# Patient Record
Sex: Female | Born: 1980 | Hispanic: No | Marital: Married | Smoking: Current every day smoker
Health system: Southern US, Community
[De-identification: ages and names within clinical notes are randomized; demographics above are authoritative.]

## PROBLEM LIST (undated history)

## (undated) ENCOUNTER — Inpatient Hospital Stay (HOSPITAL_COMMUNITY): Payer: Self-pay

## (undated) DIAGNOSIS — B999 Unspecified infectious disease: Secondary | ICD-10-CM

## (undated) DIAGNOSIS — I10 Essential (primary) hypertension: Secondary | ICD-10-CM

## (undated) DIAGNOSIS — R519 Headache, unspecified: Secondary | ICD-10-CM

## (undated) DIAGNOSIS — O139 Gestational [pregnancy-induced] hypertension without significant proteinuria, unspecified trimester: Secondary | ICD-10-CM

## (undated) DIAGNOSIS — R51 Headache: Secondary | ICD-10-CM

## (undated) HISTORY — PX: BREAST REDUCTION SURGERY: SHX8

---

## 2008-03-26 HISTORY — PX: REDUCTION MAMMAPLASTY: SUR839

## 2010-08-23 ENCOUNTER — Encounter: Payer: Self-pay | Admitting: Family Medicine

## 2010-08-31 ENCOUNTER — Inpatient Hospital Stay (INDEPENDENT_AMBULATORY_CARE_PROVIDER_SITE_OTHER)
Admission: RE | Admit: 2010-08-31 | Discharge: 2010-08-31 | Disposition: A | Discharge status: Home / Self Care | Payer: Medicaid Other | Source: Ambulatory Visit | Attending: Emergency Medicine | Admitting: Emergency Medicine

## 2010-08-31 ENCOUNTER — Other Ambulatory Visit: Payer: Self-pay | Admitting: Emergency Medicine

## 2010-08-31 DIAGNOSIS — N6313 Unspecified lump in the right breast, lower outer quadrant: Secondary | ICD-10-CM

## 2010-08-31 DIAGNOSIS — N644 Mastodynia: Secondary | ICD-10-CM

## 2010-09-06 ENCOUNTER — Ambulatory Visit
Admission: RE | Admit: 2010-09-06 | Discharge: 2010-09-06 | Disposition: A | Discharge status: Home / Self Care | Payer: Medicaid Other | Source: Ambulatory Visit | Attending: Emergency Medicine | Admitting: Emergency Medicine

## 2010-09-06 DIAGNOSIS — N6313 Unspecified lump in the right breast, lower outer quadrant: Secondary | ICD-10-CM

## 2010-09-06 IMAGING — MG MM DIGITAL DIAGNOSTIC BILAT CAD
8 of 9 series · 8 of 9 positions shown · non-contrast
Comparison: None.

CLINICAL DATA: Patient presents for bilateral diagnostic mammogram
due to two areas of focal pain over the outer lower right breast.
The patient underwent bilateral reduction mammoplasty in [AV].  The
patient has been experiencing breast pain for the past 3-6 months.

DIGITAL DIAGNOSTIC BILATERAL MAMMOGRAM WITH CAD AND RIGHT BREAST
ULTRASOUND:

[R CC (1 of 3)]
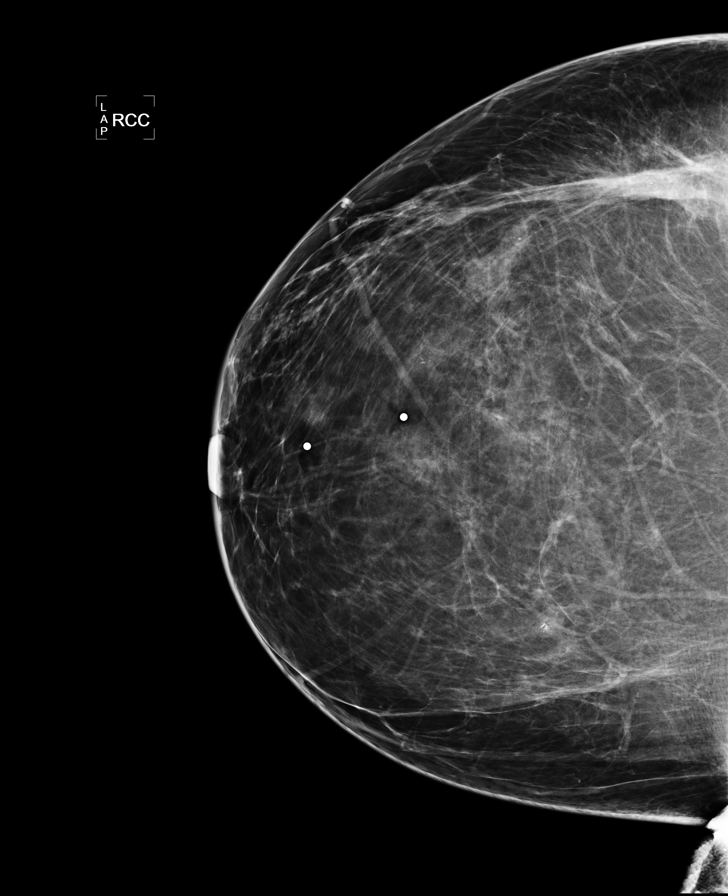

[L CC]
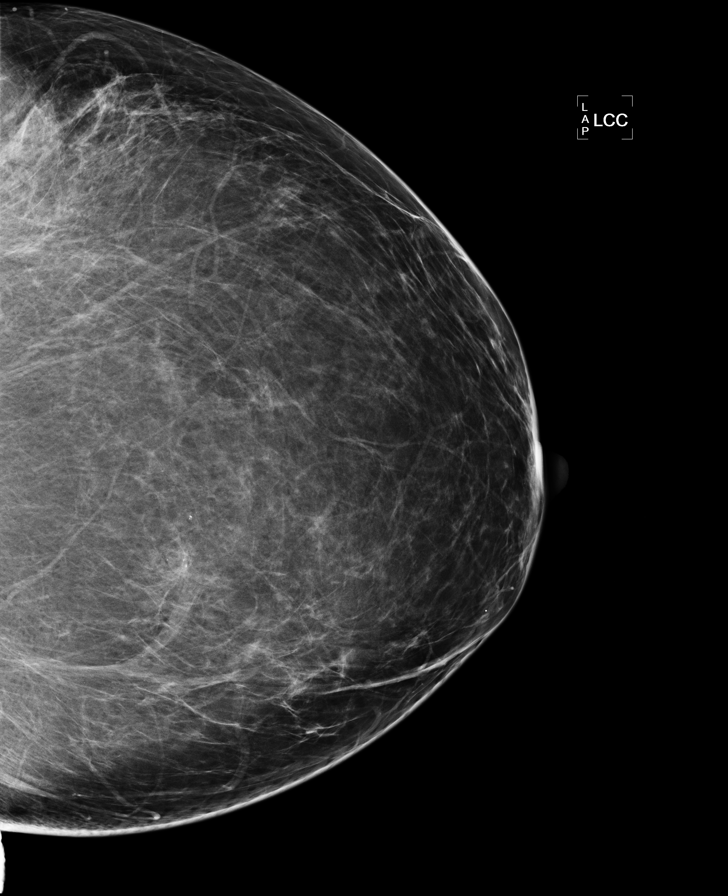

[L MLO]
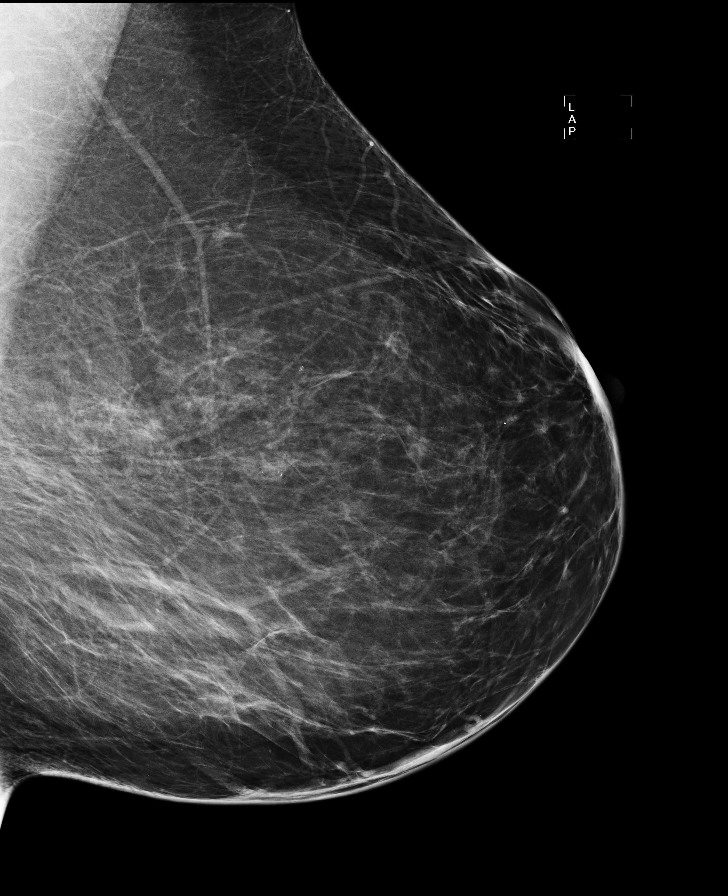

[R MLO]
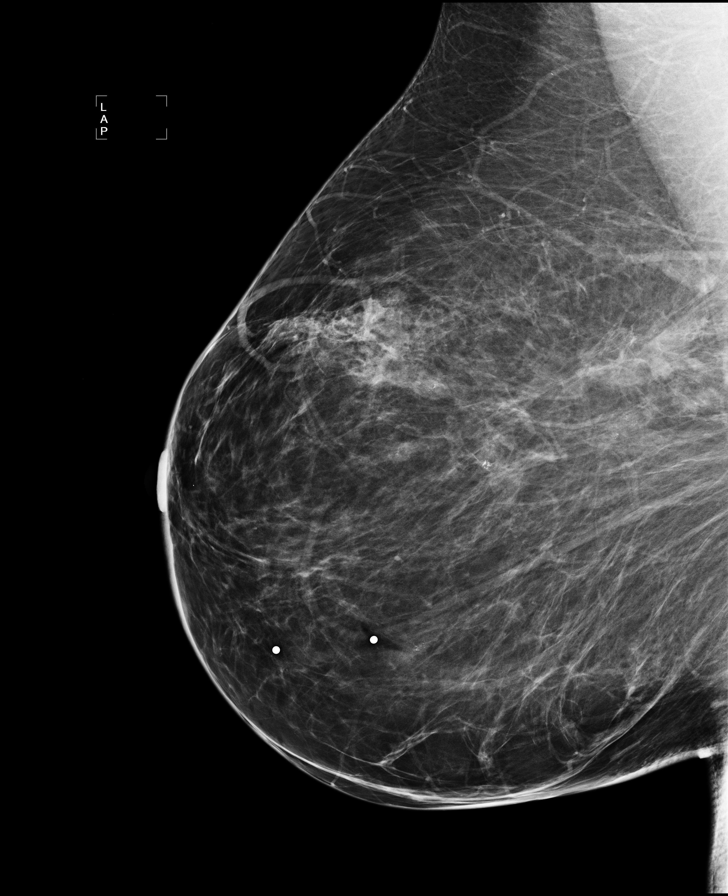

[R TAN]
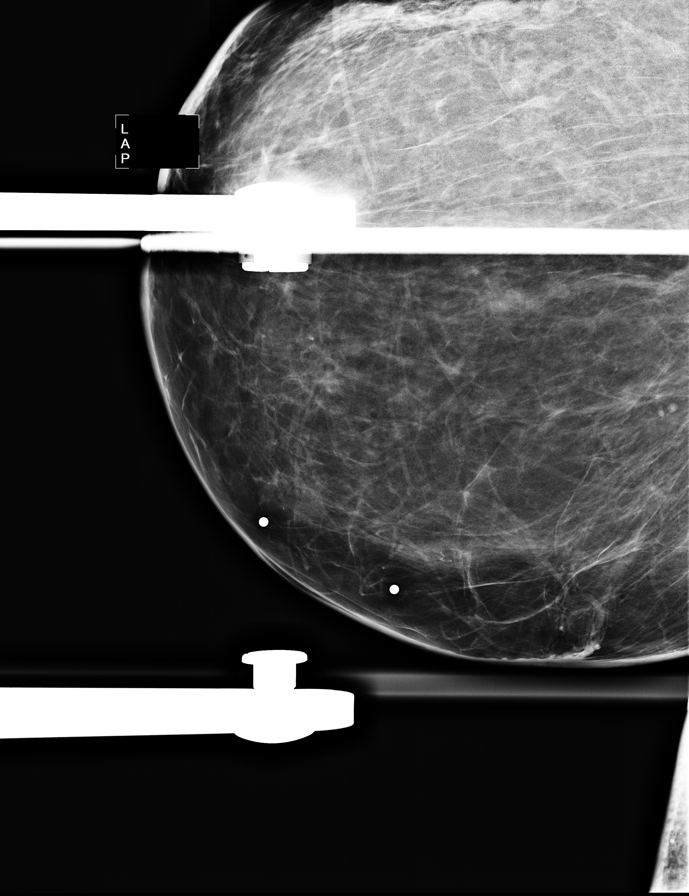

[R CC (2 of 3)]
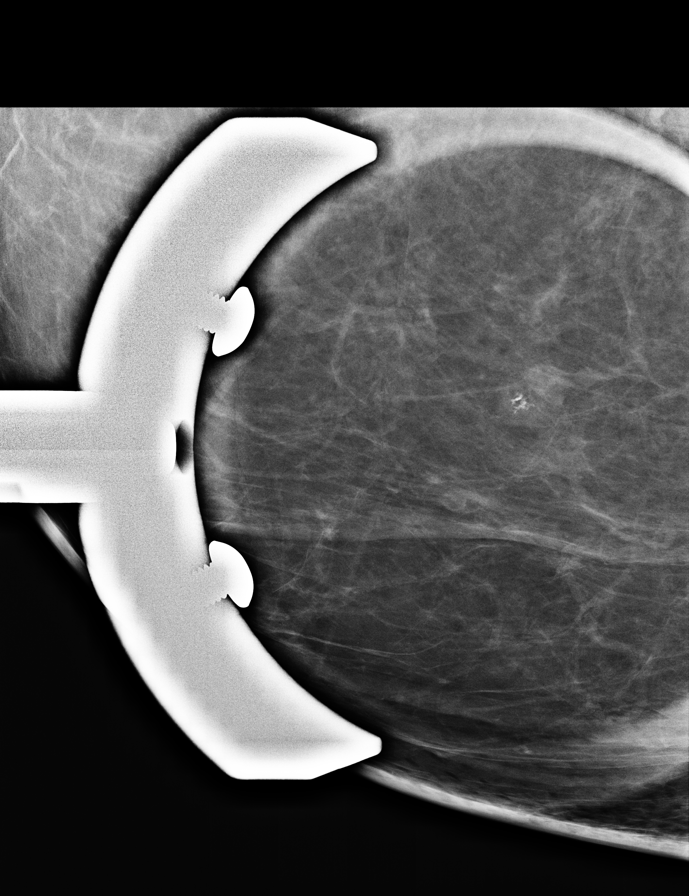

[R ML]
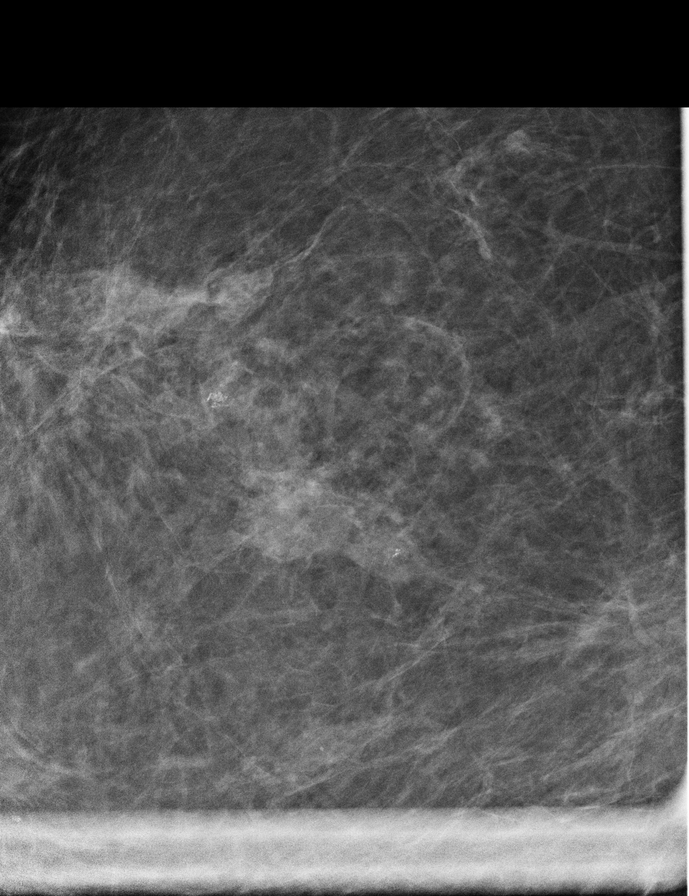

[R CC (3 of 3)]
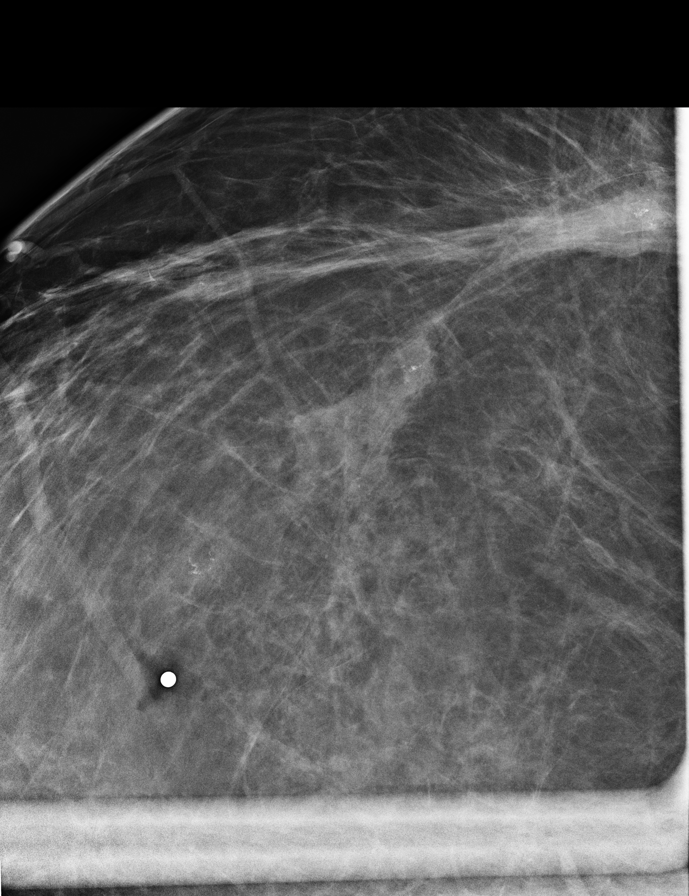

[8 of 9 positions shown; findings below may reference images not displayed]

FINDINGS: Exam demonstrates scattered fibroglandular densities.
There are changes likely post surgical scarring from the patient's
prior reduction mammoplasty bilaterally.  There is no definite
focal abnormality over the lower mid to outer right breast in the
area of patient's palpable abnormalities.  There are a few groups
of microcalcifications in the right breast along the reduction scar
likely early fat necrosis.

Ultrasound is performed, showing an ovoid hypoechoic nodule
centered over the deep layer of the skin at the 7 o'clock position
12 cm from the nipple corresponding to one of patient's palpable
abnormalities.  This is likely dermatologic origin and measures
x 0.9 x 1.2 cm.  Ultrasound over the patient's second palpable
abnormality at the 7 o'clock position 6-7 cm from the nipple
demonstrates an ovoid hypoechoic nodule measuring 0.5 x 1.1 x
cm abutting the deep layer of the skin.  This likely represents a
benign process such as a fibroadenoma.
IMPRESSION: Several group of microcalcifications within the right breast likely
early fat necrosis along the patient's reduction mammoplasty scar.
Two small hypoechoic nodules as described at the 7 o'clock position
also likely benign.

Recommendations:  Recommend a 6-month follow-up diagnostic right
breast mammogram and ultrasound to document stability of these
probable benign findings.  Also recommend continued management of
patient's right breast pain on a clinical basis.

BI-RADS CATEGORY 3:  Probably benign finding(s) - short interval
follow-up suggested.

## 2010-09-06 IMAGING — US US BREAST*R*
1 series · 8 of 8 positions shown · non-contrast
Comparison: None.

CLINICAL DATA: Patient presents for bilateral diagnostic mammogram
due to two areas of focal pain over the outer lower right breast.
The patient underwent bilateral reduction mammoplasty in [AV].  The
patient has been experiencing breast pain for the past 3-6 months.

DIGITAL DIAGNOSTIC BILATERAL MAMMOGRAM WITH CAD AND RIGHT BREAST
ULTRASOUND:

[Series 1: us breast*right* · 8 of 8 slices shown]
[im 1/8]
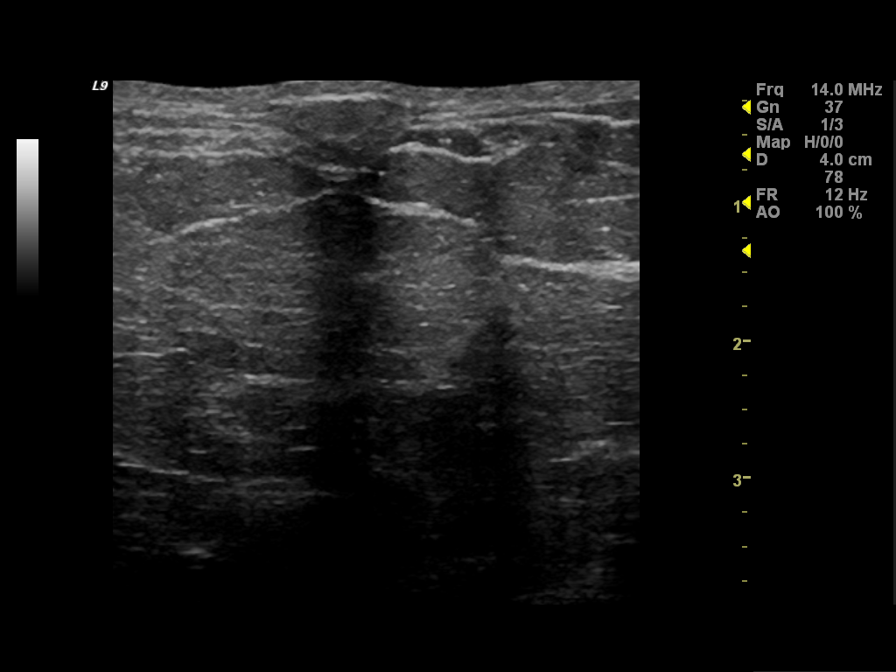
[im 2/8]
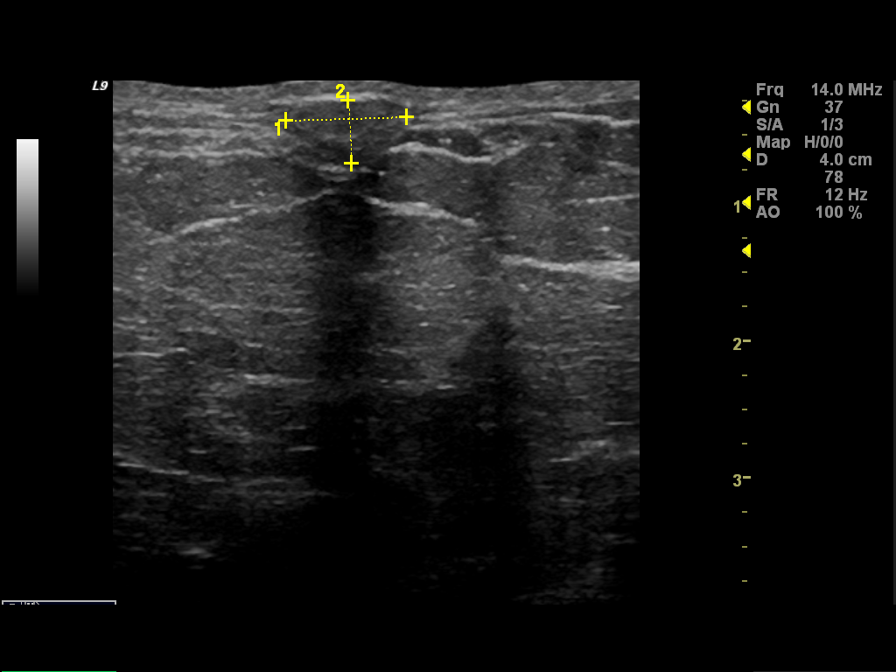
[im 3/8]
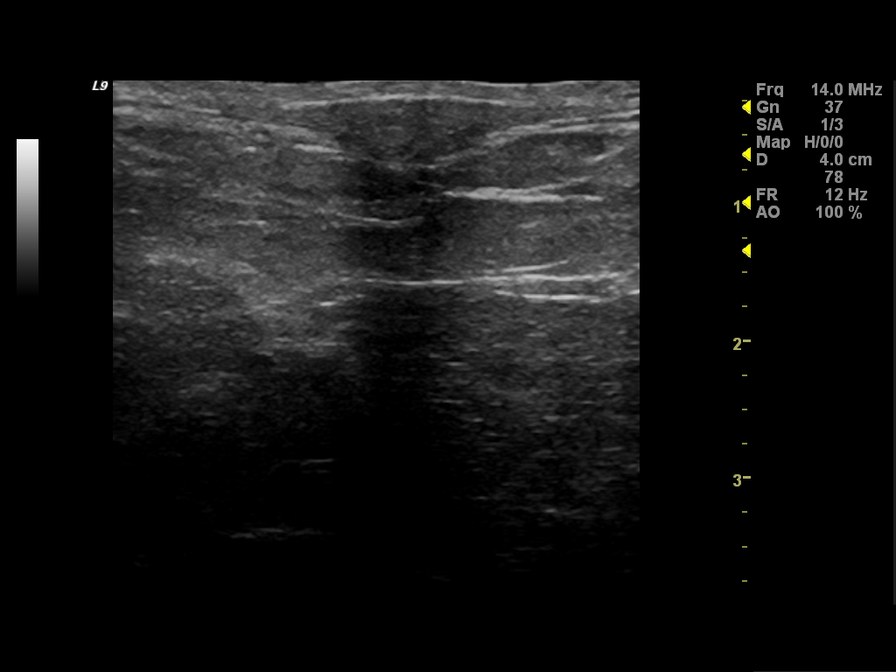
[im 4/8]
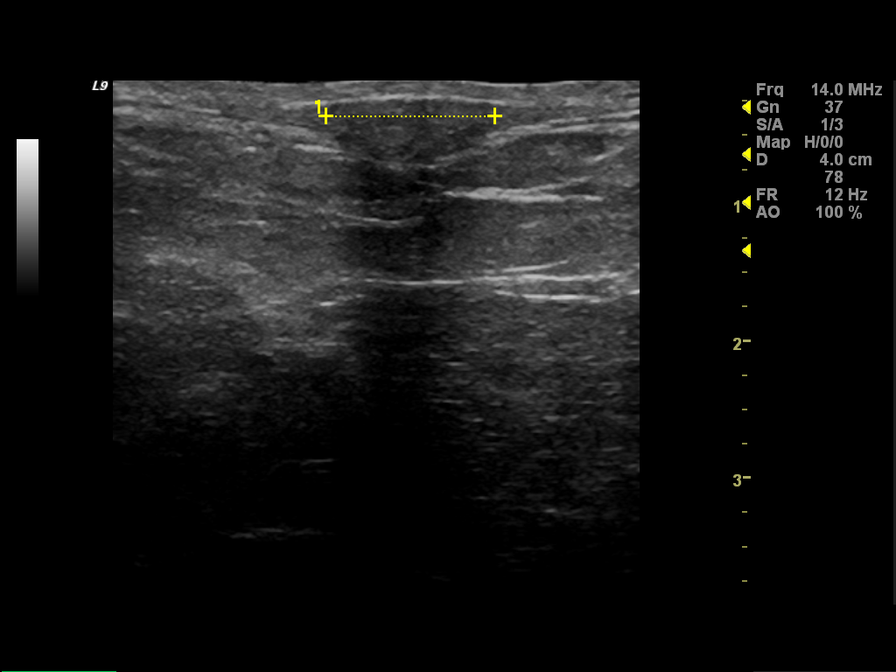
[im 5/8]
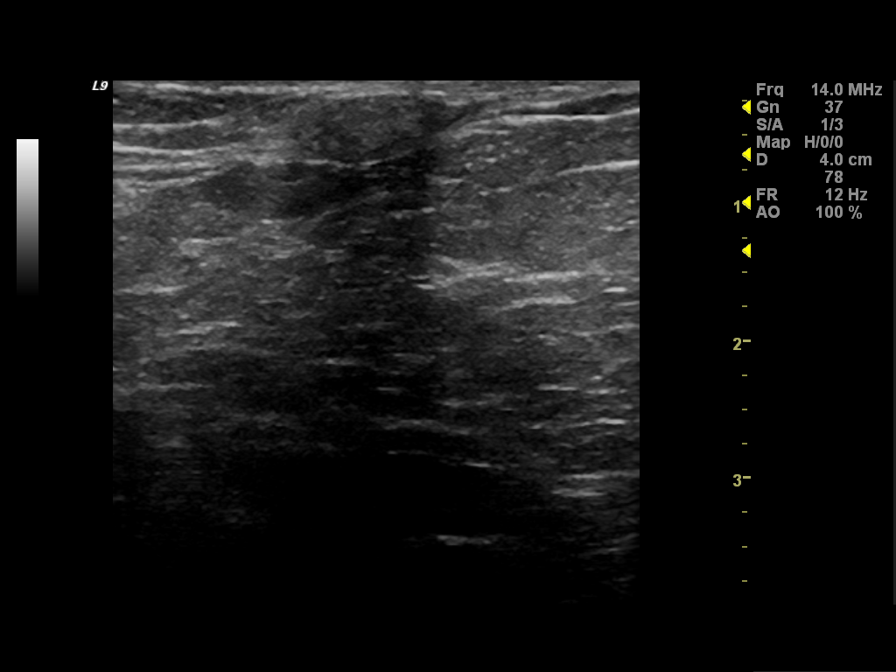
[im 6/8]
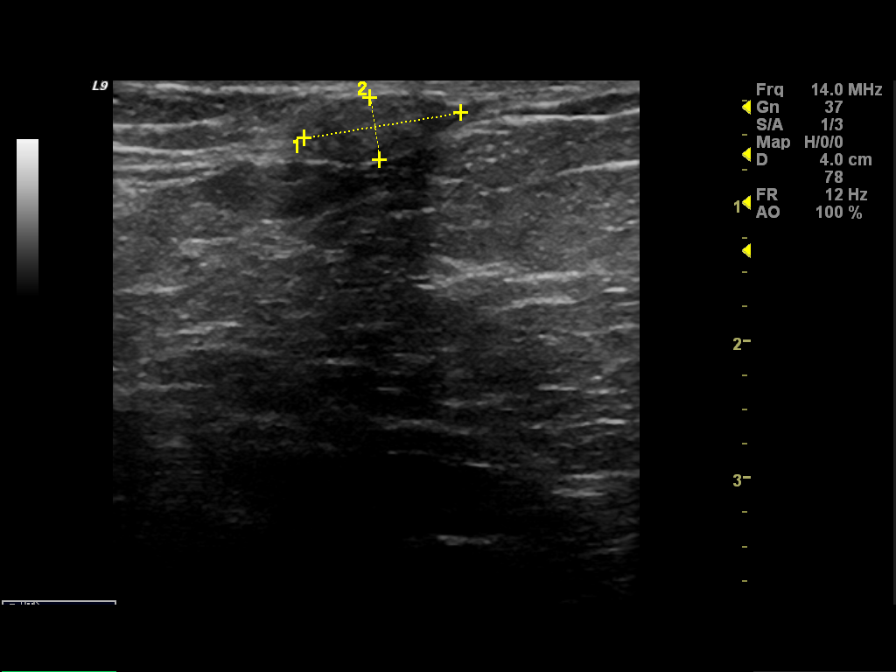
[im 7/8]
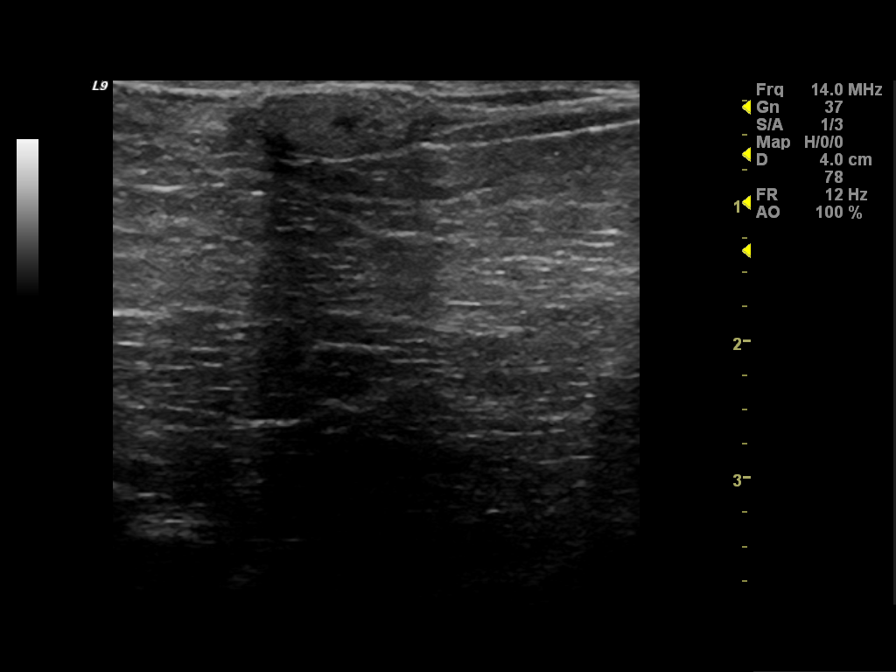
[im 8/8]
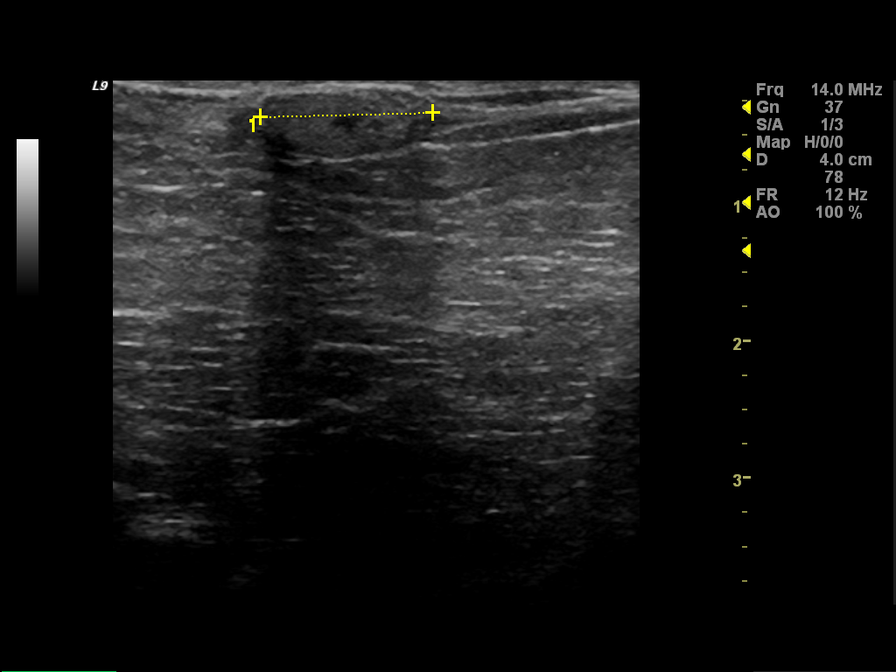

[8 of 8 positions shown; findings below may reference images not displayed]

FINDINGS: Exam demonstrates scattered fibroglandular densities.
There are changes likely post surgical scarring from the patient's
prior reduction mammoplasty bilaterally.  There is no definite
focal abnormality over the lower mid to outer right breast in the
area of patient's palpable abnormalities.  There are a few groups
of microcalcifications in the right breast along the reduction scar
likely early fat necrosis.

Ultrasound is performed, showing an ovoid hypoechoic nodule
centered over the deep layer of the skin at the 7 o'clock position
12 cm from the nipple corresponding to one of patient's palpable
abnormalities.  This is likely dermatologic origin and measures
x 0.9 x 1.2 cm.  Ultrasound over the patient's second palpable
abnormality at the 7 o'clock position 6-7 cm from the nipple
demonstrates an ovoid hypoechoic nodule measuring 0.5 x 1.1 x
cm abutting the deep layer of the skin.  This likely represents a
benign process such as a fibroadenoma.
IMPRESSION: Several group of microcalcifications within the right breast likely
early fat necrosis along the patient's reduction mammoplasty scar.
Two small hypoechoic nodules as described at the 7 o'clock position
also likely benign.

Recommendations:  Recommend a 6-month follow-up diagnostic right
breast mammogram and ultrasound to document stability of these
probable benign findings.  Also recommend continued management of
patient's right breast pain on a clinical basis.

BI-RADS CATEGORY 3:  Probably benign finding(s) - short interval
follow-up suggested.

## 2011-02-02 ENCOUNTER — Other Ambulatory Visit: Payer: Self-pay | Admitting: Emergency Medicine

## 2011-02-02 DIAGNOSIS — R922 Inconclusive mammogram: Secondary | ICD-10-CM

## 2011-02-06 ENCOUNTER — Emergency Department (HOSPITAL_COMMUNITY)
Admission: EM | Admit: 2011-02-06 | Discharge: 2011-02-07 | Discharge status: Against Medical Advice | Payer: Medicaid Other | Attending: Emergency Medicine | Admitting: Emergency Medicine

## 2011-02-06 ENCOUNTER — Encounter (HOSPITAL_COMMUNITY): Payer: Self-pay | Admitting: *Deleted

## 2011-02-06 ENCOUNTER — Emergency Department (INDEPENDENT_AMBULATORY_CARE_PROVIDER_SITE_OTHER)
Admission: EM | Admit: 2011-02-06 | Discharge: 2011-02-06 | Disposition: A | Discharge status: Other Acute Inpatient Hospital | Payer: Medicaid Other | Source: Home / Self Care | Attending: Emergency Medicine | Admitting: Emergency Medicine

## 2011-02-06 ENCOUNTER — Encounter: Payer: Self-pay | Admitting: *Deleted

## 2011-02-06 DIAGNOSIS — R51 Headache: Secondary | ICD-10-CM

## 2011-02-06 HISTORY — DX: Essential (primary) hypertension: I10

## 2011-02-06 MED ORDER — HYDROCODONE-ACETAMINOPHEN 5-325 MG PO TABS
1.0000 | ORAL_TABLET | Freq: Once | ORAL | Status: AC
  Administered 2011-02-06: 1 via ORAL

## 2011-02-06 MED ORDER — HYDROCODONE-ACETAMINOPHEN 5-325 MG PO TABS
ORAL_TABLET | ORAL | Status: AC
  Filled 2011-02-06: qty 1

## 2011-02-06 NOTE — ED Notes (Addendum)
C/o HA, ongoing for ~ 2 months, worse today, also dizzy, nv, last emesis yesterday, sob, (denies: facial pain, nasal congestion, diarrhea or fever), some temporary relief with tylenol (last yesterday) & advi (last "weeks ago"). Cough noted, also mentions sore throat. LS CTA, throat appears unremarkable & "feels better b/c of medicine".

## 2011-02-06 NOTE — ED Notes (Signed)
Headache        Pt  Reports     She  Has  Had  Headache s      For     8  Months

## 2011-02-06 NOTE — ED Notes (Signed)
Pt  Has  hadf  History  Of high  bp   In past  Stopped  Taking  meds   7-8  Months  Ago

## 2011-02-06 NOTE — ED Provider Notes (Signed)
History     CSN: [id number] Arrival date & time: 02/06/2011  6:02 PM   First MD Initiated Contact with Patient 02/06/11 1633      Chief Complaint  Patient presents with  . Headache    (Consider location/radiation/quality/duration/timing/severity/associated sxs/prior treatment) HPI Comments: Pt presents with c/o HA. She states the HAs began approx 3 yrs ago. Was told then that the HAs were due to HTN and began taking BP medication. The HAs continued daily and pt is vague as to whether she actually noticed improvement or not while taking BP medication. She relocated to [facility] from [place] 10 mos ago. She states she ran out of BP meds 7-8 mos ago and did not find a PCP locally. The HAs have worsened in the last 2 mos. She states they are daily, persistent, with some temporary relief with otc pain relievers. She has nausea but no vomiting. Denies dizziness, blurred or dble vision, or parasthesias. Pt was concerned that her BP was elevated causing her headaches. She has not had any work up or imaging studies for her HAs and has not been seen for her HAs in the last 3 yrs.   Patient is a 30 y.o. female presenting with headaches. The history is provided by the patient.  Headache The primary symptoms include headaches and nausea. Primary symptoms do not include syncope, loss of consciousness, altered mental status, dizziness, visual change, paresthesias, focal weakness, loss of sensation, speech change, memory loss, fever or vomiting. Episode onset: 3 yrs ago, worsening last 2 mos. Episode duration: daily   The headache is not associated with eye pain, visual change, paresthesias or weakness.  Additional symptoms do not include weakness. Medical issues also include hypertension. Workup history does not include MRI or CT scan.    Past Medical History  Diagnosis Date  . Hypertension     Past Surgical History  Procedure Date  . Breast reduction surgery     Family History  Problem Relation  Age of Onset  . Hypertension Mother   . Diabetes Mother     History  Substance Use Topics  . Smoking status: Never Smoker   . Smokeless tobacco: Not on file  . Alcohol Use: No    OB History    Grav Para Term Preterm Abortions TAB SAB Ect Mult Living                  Review of Systems  Constitutional: Negative for fever, chills and fatigue.  HENT: Negative for ear pain, congestion, rhinorrhea, sneezing and neck pain.   Eyes: Negative for pain and visual disturbance.  Respiratory: Negative for cough and shortness of breath.   Cardiovascular: Negative for chest pain, palpitations and syncope.  Gastrointestinal: Positive for nausea. Negative for vomiting and abdominal pain.  Neurological: Positive for headaches. Negative for dizziness, speech change, focal weakness, loss of consciousness, weakness, numbness and paresthesias.  Psychiatric/Behavioral: Negative for memory loss and altered mental status.    Allergies  Review of patient's allergies indicates no known allergies.  Home Medications   Current Outpatient Rx  Name Route Sig Dispense Refill  . ACETAMINOPHEN 325 MG PO TABS Oral Take 650 mg by mouth every 6 (six) hours as needed.        BP 137/92  Pulse 74  Temp(Src) 98.6 F (37 C) (Oral)  Resp 16  SpO2 99%  LMP 01/31/2011  Physical Exam  Nursing note and vitals reviewed. Constitutional: She appears well-developed and well-nourished. No distress.  HENT:  Head: Normocephalic and atraumatic.  Right Ear: Tympanic membrane, external ear and ear canal normal.  Left Ear: Tympanic membrane, external ear and ear canal normal.  Nose: Nose normal.  Mouth/Throat: Uvula is midline, oropharynx is clear and moist and mucous membranes are normal. No oropharyngeal exudate, posterior oropharyngeal edema or posterior oropharyngeal erythema.  Eyes: Conjunctivae, EOM and lids are normal. Pupils are equal, round, and reactive to light.  Fundoscopic exam:      The right eye shows  no AV nicking, no hemorrhage and no papilledema.       The left eye shows no AV nicking, no hemorrhage and no papilledema.  Neck: Neck supple.  Cardiovascular: Normal rate, regular rhythm and normal heart sounds.   Pulmonary/Chest: Effort normal and breath sounds normal. No respiratory distress.  Lymphadenopathy:    She has no cervical adenopathy.  Neurological: She is alert. She has normal strength. No cranial nerve deficit.  Skin: Skin is warm and dry.  Psychiatric: Her speech is normal and behavior is normal. Her mood appears anxious.    ED Course  Procedures (including critical care time)  Labs Reviewed - No data to display No results found.   1. Headache       MDM  Transferred to [facility] for imaging and further eval of HA due to worsening of symptoms.        Melody Comas, [place] 02/06/11 1904

## 2011-02-07 NOTE — ED Provider Notes (Signed)
Medical screening examination/treatment/procedure(s) were performed by non-physician practitioner and as supervising physician I was immediately available for consultation/collaboration.  Luiz Blare MD   Danella Maiers Taleigh Gero 02/07/11 1420

## 2011-02-27 ENCOUNTER — Telehealth (HOSPITAL_COMMUNITY): Payer: Self-pay | Admitting: *Deleted

## 2011-02-27 NOTE — ED Notes (Signed)
Order for 6 mos. f/u mammogram for R breast density faxed to [place] from [facility]. Dr. Ladon Applebaum reviewed pt.'s chart and previous Mammogram result.  He signed the order Dr. Ladon Applebaum  and said to call pt. and explain emphatically that we are not a primary care facility.  She needs to establish with a PCP. [facility] and explained  that we are not the patients PCP. I explained that we told patient to f[facility] with [facility]. We would do it one time due to seriousness of problem. I told them I would call pt. and try to explain this to her.

## 2011-03-01 ENCOUNTER — Other Ambulatory Visit: Payer: Medicaid Other

## 2011-03-01 ENCOUNTER — Inpatient Hospital Stay: Admission: RE | Admit: 2011-03-01 | Payer: Medicaid Other | Source: Ambulatory Visit

## 2011-04-03 ENCOUNTER — Other Ambulatory Visit: Payer: Medicaid Other

## 2011-09-03 ENCOUNTER — Ambulatory Visit
Admission: RE | Admit: 2011-09-03 | Discharge: 2011-09-03 | Disposition: A | Discharge status: Home / Self Care | Payer: Medicaid Other | Source: Ambulatory Visit | Attending: Physician Assistant | Admitting: Physician Assistant

## 2011-09-03 ENCOUNTER — Other Ambulatory Visit: Payer: Self-pay | Admitting: Physician Assistant

## 2011-09-03 DIAGNOSIS — M542 Cervicalgia: Secondary | ICD-10-CM

## 2011-09-03 DIAGNOSIS — M79601 Pain in right arm: Secondary | ICD-10-CM

## 2011-09-03 IMAGING — CR DG THORACIC SPINE 3V
3 series · 3 of 3 positions shown · non-contrast
Comparison: None.

CLINICAL DATA: Neck pain radiating to the right arm, no injury

THORACIC SPINE - 2 VIEW + SWIMMERS

[view not recorded (1 of 3)]
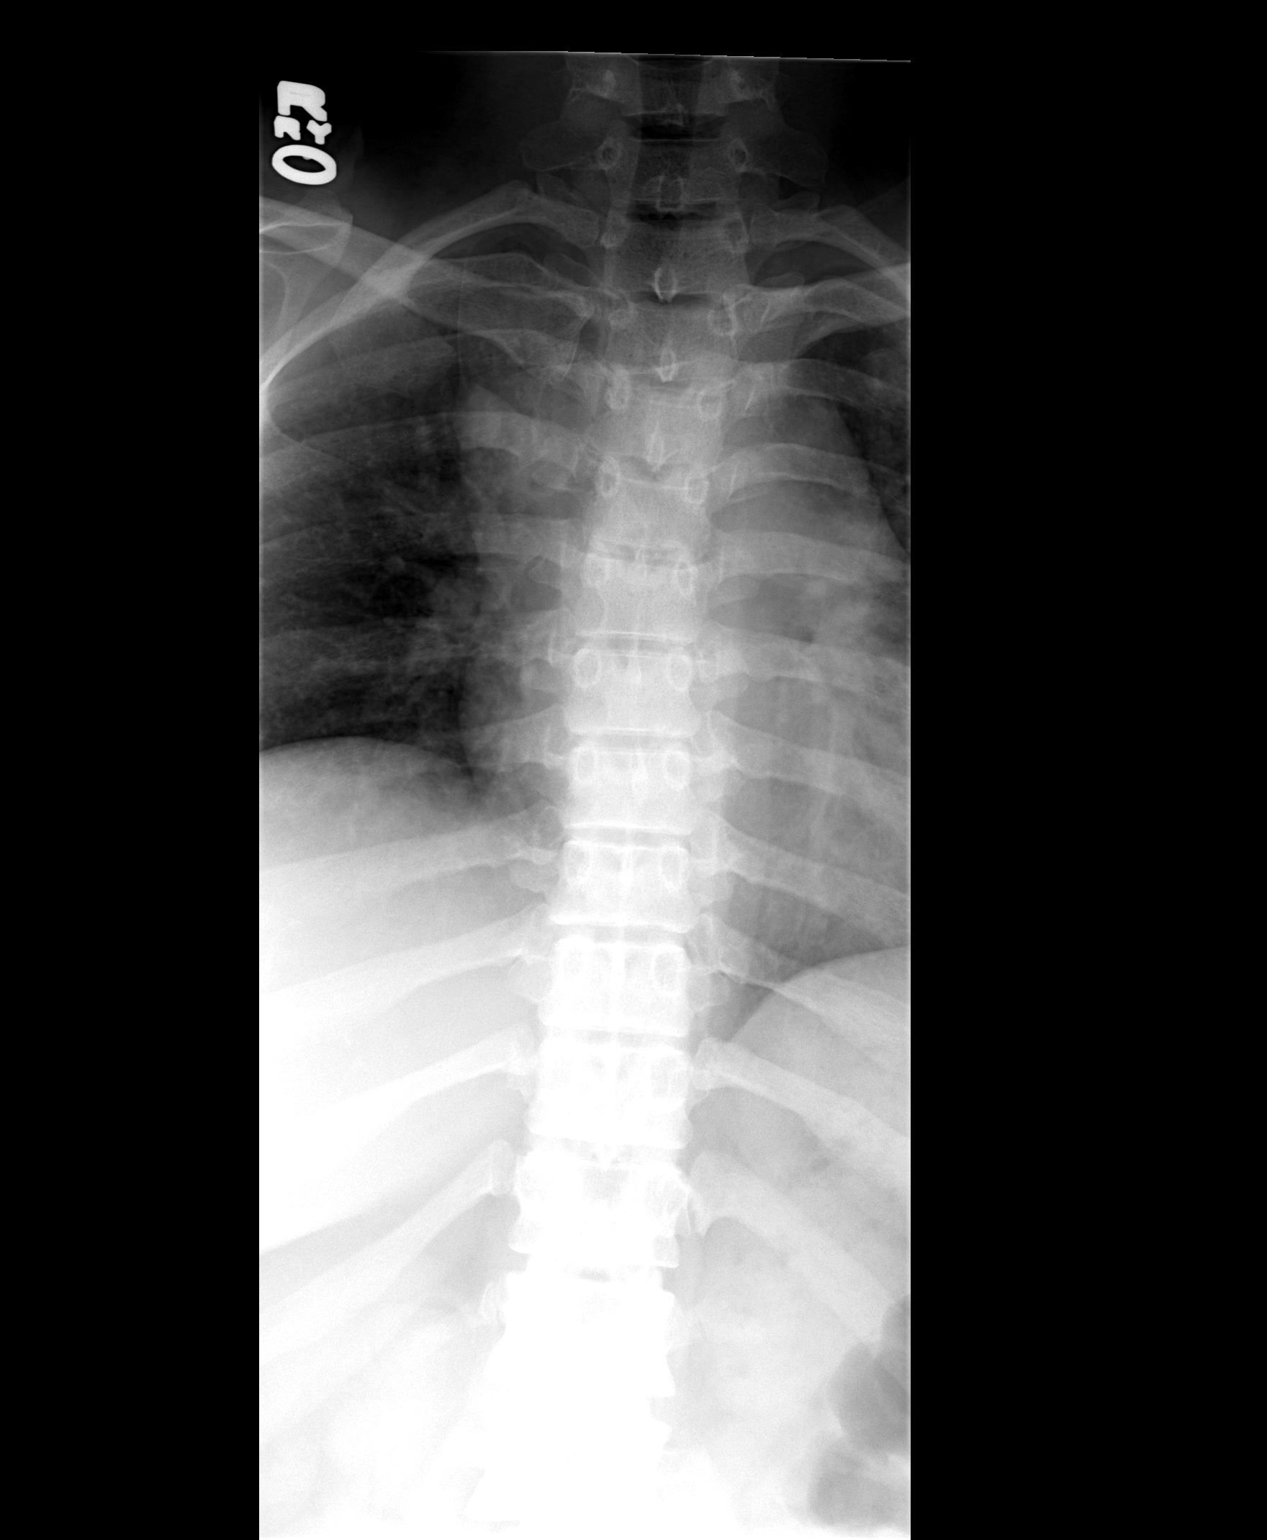

[view not recorded (2 of 3)]
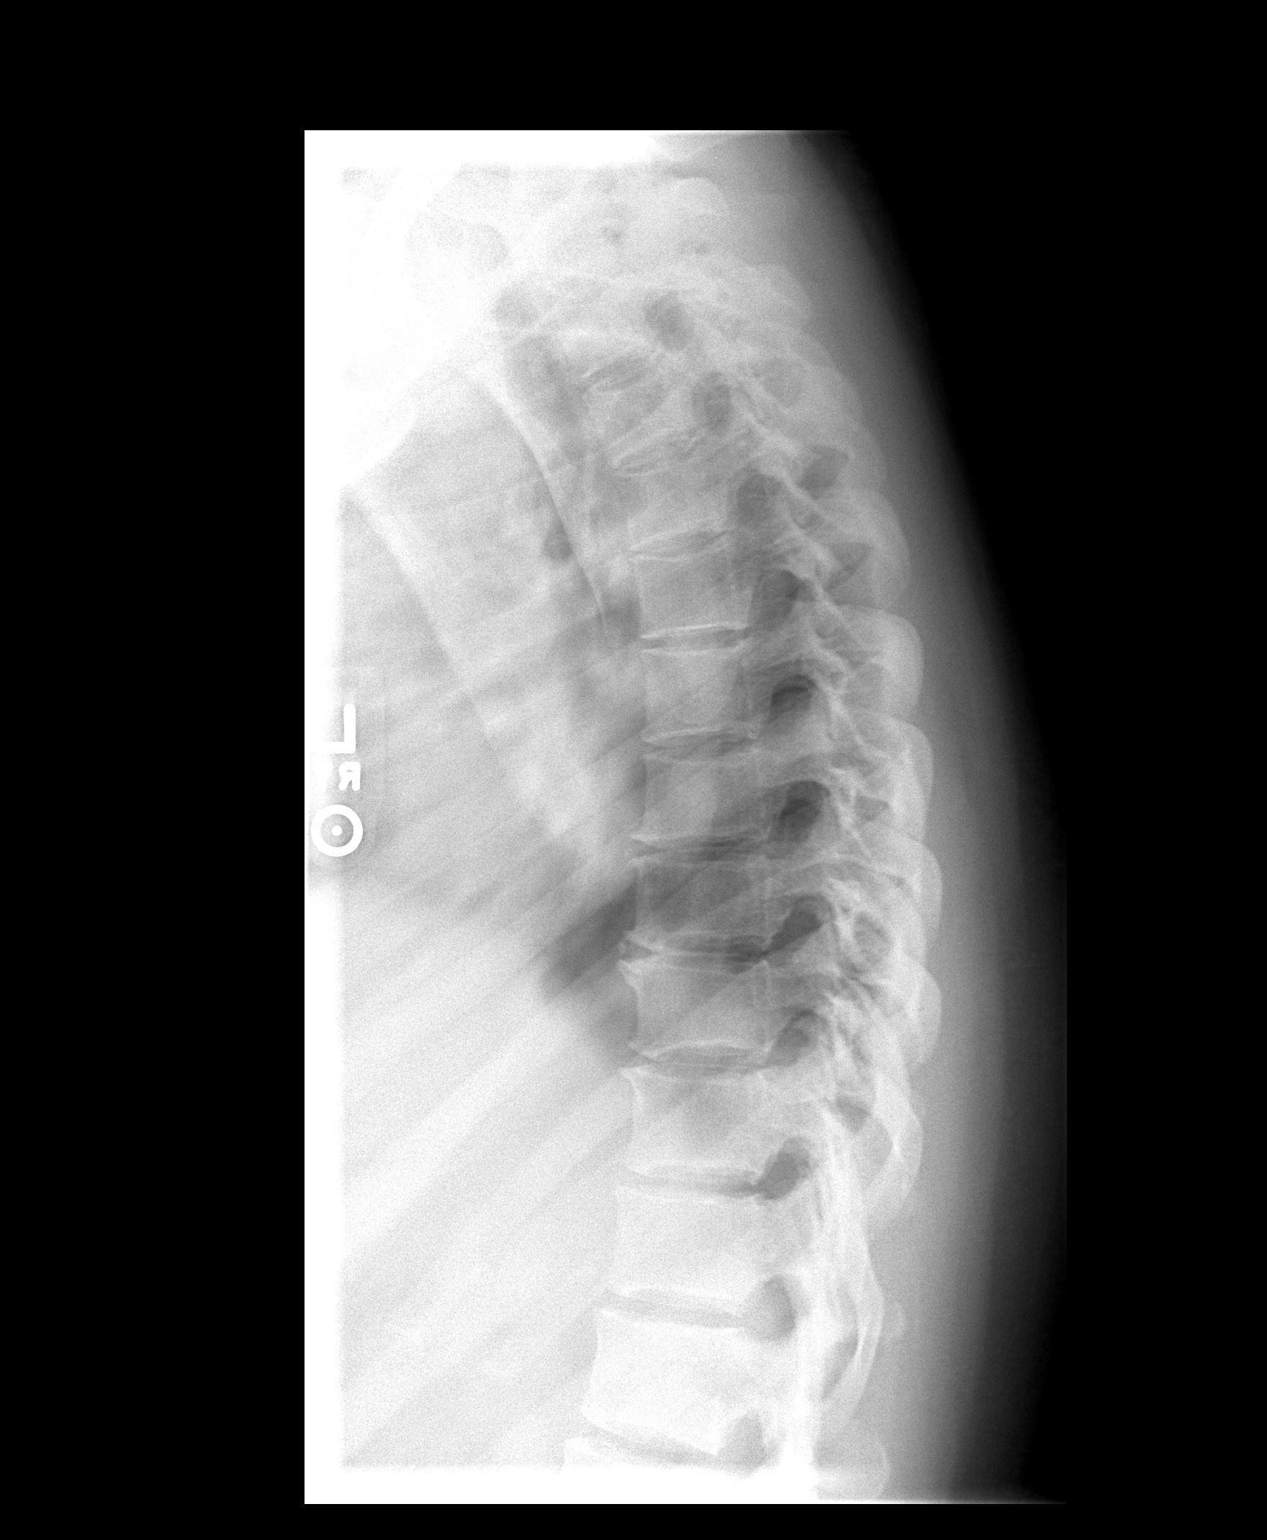

[view not recorded (3 of 3)]
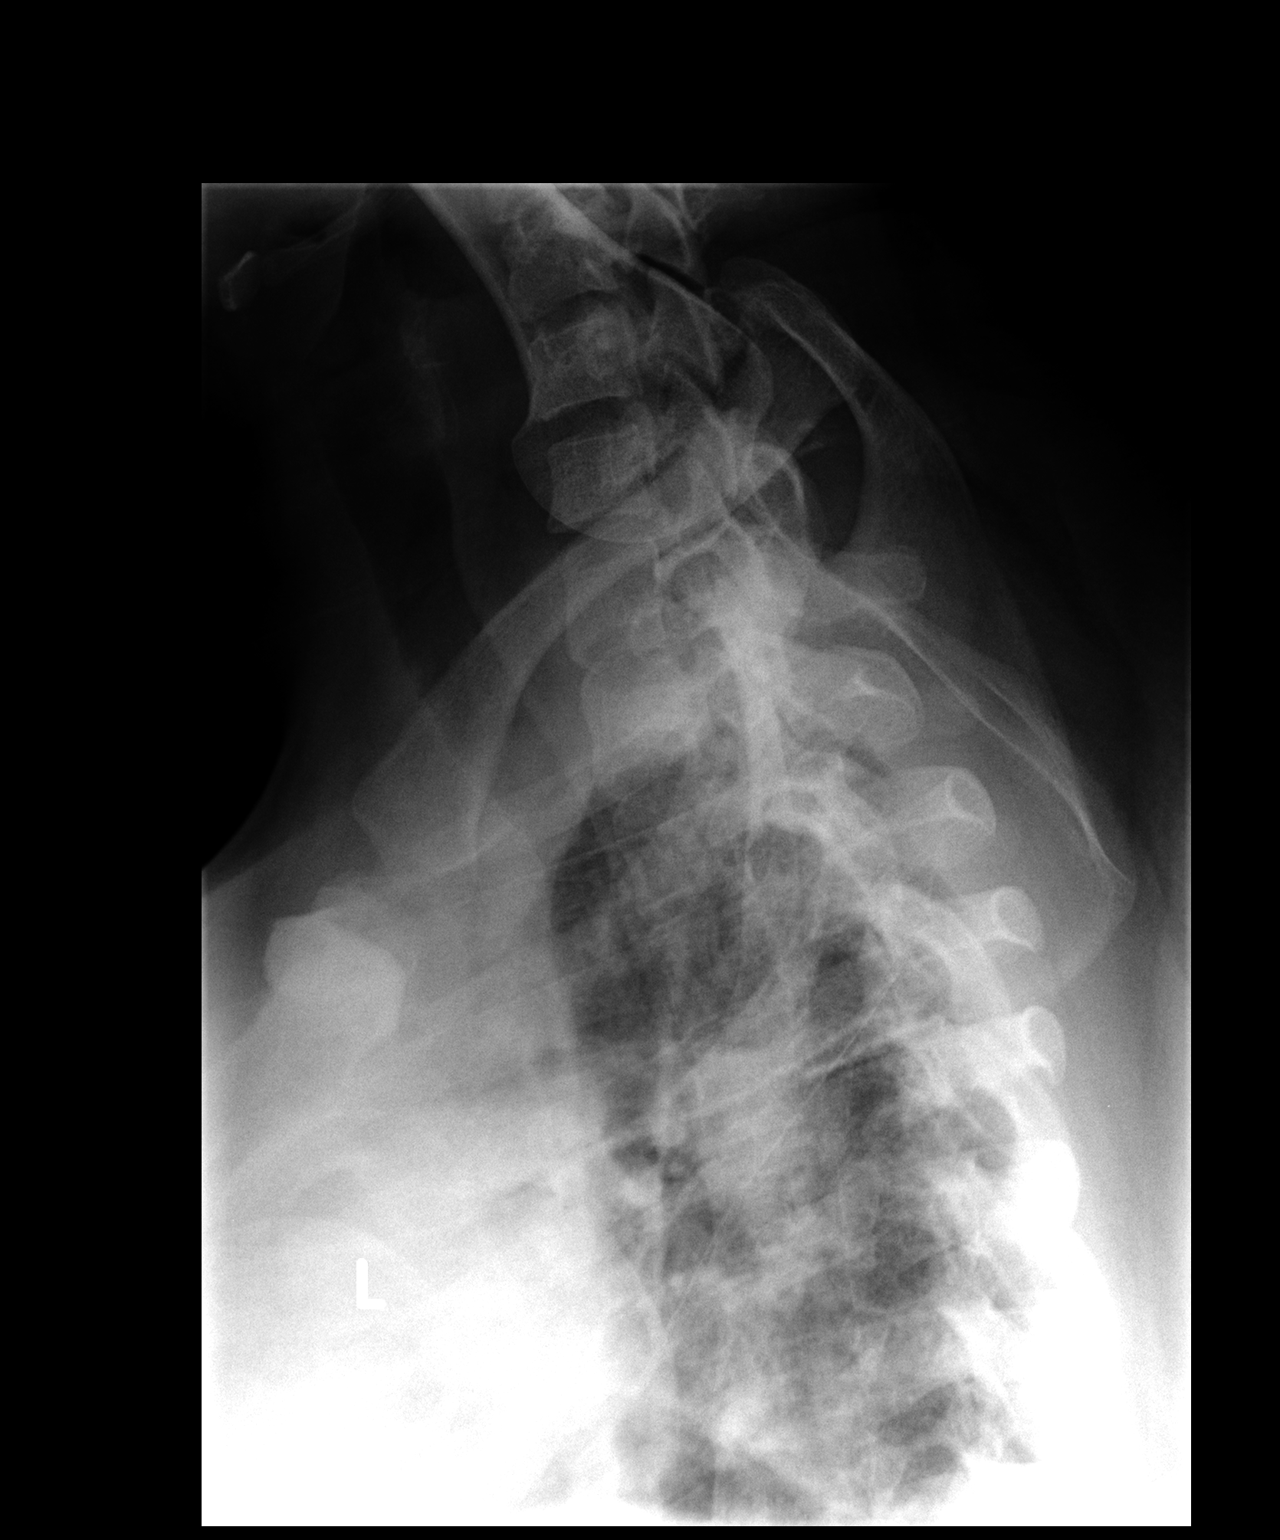

[3 of 3 positions shown; findings below may reference images not displayed]

FINDINGS: The thoracic vertebrae are in normal alignment.
Intervertebral disc spaces appear normal.  Only mild anterior
osteophyte formation is present.  No paravertebral soft tissue
swelling is seen.
IMPRESSION: Normal alignment with no acute abnormality.  Minimal degenerative
spurring.

## 2011-09-03 IMAGING — CR DG CERVICAL SPINE COMPLETE 4+V
5 series · 5 of 5 positions shown · non-contrast
Comparison: None.

CLINICAL DATA: Right neck and upper back pain radiating to the
right arm, no injury

CERVICAL SPINE - COMPLETE 4+ VIEW

[view not recorded (1 of 5)]
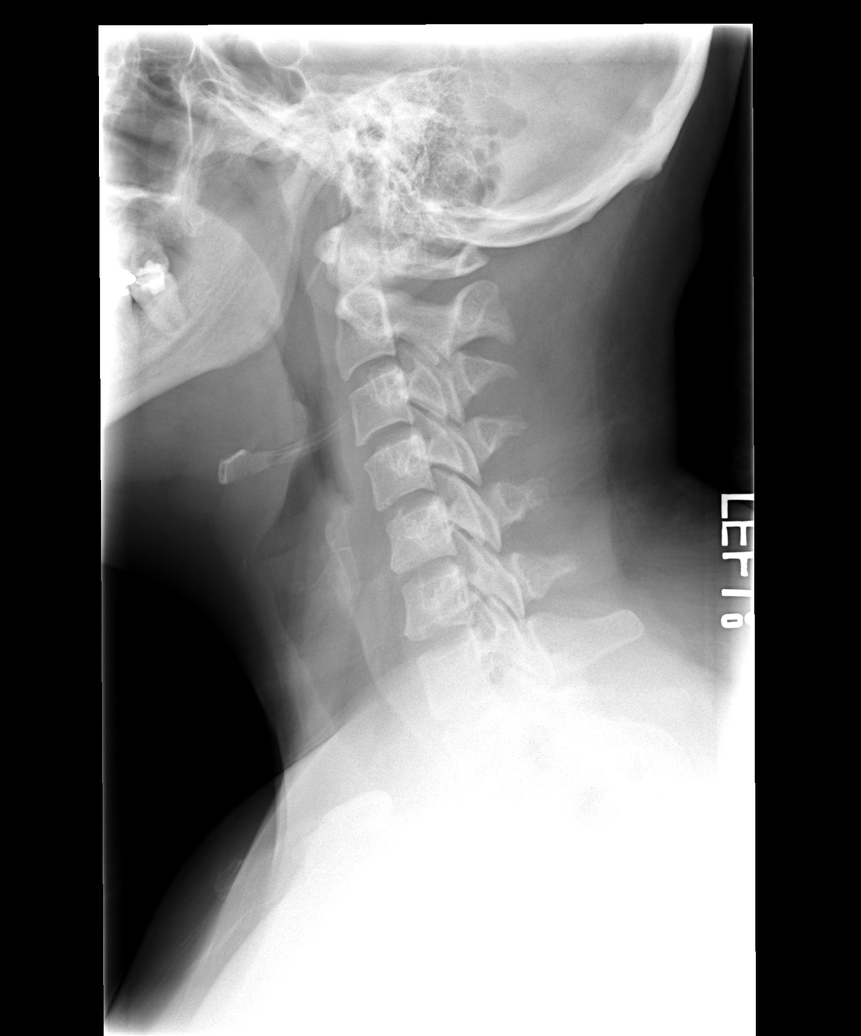

[view not recorded (2 of 5)]
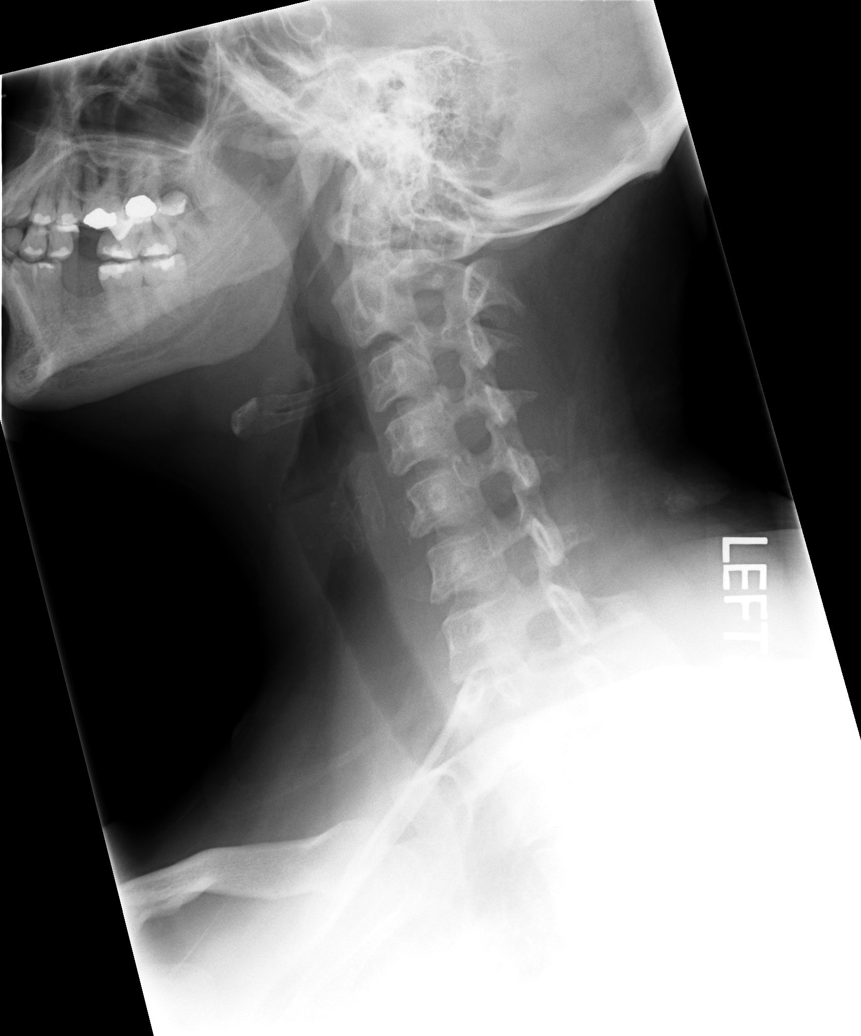

[view not recorded (3 of 5)]
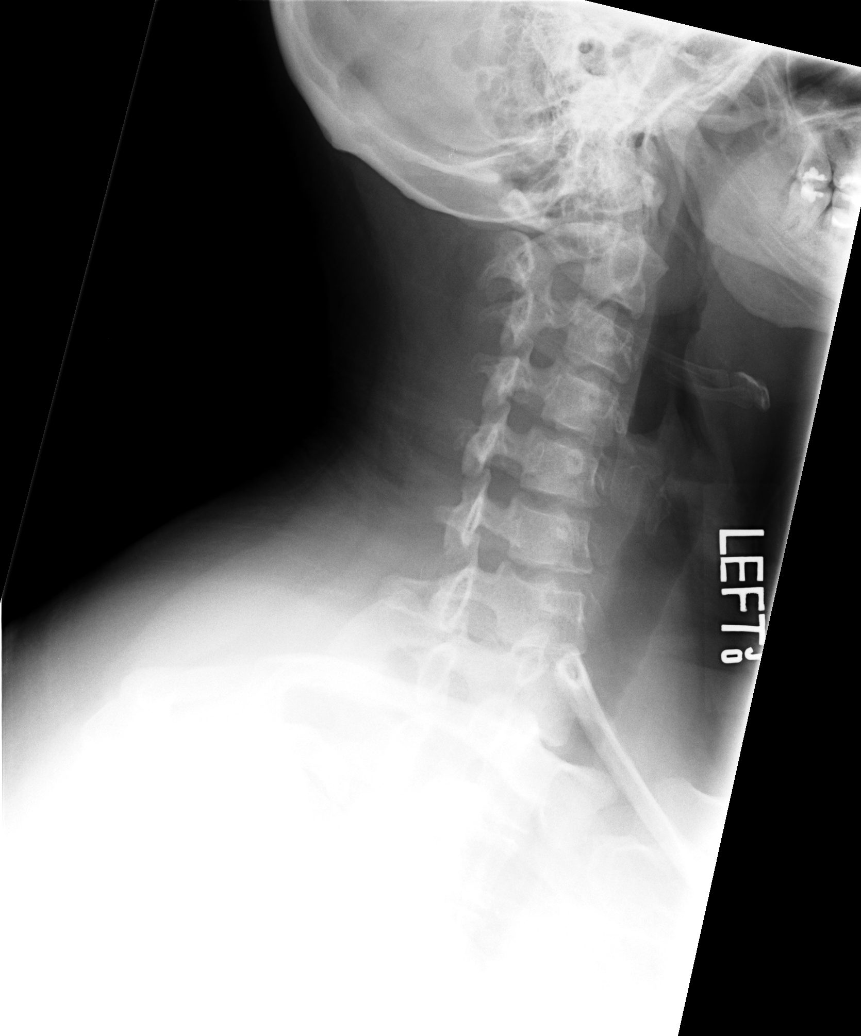

[view not recorded (4 of 5)]
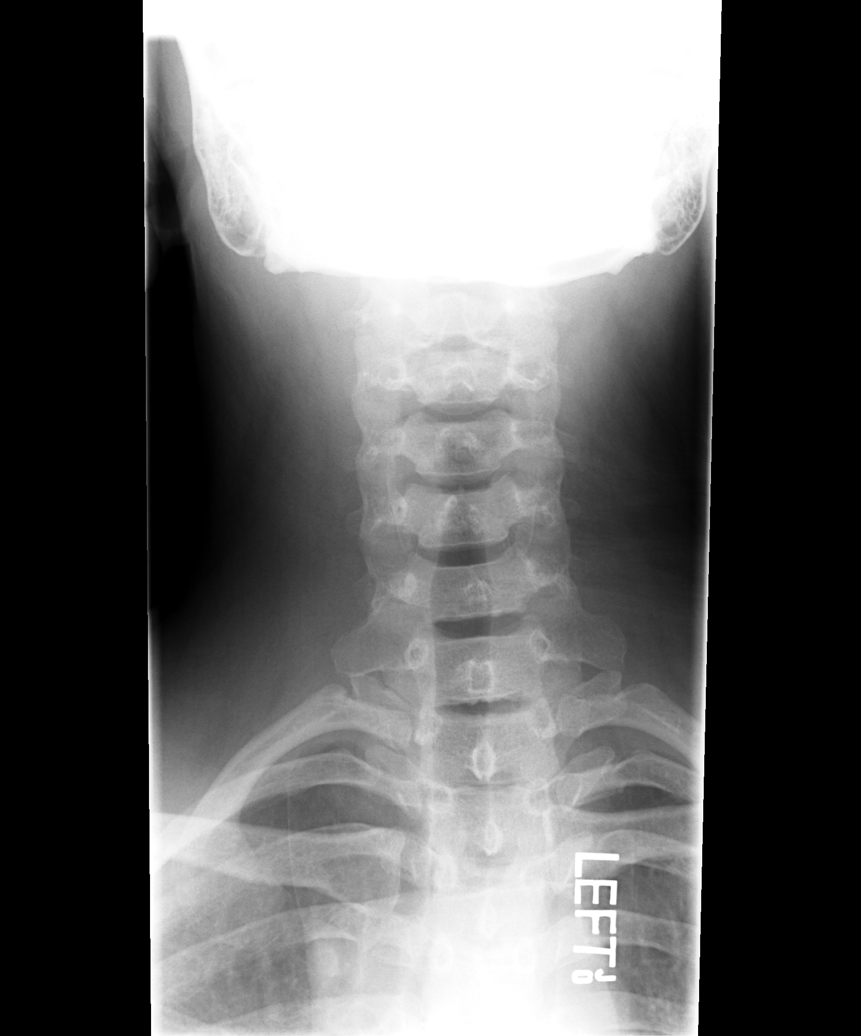

[view not recorded (5 of 5)]
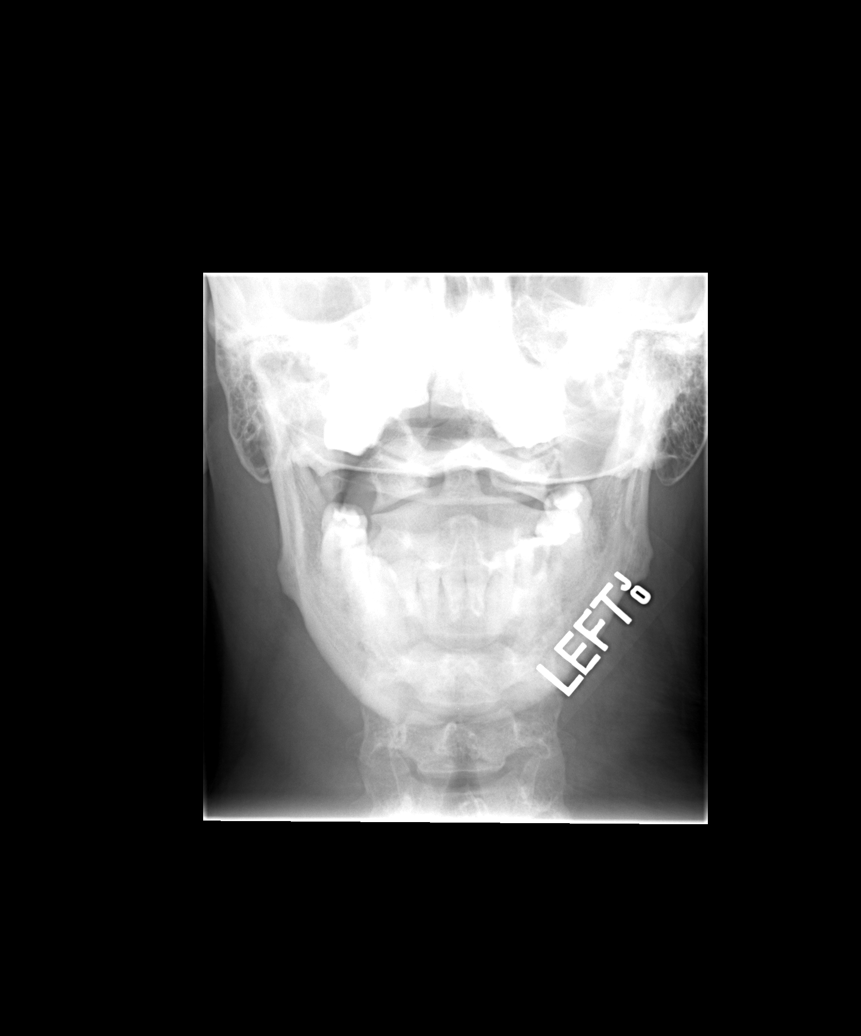

[5 of 5 positions shown; findings below may reference images not displayed]

FINDINGS: The cervical vertebrae are straightened alignment.
Intervertebral disc spaces appear normal.  No prevertebral soft
tissue swelling is seen.  On oblique views the foramina are patent.
The odontoid process is intact.
IMPRESSION: Straightened alignment.  Normal disc spaces.  No foraminal
narrowing.

## 2015-10-21 ENCOUNTER — Encounter (HOSPITAL_COMMUNITY): Payer: Self-pay | Admitting: *Deleted

## 2015-10-21 ENCOUNTER — Inpatient Hospital Stay (HOSPITAL_COMMUNITY)
Admission: AD | Admit: 2015-10-21 | Discharge: 2015-10-21 | Disposition: A | Discharge status: Home / Self Care | Payer: Medicaid Other | Source: Ambulatory Visit | Attending: Obstetrics & Gynecology | Admitting: Obstetrics & Gynecology

## 2015-10-21 DIAGNOSIS — N912 Amenorrhea, unspecified: Secondary | ICD-10-CM | POA: Diagnosis not present

## 2015-10-21 NOTE — MAU Provider Note (Signed)
  Nichole Green is a 35 y.o. No obstetric history on file. at Unknown who presents to [facility]U today for pregnancy verification. The patient denies abdominal pain or vaginal bleeding today. Hx of cHTN, stopped med recently, not sure name.  BP 144/92 (BP Location: Right Arm)   Pulse 97   Temp 98.4 F (36.9 C) (Oral)   Resp 18   Ht 5' 1.5" (1.562 m)   Wt 113.8 kg (250 lb 12.8 oz)   LMP 09/13/2015   BMI 46.62 kg/m   CONSTITUTIONAL: Well-developed, well-nourished female in no acute distress.  ENT: External right and left ear normal.  EYES: EOM intact, conjunctivae normal.  MUSCULOSKELETAL: Normal range of motion.  CARDIOVASCULAR: Regular heart rate RESPIRATORY: Normal effort NEUROLOGICAL: Alert and oriented to person, place, and time.  SKIN: Skin is warm and dry. No rash noted. Not diaphoretic. No erythema. No pallor. PSYCH: Normal mood and affect. Normal behavior. Normal judgment and thought content.  A: Amenorrhea  P: Discharge home Follow up with PCP (pt prefers) or [facility] for pregnancy confirmation-hrs of operation discussed Patient may return to [facility]U as needed or if her condition were to change or worsen   Julianne Handler, CNM  10/21/2015 3:44 PM

## 2015-10-21 NOTE — MAU Note (Signed)
Took a home preg test, was +, wanting confirmation.  Denies any problems. No pain or bleeding. On med for [facility]

## 2015-10-31 ENCOUNTER — Encounter (HOSPITAL_COMMUNITY): Payer: Self-pay | Admitting: *Deleted

## 2015-10-31 ENCOUNTER — Inpatient Hospital Stay (HOSPITAL_COMMUNITY)
Admission: AD | Admit: 2015-10-31 | Discharge: 2015-10-31 | Disposition: A | Discharge status: Home / Self Care | Payer: Medicaid Other | Source: Ambulatory Visit | Attending: Obstetrics & Gynecology | Admitting: Obstetrics & Gynecology

## 2015-10-31 DIAGNOSIS — O2340 Unspecified infection of urinary tract in pregnancy, unspecified trimester: Secondary | ICD-10-CM | POA: Diagnosis not present

## 2015-10-31 DIAGNOSIS — O09529 Supervision of elderly multigravida, unspecified trimester: Secondary | ICD-10-CM

## 2015-10-31 DIAGNOSIS — O10919 Unspecified pre-existing hypertension complicating pregnancy, unspecified trimester: Secondary | ICD-10-CM

## 2015-10-31 DIAGNOSIS — R51 Headache: Secondary | ICD-10-CM | POA: Insufficient documentation

## 2015-10-31 DIAGNOSIS — Z8249 Family history of ischemic heart disease and other diseases of the circulatory system: Secondary | ICD-10-CM | POA: Insufficient documentation

## 2015-10-31 DIAGNOSIS — Z3A01 Less than 8 weeks gestation of pregnancy: Secondary | ICD-10-CM | POA: Insufficient documentation

## 2015-10-31 DIAGNOSIS — O09521 Supervision of elderly multigravida, first trimester: Secondary | ICD-10-CM | POA: Insufficient documentation

## 2015-10-31 DIAGNOSIS — O2341 Unspecified infection of urinary tract in pregnancy, first trimester: Secondary | ICD-10-CM | POA: Diagnosis not present

## 2015-10-31 DIAGNOSIS — O10911 Unspecified pre-existing hypertension complicating pregnancy, first trimester: Secondary | ICD-10-CM | POA: Insufficient documentation

## 2015-10-31 DIAGNOSIS — Z3201 Encounter for pregnancy test, result positive: Secondary | ICD-10-CM | POA: Insufficient documentation

## 2015-10-31 DIAGNOSIS — O10912 Unspecified pre-existing hypertension complicating pregnancy, second trimester: Secondary | ICD-10-CM | POA: Diagnosis not present

## 2015-10-31 LAB — URINALYSIS, ROUTINE W REFLEX MICROSCOPIC
Bilirubin Urine: NEGATIVE
Glucose, UA: NEGATIVE mg/dL
Ketones, ur: NEGATIVE mg/dL
Nitrite: NEGATIVE
Protein, ur: 30 mg/dL — AB
SPECIFIC GRAVITY, URINE: 1.025 (ref 1.005–1.030)
pH: 6 (ref 5.0–8.0)

## 2015-10-31 LAB — URINE MICROSCOPIC-ADD ON

## 2015-10-31 LAB — WET PREP, GENITAL
Clue Cells Wet Prep HPF POC: NONE SEEN
Sperm: NONE SEEN
Trich, Wet Prep: NONE SEEN
Yeast Wet Prep HPF POC: NONE SEEN

## 2015-10-31 LAB — POCT PREGNANCY, URINE: PREG TEST UR: POSITIVE — AB

## 2015-10-31 LAB — ABO/RH: ABO/RH(D): B POS

## 2015-10-31 MED ORDER — ACETAMINOPHEN 500 MG PO TABS
1000.0000 mg | ORAL_TABLET | Freq: Once | ORAL | Status: AC
  Administered 2015-10-31: 1000 mg via ORAL
  Filled 2015-10-31: qty 2

## 2015-10-31 MED ORDER — CEPHALEXIN 500 MG PO CAPS: 500.0000 mg | ORAL_CAPSULE | Freq: Four times a day (QID) | ORAL | 0 refills | Status: DC

## 2015-10-31 MED ORDER — LABETALOL HCL 100 MG PO TABS: 100.0000 mg | ORAL_TABLET | Freq: Two times a day (BID) | ORAL | 2 refills | Status: DC

## 2015-10-31 NOTE — MAU Note (Signed)
Patient now states she has been having right sided abdominal pain as well as a headache.

## 2015-10-31 NOTE — Discharge Instructions (Signed)
Hypertension During Pregnancy Hypertension is also called high blood pressure. Blood pressure moves blood in your body. Sometimes, the force that moves the blood becomes too strong. When you are pregnant, this condition should be watched carefully. It can cause problems for you and your baby. HOME CARE   Make and keep all of your doctor visits.  Take medicine as told by your doctor. Tell your doctor about all medicines you take.  Eat very little salt.  Exercise regularly.  Do not drink alcohol.  Do not smoke.  Do not have drinks with caffeine.  Lie on your left side when resting.  Your health care provider may ask you to take one low-dose aspirin (81mg ) each day. GET HELP RIGHT AWAY IF:  You have bad belly (abdominal) pain.  You have sudden puffiness (swelling) in the hands, ankles, or face.  You gain 4 pounds (1.8 kilograms) or more in 1 week.  You throw up (vomit) repeatedly.  You have bleeding from the vagina.  You do not feel the baby moving as much.  You have a headache.  You have blurred or double vision.  You have muscle twitching or spasms.  You have shortness of breath.  You have blue fingernails and lips.  You have blood in your pee (urine). MAKE SURE YOU:  Understand these instructions.  Will watch your condition.  Will get help right away if you are not doing well or get worse.   This information is not intended to replace advice given to you by your health care provider. Make sure you discuss any questions you have with your health care provider.   Document Released: 04/14/2010 Document Revised: 04/02/2014 Document Reviewed: 10/09/2012 Elsevier Interactive Patient Education 2016 [place].  Pregnancy and Urinary Tract Infection A urinary tract infection (UTI) is a bacterial infection of the urinary tract. Infection of the urinary tract can include the ureters, kidneys (pyelonephritis), bladder (cystitis), and urethra (urethritis). All  pregnant women should be screened for bacteria in the urinary tract. Identifying and treating a UTI will decrease the risk of preterm labor and developing more serious infections in both the mother and baby. CAUSES Bacteria germs cause almost all UTIs.  RISK FACTORS Many factors can increase your chances of getting a UTI during pregnancy. These include:  Having a short urethra.  Poor toilet and hygiene habits.  Sexual intercourse.  Blockage of urine along the urinary tract.  Problems with the pelvic muscles or nerves.  Diabetes.  Obesity.  Bladder problems after having several children.  Previous history of UTI. SIGNS AND SYMPTOMS   Pain, burning, or a stinging feeling when urinating.  Suddenly feeling the need to urinate right away (urgency).  Loss of bladder control (urinary incontinence).  Frequent urination, more than is common with pregnancy.  Lower abdominal or back discomfort.  Cloudy urine.  Blood in the urine (hematuria).  Fever. When the kidneys are infected, the symptoms may be:  Back pain.  Flank pain on the right side more so than the left.  Fever.  Chills.  Nausea.  Vomiting. DIAGNOSIS  A urinary tract infection is usually diagnosed through urine tests. Additional tests and procedures are sometimes done. These may include:  Ultrasound exam of the kidneys, ureters, bladder, and urethra.  Looking in the bladder with a lighted tube (cystoscopy). TREATMENT Typically, UTIs can be treated with antibiotic medicines.  HOME CARE INSTRUCTIONS   Only take over-the-counter or prescription medicines as directed by your health care provider. If you were prescribed  antibiotics, take them as directed. Finish them even if you start to feel better.  Drink enough fluids to keep your urine clear or pale yellow.  Do not have sexual intercourse until the infection is gone and your health care provider says it is okay.  Make sure you are tested for UTIs  throughout your pregnancy. These infections often come back. Preventing a UTI in the Future  Practice good toilet habits. Always wipe from front to back. Use the tissue only once.  Do not hold your urine. Empty your bladder as soon as possible when the urge comes.  Do not douche or use deodorant sprays.  Wash with soap and warm water around the genital area and the anus.  Empty your bladder before and after sexual intercourse.  Wear underwear with a cotton crotch.  Avoid caffeine and carbonated drinks. They can irritate the bladder.  Drink cranberry juice or take cranberry pills. This may decrease the risk of getting a UTI.  Do not drink alcohol.  Keep all your appointments and tests as scheduled. SEEK MEDICAL CARE IF:   Your symptoms get worse.  You are still having fevers 2 or more days after treatment begins.  You have a rash.  You feel that you are having problems with medicines prescribed.  You have abnormal vaginal discharge. SEEK IMMEDIATE MEDICAL CARE IF:   You have back or flank pain.  You have chills.  You have blood in your urine.  You have nausea and vomiting.  You have contractions of your uterus.  You have a gush of fluid from the vagina. MAKE SURE YOU:  Understand these instructions.   Will watch your condition.   Will get help right away if you are not doing well or get worse.    This information is not intended to replace advice given to you by your health care provider. Make sure you discuss any questions you have with your health care provider.   Document Released: 07/07/2010 Document Revised: 12/31/2012 Document Reviewed: 10/09/2012 Elsevier Interactive Patient Education [employer].

## 2015-10-31 NOTE — MAU Provider Note (Signed)
History     CSN: [id number]  Arrival date and time: 10/31/15 1003   First Provider Initiated Contact with Patient 10/31/15 1050      Chief Complaint  Patient presents with  . Hypertension  . Vaginal Bleeding   HPI   [name] is a 35 y.o. female with a history of Chronic HTN, G3P2000 at [redacted]w[redacted]d here with concerns about her BP; she is currently taking metoprolol 25 mg daily, she last took it 3 weeks ago. This was prescribed by her PCP due to chronic hypertension. She checked her BP at home and it was high, she was not sure if she could take the BP medication that was prescribed to her prior to pregnancy.    She had a pregnancy confirmation appointment at the [facility] recently.  She is requesting an [place] today.   She saw one drop of blood yesterday when she wiped, none currently. None today. Denies abdominal pain.   OB History    Gravida Para Term Preterm AB Living   3 2 2          SAB TAB Ectopic Multiple Live Births                  Past Medical History:  Diagnosis Date  . Hypertension     Past Surgical History:  Procedure Laterality Date  . BREAST REDUCTION SURGERY      Family History  Problem Relation Age of Onset  . Hypertension Mother   . Diabetes Mother     Social History  Substance Use Topics  . Smoking status: Never Smoker  . Smokeless tobacco: Never Used  . Alcohol use No    Allergies: No Known Allergies  Prescriptions Prior to Admission  Medication Sig Dispense Refill Last Dose  . Prenatal Vit-Fe Fumarate-FA (PRENATAL MULTIVITAMIN) TABS tablet Take 1 tablet by mouth daily at 12 noon.   10/31/2015 at Unknown time   Results for orders placed or performed during the hospital encounter of 10/31/15 (from the past 48 hour(s))  Urinalysis, Routine w reflex microscopic (not at [facility])     Status: Abnormal   Collection Time: 10/31/15 10:10 AM  Result Value Ref Range   Color, Urine YELLOW YELLOW   APPearance CLOUDY (A) CLEAR   Specific Gravity, Urine 1.025 1.005  - 1.030   pH 6.0 5.0 - 8.0   Glucose, UA NEGATIVE NEGATIVE mg/dL   Hgb urine dipstick MODERATE (A) NEGATIVE   Bilirubin Urine NEGATIVE NEGATIVE   Ketones, ur NEGATIVE NEGATIVE mg/dL   Protein, ur 30 (A) NEGATIVE mg/dL   Nitrite NEGATIVE NEGATIVE   Leukocytes, UA MODERATE (A) NEGATIVE  Urine microscopic-add on     Status: Abnormal   Collection Time: 10/31/15 10:10 AM  Result Value Ref Range   Squamous Epithelial / LPF 0-5 (A) NONE SEEN   WBC, UA 6-30 0 - 5 WBC/hpf   RBC / HPF 0-5 0 - 5 RBC/hpf   Bacteria, UA FEW (A) NONE SEEN  Pregnancy, urine POC     Status: Abnormal   Collection Time: 10/31/15 10:20 AM  Result Value Ref Range   Preg Test, Ur POSITIVE (A) NEGATIVE    Comment:        THE SENSITIVITY OF THIS METHODOLOGY IS >24 mIU/mL   Wet prep, genital     Status: Abnormal   Collection Time: 10/31/15 10:57 AM  Result Value Ref Range   Yeast Wet Prep HPF POC NONE SEEN NONE SEEN   Trich, Wet Prep NONE SEEN  NONE SEEN   Clue Cells Wet Prep HPF POC NONE SEEN NONE SEEN   WBC, Wet Prep HPF POC MANY (A) NONE SEEN    Comment: MODERATE BACTERIA SEEN   Sperm NONE SEEN   ABO/Rh     Status: None (Preliminary result)   Collection Time: 10/31/15 11:10 AM  Result Value Ref Range   ABO/RH(D) B POS     Review of Systems  Gastrointestinal: Negative for abdominal pain, constipation, diarrhea, nausea and vomiting.  Genitourinary: Negative for dysuria and urgency.  Neurological: Positive for headaches (Occasional HA, has not tried tylenol ).   Physical Exam   Blood pressure 151/68, pulse 94, temperature 98.7 F (37.1 C), temperature source Oral, resp. rate 18, height 5' 3.39" (1.61 m), weight 253 lb 1.9 oz (114.8 kg), last menstrual period 09/13/2015, SpO2 99 %.  Physical Exam  Constitutional: She is oriented to person, place, and time. She appears well-developed and well-nourished. No distress.  HENT:  Head: Normocephalic.  Eyes: Pupils are equal, round, and reactive to light.   Respiratory: Effort normal.  Genitourinary:  Genitourinary Comments: Speculum exam: Vagina - Small amount of creamy, yellow discharge, no odor Cervix - No contact bleeding, no active bleeding  Bimanual exam: Cervix closed Uterus non tender, slightly enlarged  Adnexa non tender, no masses bilaterally GC/Chlam, wet prep done Chaperone present for exam.  Musculoskeletal: Normal range of motion.  Neurological: She is alert and oriented to person, place, and time. No cranial nerve deficit or sensory deficit. GCS eye subscore is 4. GCS verbal subscore is 5. GCS motor subscore is 6.  Skin: Skin is warm. She is not diaphoretic.  Psychiatric: Her behavior is normal.    MAU Course  Procedures  MDM  UA Urine culture in process  Wet Prep GC ABO/RH   B positive blood type  Tylenol 1 gram; HA pain down to 1/10  Assessment and Plan   A:  1. Chronic hypertension during pregnancy, antepartum   2. UTI in pregnancy, antepartum, first trimester   3. AMA (advanced maternal age) multigravida 26+, first trimester     P:  Discharge home in stable condition Rx: Labetalol 100 mg BID Message sent to the [facility] for BP check and first appointment; first trimester [place] ordered.  Plan for ASA around 14 weeks; discussed this with patient First trimester warning signs discussed Ok to take tylenol PRN as needed for HA, as directed on the bottle  Return to MAU with any vaginal bleeding or abdominal pain.    Lezlie Lye, NP 10/31/2015 1:00 PM

## 2015-10-31 NOTE — MAU Note (Signed)
Patient states she had some vaginal bleeding yesterday that has stopped today.  Denies any cramping or abdominal pain.  States her BP has been elevated, but she is not sure if she should taker her medicine while she is pregnant.  Also requesting an ultrasound.

## 2015-11-01 LAB — CULTURE, OB URINE

## 2015-11-01 LAB — GC/CHLAMYDIA PROBE AMP (~~LOC~~) NOT AT ARMC
CHLAMYDIA, DNA PROBE: NEGATIVE
Neisseria Gonorrhea: NEGATIVE

## 2015-11-03 ENCOUNTER — Telehealth: Payer: Self-pay | Admitting: *Deleted

## 2015-11-03 ENCOUNTER — Ambulatory Visit (INDEPENDENT_AMBULATORY_CARE_PROVIDER_SITE_OTHER): Payer: Medicaid Other | Admitting: Obstetrics & Gynecology

## 2015-11-03 ENCOUNTER — Encounter: Payer: Self-pay | Admitting: Obstetrics & Gynecology

## 2015-11-03 ENCOUNTER — Other Ambulatory Visit (HOSPITAL_COMMUNITY): Payer: Self-pay | Admitting: Obstetrics and Gynecology

## 2015-11-03 ENCOUNTER — Ambulatory Visit (HOSPITAL_COMMUNITY)
Admission: RE | Admit: 2015-11-03 | Discharge: 2015-11-03 | Disposition: A | Discharge status: Home / Self Care | Payer: Medicaid Other | Source: Ambulatory Visit | Attending: Obstetrics and Gynecology | Admitting: Obstetrics and Gynecology

## 2015-11-03 VITALS — BP 150/79

## 2015-11-03 DIAGNOSIS — N949 Unspecified condition associated with female genital organs and menstrual cycle: Secondary | ICD-10-CM | POA: Diagnosis present

## 2015-11-03 DIAGNOSIS — O209 Hemorrhage in early pregnancy, unspecified: Secondary | ICD-10-CM

## 2015-11-03 DIAGNOSIS — O09521 Supervision of elderly multigravida, first trimester: Secondary | ICD-10-CM | POA: Insufficient documentation

## 2015-11-03 DIAGNOSIS — N83201 Unspecified ovarian cyst, right side: Secondary | ICD-10-CM | POA: Diagnosis not present

## 2015-11-03 DIAGNOSIS — O10919 Unspecified pre-existing hypertension complicating pregnancy, unspecified trimester: Secondary | ICD-10-CM

## 2015-11-03 DIAGNOSIS — O10912 Unspecified pre-existing hypertension complicating pregnancy, second trimester: Secondary | ICD-10-CM | POA: Diagnosis not present

## 2015-11-03 DIAGNOSIS — N9489 Other specified conditions associated with female genital organs and menstrual cycle: Secondary | ICD-10-CM | POA: Insufficient documentation

## 2015-11-03 DIAGNOSIS — O3680X0 Pregnancy with inconclusive fetal viability, not applicable or unspecified: Secondary | ICD-10-CM

## 2015-11-03 DIAGNOSIS — Z3A01 Less than 8 weeks gestation of pregnancy: Secondary | ICD-10-CM | POA: Insufficient documentation

## 2015-11-03 IMAGING — US US OB COMP LESS 14 WK
1 series · 15 of 28 positions shown · non-contrast
Comparison: None.

CLINICAL DATA: Vaginal bleeding in first trimester pregnancy.
Advanced maternal age. Chronic hypertension. Gestational age by LMP
of 7 weeks 2 days.

EXAM:
OBSTETRIC <14 WK US AND TRANSVAGINAL OB US
TECHNIQUE: Both transabdominal and transvaginal ultrasound examinations were
performed for complete evaluation of the gestation as well as the
maternal uterus, adnexal regions, and pelvic cul-de-sac.
Transvaginal technique was performed to assess early pregnancy.

[Series 1: us ob comp less 14 wk · 15 of 160 slices shown]
[im 1/160]
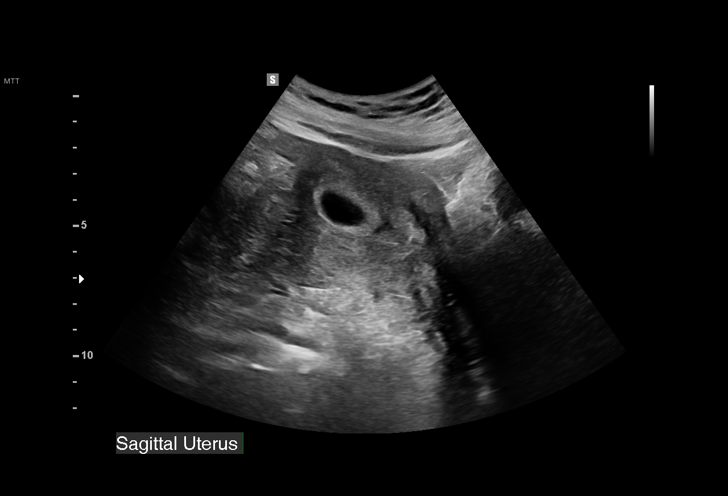
[im 12/160]
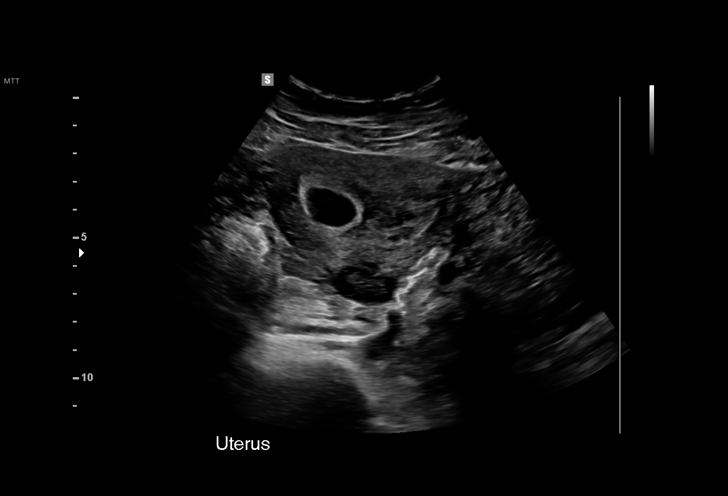
[im 24/160]
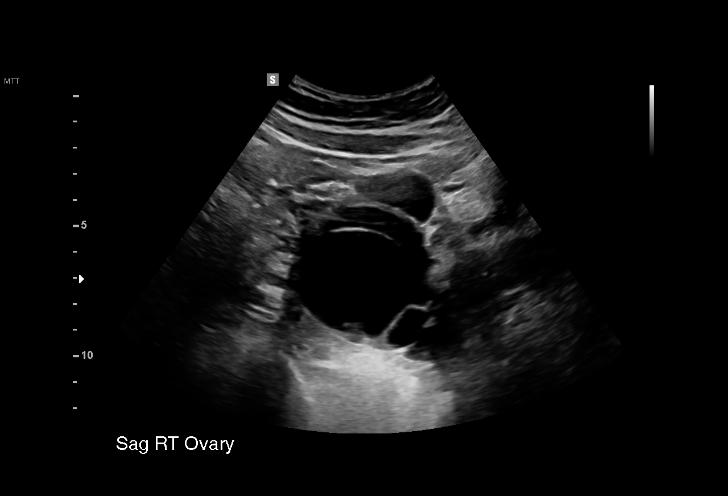
[im 36/160]
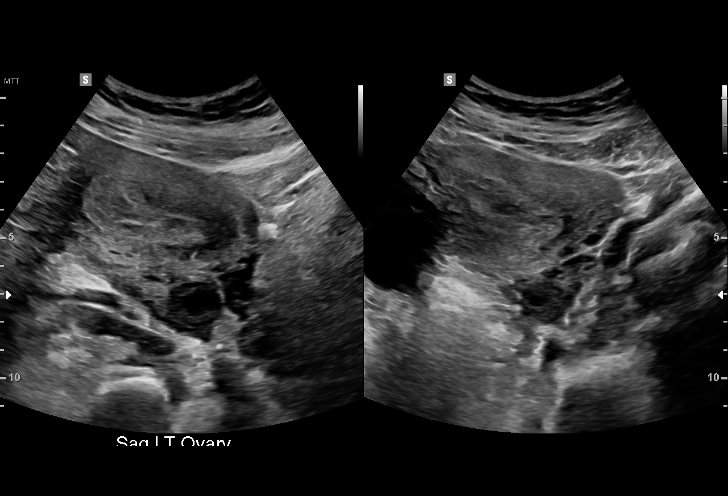
[im 48/160]
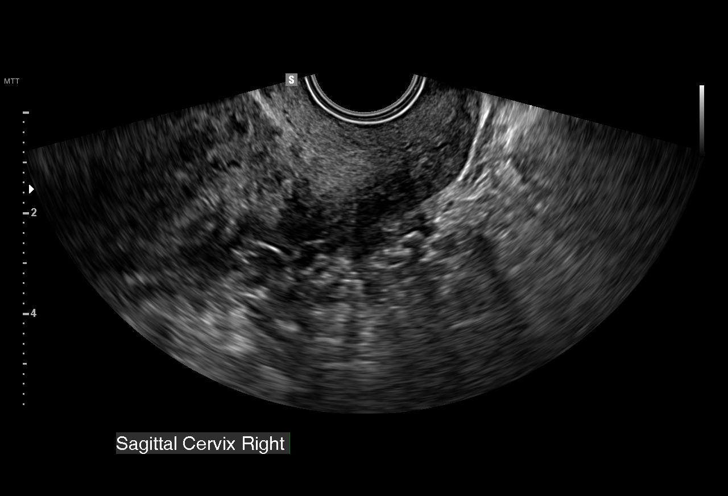
[im 59/160]
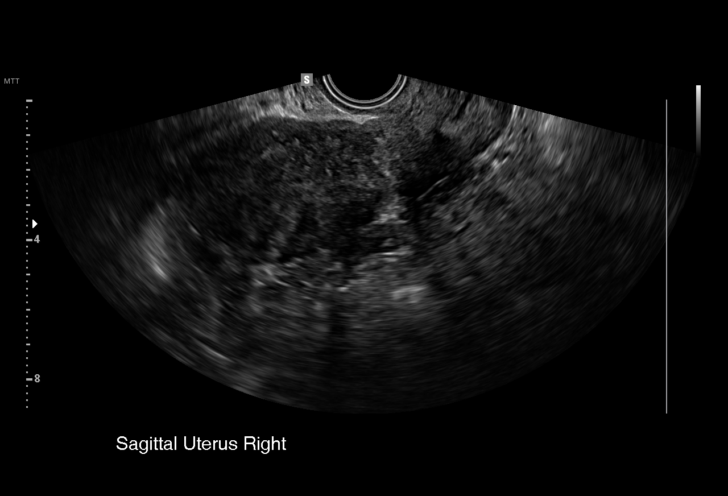
[im 71/160]
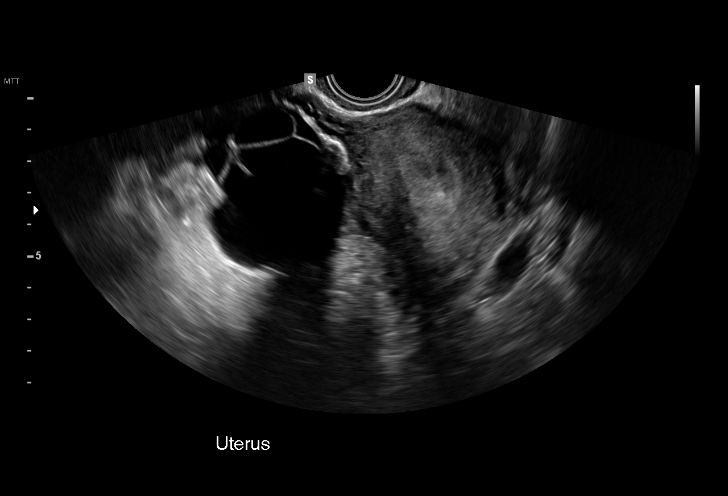
[im 83/160]
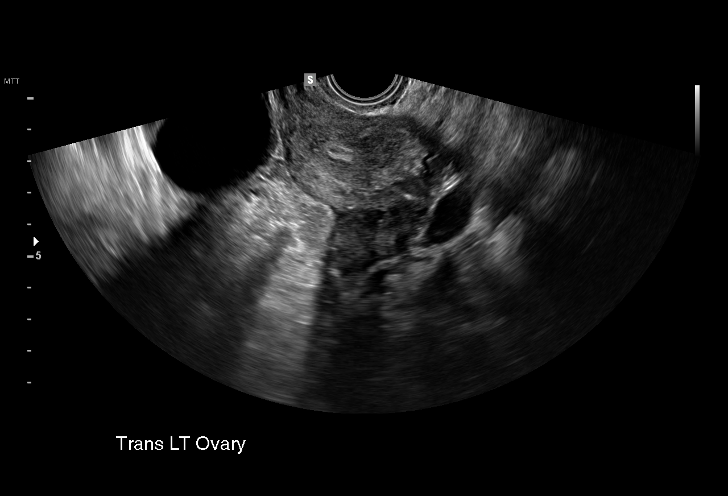
[im 89/160]
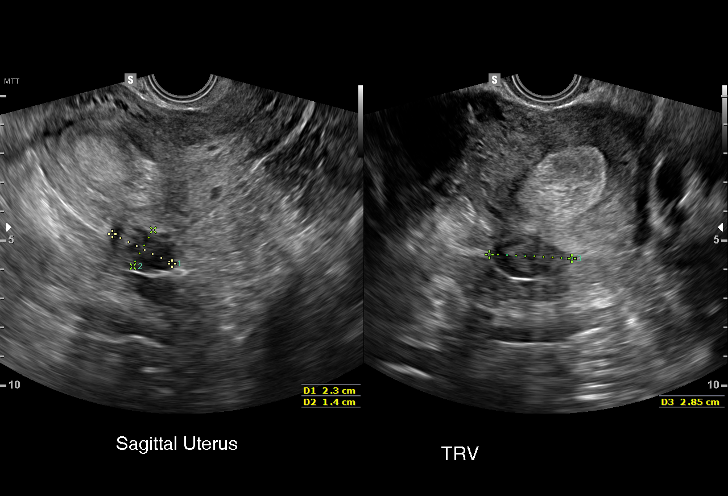
[im 101/160]
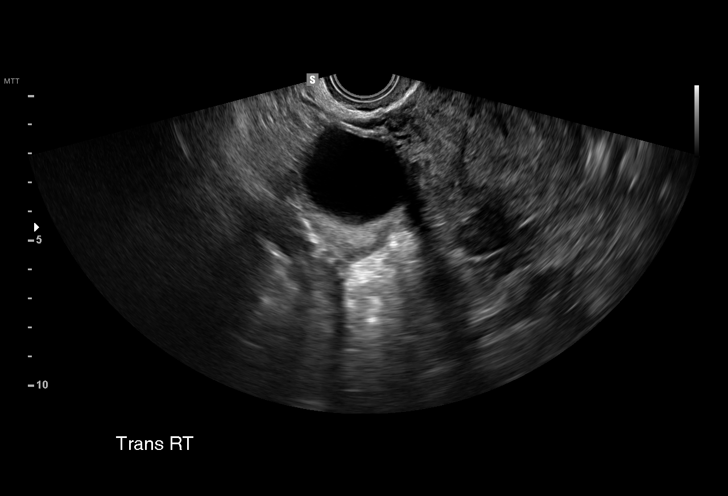
[im 112/160]
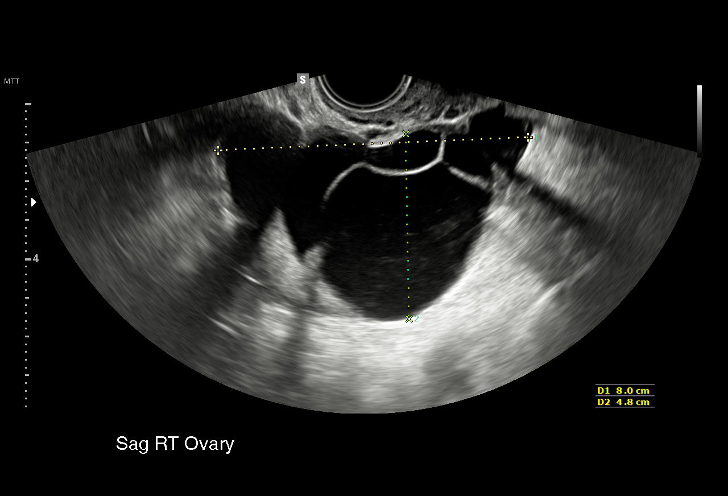
[im 124/160]
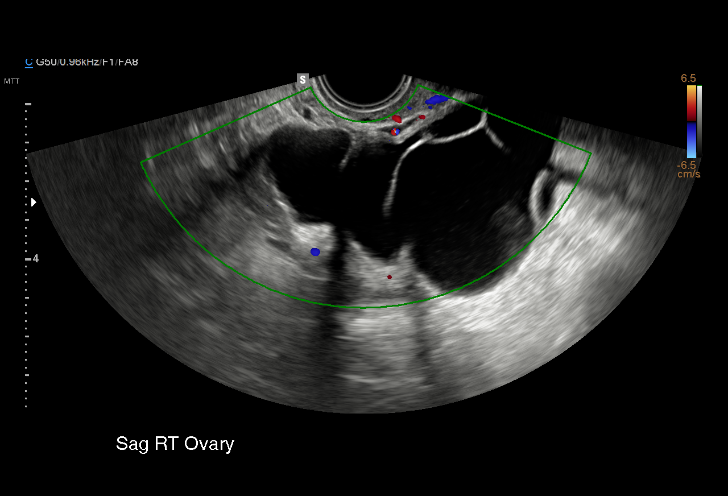
[im 136/160]
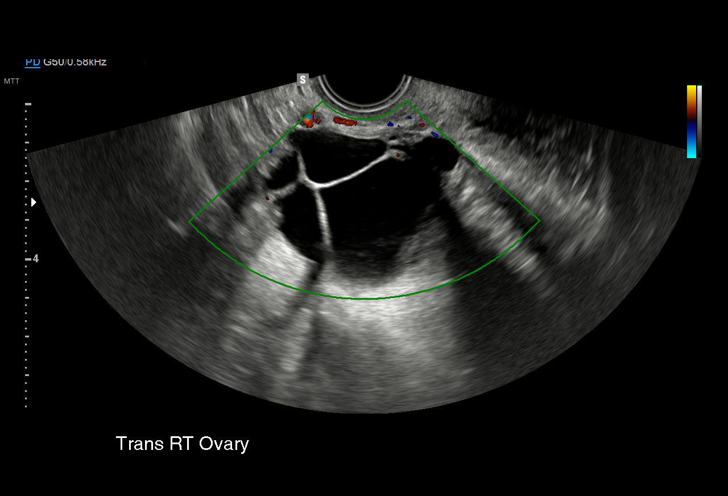
[im 148/160]
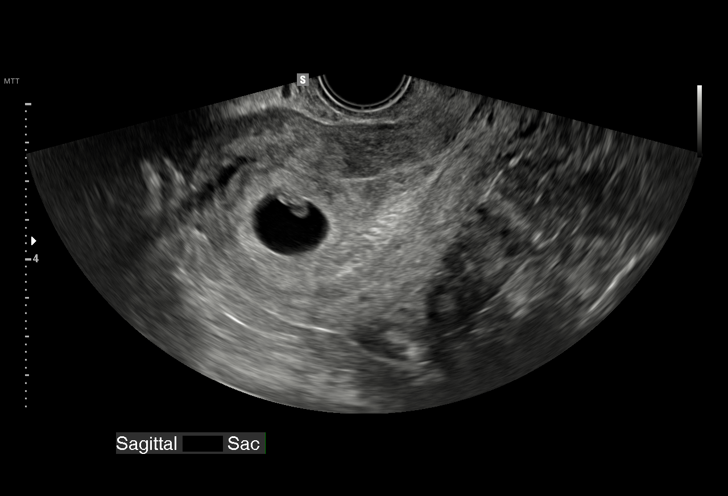
[im 160/160]
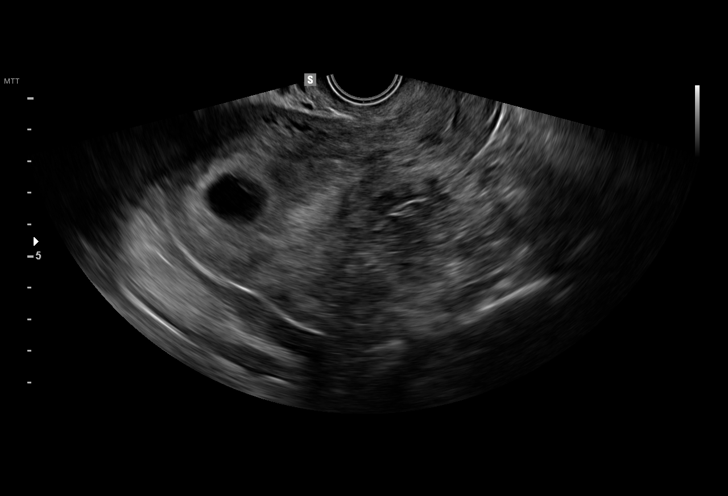

[15 of 28 positions shown; findings below may reference images not displayed]

FINDINGS: Intrauterine gestational sac: Single

Yolk sac:  Visualized

Embryo:  Visualized

Cardiac Activity: Visualized

Heart Rate: 119  bpm

CRL:  8  mm   6 w   5 d                  US EDC: [DATE]

Subchorionic hemorrhage:  Tiny subchorionic hemorrhage noted.

Maternal uterus/adnexae: A subserosal fibroid is seen in the
posterior uterine corpus measuring 2.9 cm in maximum diameter.

Normal appearance of left ovary.

A complex cystic lesion is seen in the right adnexa which measures
8.0 x 4.8 x 5.6 cm. This shows multiple thin internal septations as
well as several small mural nodular densities. This is highly
suspicious for cystic ovarian neoplasm.
IMPRESSION: Single living IUP measuring 6 weeks 5 days with US EDC of
[DATE]. This is concordant with LMP.

8 cm complex cystic lesion in right adnexa, highly suspicious for
cystic ovarian neoplasm. Consider further characterization with
pelvic MRI without contrast versus surgical evaluation.

## 2015-11-03 NOTE — Telephone Encounter (Signed)
Patient needs referral to gyn oncology for a right adnexal mass. She is also [redacted]w[redacted]d pregnant. Called GYN ONC and was unable to speak with anyone. Message left with patient information and asked them to call us back. Patient requested that we call her with the appointment at [phone] or it is ok to speak with her husband on [phone].

## 2015-11-03 NOTE — Progress Notes (Signed)
   Subjective:    Patient ID: Nichole Green, female    DOB: May 19, 1980, 35 y.o.   MRN: [id number]  HPI  35 year old female presents for results of [place].  Fetus is viable at 6 weeks 3 days.  Large cystic right ovary.  See below  TECHNIQUE: Both transabdominal and transvaginal ultrasound examinations were performed for complete evaluation of the gestation as well as the maternal uterus, adnexal regions, and pelvic cul-de-sac. Transvaginal technique was performed to assess early pregnancy.  COMPARISON:  None.  FINDINGS: Intrauterine gestational sac: Single  Yolk sac:  Visualized  Embryo:  Visualized  Cardiac Activity: Visualized  Heart Rate: 119  bpm  CRL:  8  mm   6 w   5 d                  [place] EDC: 06/23/2016  Subchorionic hemorrhage:  Tiny subchorionic hemorrhage noted.  Maternal uterus/adnexae: A subserosal fibroid is seen in the posterior uterine corpus measuring 2.9 cm in maximum diameter.  Normal appearance of left ovary.  A complex cystic lesion is seen in the right adnexa which measures 8.0 x 4.8 x 5.6 cm. This shows multiple thin internal septations as well as several small mural nodular densities. This is highly suspicious for cystic ovarian neoplasm.  IMPRESSION: Single living IUP measuring 6 weeks 5 days with [place] EDC of 06/23/2016. This is concordant with LMP.  8 cm complex cystic lesion in right adnexa, highly suspicious for cystic ovarian neoplasm. Consider further characterization with pelvic MRI without contrast versus surgical evaluation.   Electronically Signed   By: Earle Gell M.D.   On: 11/03/2015 10:48  Review of Systems  Constitutional: Negative.   Respiratory: Negative.   Cardiovascular: Negative.   Gastrointestinal: Negative.   Genitourinary:       Scant bleeding x1 but none recently.  Neurological: Positive for headaches.  Psychiatric/Behavioral: Negative.        Objective:   Physical Exam  Constitutional: She is  oriented to person, place, and time. She appears well-developed and well-nourished.  HENT:  Head: Normocephalic and atraumatic.  Cardiovascular: Normal rate.   Pulmonary/Chest: Effort normal.  Abdominal: Soft. There is no tenderness.  Neurological: She is alert and oriented to person, place, and time.  Skin: Skin is warm and dry.  Psychiatric: She has a normal mood and affect.   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 Need first prenatal visit at 78 weeks.

## 2015-11-04 NOTE — Telephone Encounter (Signed)
Patient has been scheduled for appt on 11/07/2015 with  Gyn oncology.

## 2015-11-07 ENCOUNTER — Encounter: Payer: Self-pay | Admitting: Gynecology

## 2015-11-07 ENCOUNTER — Ambulatory Visit: Payer: Medicaid Other | Attending: Gynecology | Admitting: Gynecology

## 2015-11-07 VITALS — BP 128/90 | HR 84 | Temp 97.7°F | Resp 16 | Ht 63.39 in | Wt 251.1 lb

## 2015-11-07 DIAGNOSIS — O161 Unspecified maternal hypertension, first trimester: Secondary | ICD-10-CM | POA: Diagnosis present

## 2015-11-07 DIAGNOSIS — Z9889 Other specified postprocedural states: Secondary | ICD-10-CM | POA: Insufficient documentation

## 2015-11-07 DIAGNOSIS — Z79899 Other long term (current) drug therapy: Secondary | ICD-10-CM | POA: Insufficient documentation

## 2015-11-07 DIAGNOSIS — O3481 Maternal care for other abnormalities of pelvic organs, first trimester: Secondary | ICD-10-CM | POA: Diagnosis present

## 2015-11-07 DIAGNOSIS — Z3A01 Less than 8 weeks gestation of pregnancy: Secondary | ICD-10-CM | POA: Diagnosis present

## 2015-11-07 DIAGNOSIS — N83201 Unspecified ovarian cyst, right side: Secondary | ICD-10-CM | POA: Diagnosis not present

## 2015-11-07 NOTE — Progress Notes (Signed)
Consult Note: Gyn-Onc   Nichole Green 35 y.o. female  Chief Complaint  Patient presents with  . adnexal mass    Assessment Complex cystic lesion of the right ovary at 7 weeks of pregnancy. 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  Plan: I would recommend the patient had a repeat ultrasound at [redacted] weeks gestation. If the cyst persisted then the patient should return to see Dr. Denman George with plans to proceed with minimally invasive surgery to remove the ovarian cyst. All the patient and her husband's questions are answered. They are aware that surgery and anesthesia do increase the risk of miscarriage. (The patient is seen in conjunction with a video interpreter today)   HPI: 35 year old seen in consultation request of Dr. Silas Sacramento regarding management of a complex cystic lesion of the right adnexa discovered at 6 weeks of pregnancy. The cyst has multiple thin septations as well as several small mural nodular densities. The patient is essentially asymptomatic. She has no significant gynecologic history.  Review of Systems:10 point review of systems is negative except as noted in interval history.   Vitals: Blood pressure 128/90, pulse 84, temperature 97.7 F (36.5 C), temperature source Oral, resp. rate 16, height 5' 3.39" (1.61 m), weight 251 lb 1.6 oz (113.9 kg), last menstrual period 09/13/2015, SpO2 98 %.  Physical Exam: General : The patient is a healthy woman in no acute distress.  HEENT: normocephalic, extraoccular movements normal; neck is supple without thyromegally   The abdomen is obese and nontender no masses are palpable. Lower extremities: No edema or varicosities. Normal range of motion      No Known Allergies  Past Medical History:  Diagnosis Date  . Hypertension     Past Surgical History:  Procedure Laterality Date  . BREAST REDUCTION SURGERY      Current Outpatient Prescriptions  Medication Sig Dispense Refill  . labetalol  (NORMODYNE) 100 MG tablet Take 1 tablet (100 mg total) by mouth 2 (two) times daily. 60 tablet 2  . Prenatal Vit-Fe Fumarate-FA (PRENATAL MULTIVITAMIN) TABS tablet Take 1 tablet by mouth daily at 12 noon.    . cephALEXin (KEFLEX) 500 MG capsule Take 1 capsule (500 mg total) by mouth 4 (four) times daily. (Patient not taking: Reported on 11/07/2015) 20 capsule 0   No current facility-administered medications for this visit.     Social History   Social History  . Marital status: Married    Spouse name: N/A  . Number of children: N/A  . Years of education: N/A   Occupational History  . Not on file.   Social History Main Topics  . Smoking status: Never Smoker  . Smokeless tobacco: Never Used  . Alcohol use No  . Drug use: No  . Sexual activity: Yes    Birth control/ protection: None   Other Topics Concern  . Not on file   Social History Narrative  . No narrative on file    Family History  Problem Relation Age of Onset  . Hypertension Mother   . Diabetes Mother       Marti Sleigh, MD 11/07/2015, 1:53 PM

## 2015-11-07 NOTE — Patient Instructions (Signed)
Please schedule an ultrasound with your ob at 14 weeks. We will review the report and make further recommendations at that time. Please call our office with any questions or concerns.

## 2015-11-21 ENCOUNTER — Inpatient Hospital Stay (HOSPITAL_COMMUNITY)
Admission: AD | Admit: 2015-11-21 | Discharge: 2015-11-21 | Disposition: A | Discharge status: Home / Self Care | Payer: Medicaid Other | Source: Ambulatory Visit | Attending: Family Medicine | Admitting: Family Medicine

## 2015-11-21 ENCOUNTER — Encounter (HOSPITAL_COMMUNITY): Payer: Self-pay | Admitting: *Deleted

## 2015-11-21 DIAGNOSIS — J309 Allergic rhinitis, unspecified: Secondary | ICD-10-CM | POA: Insufficient documentation

## 2015-11-21 DIAGNOSIS — J029 Acute pharyngitis, unspecified: Secondary | ICD-10-CM | POA: Diagnosis present

## 2015-11-21 DIAGNOSIS — Z3A09 9 weeks gestation of pregnancy: Secondary | ICD-10-CM | POA: Insufficient documentation

## 2015-11-21 DIAGNOSIS — O26891 Other specified pregnancy related conditions, first trimester: Secondary | ICD-10-CM | POA: Insufficient documentation

## 2015-11-21 DIAGNOSIS — R05 Cough: Secondary | ICD-10-CM | POA: Diagnosis present

## 2015-11-21 DIAGNOSIS — R059 Cough, unspecified: Secondary | ICD-10-CM

## 2015-11-21 DIAGNOSIS — J3089 Other allergic rhinitis: Secondary | ICD-10-CM

## 2015-11-21 HISTORY — DX: Unspecified infectious disease: B99.9

## 2015-11-21 HISTORY — DX: Gestational (pregnancy-induced) hypertension without significant proteinuria, unspecified trimester: O13.9

## 2015-11-21 LAB — URINALYSIS, ROUTINE W REFLEX MICROSCOPIC
Bilirubin Urine: NEGATIVE
Glucose, UA: NEGATIVE mg/dL
KETONES UR: NEGATIVE mg/dL
Nitrite: NEGATIVE
PROTEIN: 100 mg/dL — AB
Specific Gravity, Urine: >1.03 — ABNORMAL HIGH (ref 1.005–1.030)
pH: 5.5 (ref 5.0–8.0)

## 2015-11-21 LAB — URINE MICROSCOPIC-ADD ON

## 2015-11-21 LAB — INFLUENZA PANEL BY PCR (TYPE A & B)
H1N1 flu by pcr: NOT DETECTED
INFLAPCR: NEGATIVE
Influenza B By PCR: NEGATIVE

## 2015-11-21 LAB — RAPID STREP SCREEN (MED CTR MEBANE ONLY): STREPTOCOCCUS, GROUP A SCREEN (DIRECT): NEGATIVE

## 2015-11-21 MED ORDER — BENZONATATE 100 MG PO CAPS: 100.0000 mg | ORAL_CAPSULE | Freq: Three times a day (TID) | ORAL | 0 refills | Status: DC

## 2015-11-21 MED ORDER — FLUTICASONE PROPIONATE 50 MCG/ACT NA SUSP: 2.0000 | Freq: Every day | NASAL | 0 refills | Status: DC

## 2015-11-21 MED ORDER — PROMETHAZINE HCL 12.5 MG PO TABS: 12.5000 mg | ORAL_TABLET | Freq: Four times a day (QID) | ORAL | 0 refills | Status: DC | PRN

## 2015-11-21 NOTE — MAU Provider Note (Signed)
History     CSN: [id number]  Arrival date and time: 11/21/15 [id number]   First Provider Initiated Contact with Patient 11/21/15 1022      Chief Complaint  Patient presents with  . Cough  . Sore Throat   Nichole Green is a 35 y.o. G3P2002 at [redacted]w[redacted]d who presents with cold symptoms. Reports runny nose, congestion, sore throat, & nonproductive cough. Rhinorrhea is clear. Episode of chills yesterday but didn't check temperature. Denies sick contacts.  Denies abdominal pain or vaginal bleeding. Sore throat is constant; worse with swallowing. Rates pain 9/10. Has not treated.    URI   This is a new problem. The current episode started yesterday. The problem has been unchanged. There has been no fever. Associated symptoms include congestion, coughing, headaches, nausea, a plugged ear sensation, rhinorrhea and a sore throat. Pertinent negatives include no abdominal pain, chest pain, diarrhea, dysuria, ear pain, joint pain, sinus pain, sneezing, swollen glands, vomiting or wheezing. She has tried nothing for the symptoms.   OB History    Gravida Para Term Preterm AB Living   3 2 2  0 0 2   SAB TAB Ectopic Multiple Live Births   0 0 0 0 2      Obstetric Comments   Elevated BP with preg      Past Medical History:  Diagnosis Date  . Hypertension   . Infection    UTI  . Pregnancy induced hypertension     Past Surgical History:  Procedure Laterality Date  . BREAST REDUCTION SURGERY      Family History  Problem Relation Age of Onset  . Hypertension Mother   . Diabetes Mother     Social History  Substance Use Topics  . Smoking status: Current Some Day Smoker    Years: 2.00    Types: Cigarettes  . Smokeless tobacco: Never Used     Comment: hookah  . Alcohol use No    Allergies: No Known Allergies  Prescriptions Prior to Admission  Medication Sig Dispense Refill Last Dose  . acetaminophen (TYLENOL) 325 MG tablet Take 650 mg by mouth every 6 (six) hours as needed for mild pain or  headache.   Past Week at Unknown time  . labetalol (NORMODYNE) 100 MG tablet Take 1 tablet (100 mg total) by mouth 2 (two) times daily. 60 tablet 2 11/20/2015 at 2200  . Prenatal Vit-Fe Fumarate-FA (PRENATAL MULTIVITAMIN) TABS tablet Take 1 tablet by mouth daily at 12 noon.   11/21/2015 at Unknown time  . cephALEXin (KEFLEX) 500 MG capsule Take 1 capsule (500 mg total) by mouth 4 (four) times daily. (Patient not taking: Reported on 11/07/2015) 20 capsule 0 Completed Course at Unknown time    Review of Systems  Constitutional: Positive for chills. Negative for diaphoresis, fever and malaise/fatigue.  HENT: Positive for congestion, rhinorrhea and sore throat. Negative for ear pain and sneezing.   Respiratory: Positive for cough. Negative for sputum production, shortness of breath and wheezing.   Cardiovascular: Negative for chest pain.  Gastrointestinal: Positive for nausea. Negative for abdominal pain, diarrhea and vomiting.  Genitourinary: Negative.  Negative for dysuria.  Musculoskeletal: Negative for joint pain.  Neurological: Positive for headaches.   Physical Exam   Blood pressure 137/80, pulse 92, temperature 98.4 F (36.9 C), temperature source Oral, resp. rate 18, last menstrual period 09/13/2015, SpO2 98 %.  Physical Exam  Nursing note and vitals reviewed. Constitutional: She is oriented to person, place, and time. She appears well-developed and well-nourished. No  distress.  HENT:  Head: Normocephalic and atraumatic.  Right Ear: Tympanic membrane normal.  Left Ear: Tympanic membrane normal.  Nose: Mucosal edema and rhinorrhea present. Right sinus exhibits no maxillary sinus tenderness and no frontal sinus tenderness. Left sinus exhibits no maxillary sinus tenderness and no frontal sinus tenderness.  Mouth/Throat: Posterior oropharyngeal erythema present. No oropharyngeal exudate, posterior oropharyngeal edema or tonsillar abscesses.  Eyes: Conjunctivae are normal. Right eye  exhibits no discharge. Left eye exhibits no discharge. No scleral icterus.  Neck: Normal range of motion. Neck supple.  Cardiovascular: Normal rate, regular rhythm and normal heart sounds.   No murmur heard. Respiratory: Effort normal and breath sounds normal. No respiratory distress. She has no wheezes.  GI: Soft.  Lymphadenopathy:       Head (right side): No submental, no submandibular and no tonsillar adenopathy present.       Head (left side): Submental and submandibular adenopathy present. No tonsillar adenopathy present.  Neurological: She is alert and oriented to person, place, and time.  Skin: Skin is warm and dry. She is not diaphoretic.  Psychiatric: She has a normal mood and affect. Her behavior is normal. Judgment and thought content normal.    MAU Course  Procedures Results for orders placed or performed during the hospital encounter of 11/21/15 (from the past 24 hour(s))  Urinalysis, Routine w reflex microscopic (not at [facility])     Status: Abnormal   Collection Time: 11/21/15  9:45 AM  Result Value Ref Range   Color, Urine YELLOW YELLOW   APPearance CLEAR CLEAR   Specific Gravity, Urine >1.030 (H) 1.005 - 1.030   pH 5.5 5.0 - 8.0   Glucose, UA NEGATIVE NEGATIVE mg/dL   Hgb urine dipstick LARGE (A) NEGATIVE   Bilirubin Urine NEGATIVE NEGATIVE   Ketones, ur NEGATIVE NEGATIVE mg/dL   Protein, ur 100 (A) NEGATIVE mg/dL   Nitrite NEGATIVE NEGATIVE   Leukocytes, UA TRACE (A) NEGATIVE  Urine microscopic-add on     Status: Abnormal   Collection Time: 11/21/15  9:45 AM  Result Value Ref Range   Squamous Epithelial / LPF 0-5 (A) NONE SEEN   WBC, UA 0-5 0 - 5 WBC/hpf   RBC / HPF 0-5 0 - 5 RBC/hpf   Bacteria, UA RARE (A) NONE SEEN   Crystals CA OXALATE CRYSTALS (A) NEGATIVE   Urine-Other MUCOUS PRESENT   Influenza panel by PCR (type A & B, H1N1)     Status: None   Collection Time: 11/21/15 10:50 AM  Result Value Ref Range   Influenza A By PCR NEGATIVE NEGATIVE   Influenza  B By PCR NEGATIVE NEGATIVE   H1N1 flu by pcr NOT DETECTED NOT DETECTED  Rapid strep screen (not at [facility])     Status: None   Collection Time: 11/21/15 10:50 AM  Result Value Ref Range   Streptococcus, Group A Screen (Direct) NEGATIVE NEGATIVE    MDM VSS Flu & strep swabs collected Discussed symptomatic treatment as well as regular use of antihistamines & nasal steroids. Pt requesting rx for meds; discussed that they are available OTC and insurance may not cover them but will send in rx for her anyway.  Assessment and Plan  A: 1. Other allergic rhinitis   2. Acute pharyngitis, unspecified etiology   3. Cough    P: Discharge home Rx tessalon, flonase, & phenergan Pt to get zyrtec OTC Given list of pregnancy safe OTC meds OB/gyn provider list given -- start prenatal care Salt water gargle & chloraseptic  spray PRN throat pain  Jorje Guild 11/21/2015, 10:19 AM

## 2015-11-21 NOTE — MAU Note (Signed)
Started yesterday, cough, sore throat and fever. (did not actually check temp- just feels hot/cold. Having some abd pain when she coughs.

## 2015-11-21 NOTE — Discharge Instructions (Signed)
Safe Medications in Pregnancy   Acne: Benzoyl Peroxide Salicylic Acid  Backache/Headache: Tylenol: 2 regular strength every 4 hours OR              2 Extra strength every 6 hours  Colds/Coughs/Allergies: Benadryl (alcohol free) 25 mg every 6 hours as needed Breath right strips Claritin Cepacol throat lozenges Chloraseptic throat spray Cold-Eeze- up to three times per day Cough drops, alcohol free Flonase (by prescription only) Guaifenesin Mucinex Robitussin DM (plain only, alcohol free) Saline nasal spray/drops Sudafed (pseudoephedrine) & Actifed ** use only after [redacted] weeks gestation and if you do not have high blood pressure Tylenol Vicks Vaporub Zinc lozenges Zyrtec   Constipation: Colace Ducolax suppositories Fleet enema Glycerin suppositories Metamucil Milk of magnesia Miralax Senokot Smooth move tea  Diarrhea: Kaopectate Imodium A-D  *NO pepto Bismol  Hemorrhoids: Anusol Anusol HC Preparation H Tucks  Indigestion: Tums Maalox Mylanta Zantac  Pepcid  Insomnia: Benadryl (alcohol free) 25mg  every 6 hours as needed Tylenol PM Unisom, no Gelcaps  Leg Cramps: Tums MagGel  Nausea/Vomiting:  Bonine Dramamine Emetrol Ginger extract Sea bands Meclizine  Nausea medication to take during pregnancy:  Unisom (doxylamine succinate 25 mg tablets) Take one tablet daily at bedtime. If symptoms are not adequately controlled, the dose can be increased to a maximum recommended dose of two tablets daily (1/2 tablet in the morning, 1/2 tablet mid-afternoon and one at bedtime). Vitamin B6 100mg  tablets. Take one tablet twice a day (up to 200 mg per day).  Skin Rashes: Aveeno products Benadryl cream or 25mg  every 6 hours as needed Calamine Lotion 1% cortisone cream  Yeast infection: Gyne-lotrimin 7 Monistat 7  Gum/tooth pain: Anbesol  **If taking multiple medications, please check labels to avoid duplicating the same active ingredients **take  medication as directed on the label ** Do not exceed 4000 mg of tylenol in 24 hours **Do not take medications that contain aspirin or ibuprofen    [facility]     Phone: [phone]  [facility] [place]  Phone: [place] [facility] [place] [facility] [place]  Phone: [facility]    Phone: [phone]   [facility] [place])    Phone: [phone]  [facility]    Phone: [phone]  [facility]    Phone: [place] [facility]  Phone: [place]    Phone: [phone]  [facility]   Phone: [phone]  [facility]    Phone: [phone]  [facility]     Phone: [phone]      Pharyngitis Pharyngitis is a sore throat (pharynx). There is redness, pain, and swelling of your throat. HOME CARE   Drink enough fluids to keep your pee (urine) clear or pale yellow.  Only take medicine as told by your doctor.  You may get sick again if you do not take medicine as told. Finish your medicines, even if you start to feel better.  Do not take aspirin.  Rest.  Rinse your mouth (gargle) with salt water ( tsp of salt per 1 qt of water) every 1-2 hours. This will help the pain.  If you are not at risk for choking, you can suck on hard candy or sore throat lozenges. GET HELP IF:  You have large, tender lumps on your neck.  You have a  rash.  You cough up green, yellow-brown, or bloody spit. GET HELP RIGHT AWAY IF:   You have a stiff neck.  You drool or cannot swallow liquids.  You throw up (vomit) or are not able to keep medicine or liquids down.  You have very bad pain that does not go away with medicine.  You have problems breathing (not from a  stuffy nose). MAKE SURE YOU:   Understand these instructions.  Will watch your condition.  Will get help right away if you are not doing well or get worse.   This information is not intended to replace advice given to you by your health care provider. Make sure you discuss any questions you have with your health care provider.   Document Released: 08/29/2007 Document Revised: 12/31/2012 Document Reviewed: 11/17/2012 Elsevier Interactive Patient Education 2016 [employer]    Allergic Rhinitis Allergic rhinitis is when the mucous membranes in the nose respond to allergens. Allergens are particles in the air that cause your body to have an allergic reaction. This causes you to release allergic antibodies. Through a chain of events, these eventually cause you to release histamine into the blood stream. Although meant to protect the body, it is this release of histamine that causes your discomfort, such as frequent sneezing, congestion, and an itchy, runny nose.  CAUSES Seasonal allergic rhinitis (hay fever) is caused by pollen allergens that may come from grasses, trees, and weeds. Year-round allergic rhinitis (perennial allergic rhinitis) is caused by allergens such as house dust mites, pet dander, and mold spores. SYMPTOMS  Nasal stuffiness (congestion).  Itchy, runny nose with sneezing and tearing of the eyes. DIAGNOSIS Your health care provider can help you determine the allergen or allergens that trigger your symptoms. If you and your health care provider are unable to determine the allergen, skin or blood testing may be used. Your health care provider will diagnose your condition after taking your health history and performing a physical exam. Your health care provider may assess you for other related conditions, such as asthma, pink eye, or an ear infection. TREATMENT Allergic rhinitis does not have a cure, but it can be controlled by:  Medicines that block allergy symptoms. These  may include allergy shots, nasal sprays, and oral antihistamines.  Avoiding the allergen. Hay fever may often be treated with antihistamines in pill or nasal spray forms. Antihistamines block the effects of histamine. There are over-the-counter medicines that may help with nasal congestion and swelling around the eyes. Check with your health care provider before taking or giving this medicine. If avoiding the allergen or the medicine prescribed do not work, there are many new medicines your health care provider can prescribe. Stronger medicine may be used if initial measures are ineffective. Desensitizing injections can be used if medicine and avoidance does not work. Desensitization is when a patient is given ongoing shots until the body becomes less sensitive to the allergen. Make sure you follow up with your health care provider if problems continue. HOME CARE INSTRUCTIONS It is not possible to completely avoid allergens, but you can reduce your symptoms by taking steps to limit your exposure to them. It helps to know exactly what you are allergic to so that you can avoid your specific triggers. SEEK MEDICAL CARE IF:  You have a fever.  You develop a cough that does not stop easily (persistent).  You have shortness of breath.  You start wheezing.  Symptoms interfere with normal daily activities.   This information  is not intended to replace advice given to you by your health care provider. Make sure you discuss any questions you have with your health care provider.   Document Released: 12/05/2000 Document Revised: 04/02/2014 Document Reviewed: 11/17/2012 Elsevier Interactive Patient Education [employer].

## 2015-11-23 LAB — CULTURE, GROUP A STREP (THRC)

## 2015-12-11 DIAGNOSIS — O10919 Unspecified pre-existing hypertension complicating pregnancy, unspecified trimester: Secondary | ICD-10-CM | POA: Insufficient documentation

## 2015-12-11 DIAGNOSIS — O0990 Supervision of high risk pregnancy, unspecified, unspecified trimester: Secondary | ICD-10-CM | POA: Insufficient documentation

## 2015-12-13 ENCOUNTER — Encounter: Payer: Self-pay | Admitting: Obstetrics and Gynecology

## 2015-12-13 ENCOUNTER — Ambulatory Visit (INDEPENDENT_AMBULATORY_CARE_PROVIDER_SITE_OTHER): Payer: Medicaid Other | Admitting: Obstetrics and Gynecology

## 2015-12-13 VITALS — BP 137/92 | HR 102 | Temp 97.8°F | Wt 254.2 lb

## 2015-12-13 DIAGNOSIS — O10912 Unspecified pre-existing hypertension complicating pregnancy, second trimester: Secondary | ICD-10-CM | POA: Diagnosis not present

## 2015-12-13 DIAGNOSIS — Z3490 Encounter for supervision of normal pregnancy, unspecified, unspecified trimester: Secondary | ICD-10-CM

## 2015-12-13 DIAGNOSIS — N9489 Other specified conditions associated with female genital organs and menstrual cycle: Secondary | ICD-10-CM

## 2015-12-13 DIAGNOSIS — O09522 Supervision of elderly multigravida, second trimester: Secondary | ICD-10-CM

## 2015-12-13 DIAGNOSIS — Z23 Encounter for immunization: Secondary | ICD-10-CM | POA: Diagnosis not present

## 2015-12-13 DIAGNOSIS — O99212 Obesity complicating pregnancy, second trimester: Secondary | ICD-10-CM | POA: Diagnosis not present

## 2015-12-13 DIAGNOSIS — O99210 Obesity complicating pregnancy, unspecified trimester: Secondary | ICD-10-CM | POA: Insufficient documentation

## 2015-12-13 DIAGNOSIS — O10919 Unspecified pre-existing hypertension complicating pregnancy, unspecified trimester: Secondary | ICD-10-CM

## 2015-12-13 MED ORDER — TETANUS-DIPHTH-ACELL PERTUSSIS 5-2.5-18.5 LF-MCG/0.5 IM SUSP: 0.5000 mL | Freq: Once | INTRAMUSCULAR | Status: DC

## 2015-12-13 MED ORDER — PRENATAL MULTIVITAMIN CH: 1.0000 | ORAL_TABLET | Freq: Every day | ORAL | 12 refills | Status: DC

## 2015-12-13 MED ORDER — ASPIRIN EC 81 MG PO TBEC: 81.0000 mg | DELAYED_RELEASE_TABLET | Freq: Every day | ORAL | 6 refills | Status: DC

## 2015-12-13 NOTE — Patient Instructions (Signed)
Second Trimester of Pregnancy The second trimester is from week 13 through week 28, months 4 through 6. The second trimester is often a time when you feel your best. Your body has also adjusted to being pregnant, and you begin to feel better physically. Usually, morning sickness has lessened or quit completely, you may have more energy, and you may have an increase in appetite. The second trimester is also a time when the fetus is growing rapidly. At the end of the sixth month, the fetus is about 9 inches long and weighs about 1 pounds. You will likely begin to feel the baby move (quickening) between 18 and 20 weeks of the pregnancy. BODY CHANGES Your body goes through many changes during pregnancy. The changes vary from woman to woman.   Your weight will continue to increase. You will notice your lower abdomen bulging out.  You may begin to get stretch marks on your hips, abdomen, and breasts.  You may develop headaches that can be relieved by medicines approved by your health care provider.  You may urinate more often because the fetus is pressing on your bladder.  You may develop or continue to have heartburn as a result of your pregnancy.  You may develop constipation because certain hormones are causing the muscles that push waste through your intestines to slow down.  You may develop hemorrhoids or swollen, bulging veins (varicose veins).  You may have back pain because of the weight gain and pregnancy hormones relaxing your joints between the bones in your pelvis and as a result of a shift in weight and the muscles that support your balance.  Your breasts will continue to grow and be tender.  Your gums may bleed and may be sensitive to brushing and flossing.  Dark spots or blotches (chloasma, mask of pregnancy) may develop on your face. This will likely fade after the baby is born.  A dark line from your belly button to the pubic area (linea nigra) may appear. This will likely  fade after the baby is born.  You may have changes in your hair. These can include thickening of your hair, rapid growth, and changes in texture. Some women also have hair loss during or after pregnancy, or hair that feels dry or thin. Your hair will most likely return to normal after your baby is born. WHAT TO EXPECT AT YOUR PRENATAL VISITS During a routine prenatal visit:  You will be weighed to make sure you and the fetus are growing normally.  Your blood pressure will be taken.  Your abdomen will be measured to track your baby's growth.  The fetal heartbeat will be listened to.  Any test results from the previous visit will be discussed. Your health care provider may ask you:  How you are feeling.  If you are feeling the baby move.  If you have had any abnormal symptoms, such as leaking fluid, bleeding, severe headaches, or abdominal cramping.  If you are using any tobacco products, including cigarettes, chewing tobacco, and electronic cigarettes.  If you have any questions. Other tests that may be performed during your second trimester include:  Blood tests that check for:  Low iron levels (anemia).  Gestational diabetes (between 24 and 28 weeks).  Rh antibodies.  Urine tests to check for infections, diabetes, or protein in the urine.  An ultrasound to confirm the proper growth and development of the baby.  An amniocentesis to check for possible genetic problems.  Fetal screens for spina bifida   and Down syndrome.  HIV (human immunodeficiency virus) testing. Routine prenatal testing includes screening for HIV, unless you choose not to have this test. HOME CARE INSTRUCTIONS   Avoid all smoking, herbs, alcohol, and unprescribed drugs. These chemicals affect the formation and growth of the baby.  Do not use any tobacco products, including cigarettes, chewing tobacco, and electronic cigarettes. If you need help quitting, ask your health care provider. You may receive  counseling support and other resources to help you quit.  Follow your health care provider's instructions regarding medicine use. There are medicines that are either safe or unsafe to take during pregnancy.  Exercise only as directed by your health care provider. Experiencing uterine cramps is a good sign to stop exercising.  Continue to eat regular, healthy meals.  Wear a good support bra for breast tenderness.  Do not use hot tubs, steam rooms, or saunas.  Wear your seat belt at all times when driving.  Avoid raw meat, uncooked cheese, cat litter boxes, and soil used by cats. These carry germs that can cause birth defects in the baby.  Take your prenatal vitamins.  Take 1500-2000 mg of calcium daily starting at the 20th week of pregnancy until you deliver your baby.  Try taking a stool softener (if your health care provider approves) if you develop constipation. Eat more high-fiber foods, such as fresh vegetables or fruit and whole grains. Drink plenty of fluids to keep your urine clear or pale yellow.  Take warm sitz baths to soothe any pain or discomfort caused by hemorrhoids. Use hemorrhoid cream if your health care provider approves.  If you develop varicose veins, wear support hose. Elevate your feet for 15 minutes, 3-4 times a day. Limit salt in your diet.  Avoid heavy lifting, wear low heel shoes, and practice good posture.  Rest with your legs elevated if you have leg cramps or low back pain.  Visit your dentist if you have not gone yet during your pregnancy. Use a soft toothbrush to brush your teeth and be gentle when you floss.  A sexual relationship may be continued unless your health care provider directs you otherwise.  Continue to go to all your prenatal visits as directed by your health care provider. SEEK MEDICAL CARE IF:   You have dizziness.  You have mild pelvic cramps, pelvic pressure, or nagging pain in the abdominal area.  You have persistent nausea,  vomiting, or diarrhea.  You have a bad smelling vaginal discharge.  You have pain with urination. SEEK IMMEDIATE MEDICAL CARE IF:   You have a fever.  You are leaking fluid from your vagina.  You have spotting or bleeding from your vagina.  You have severe abdominal cramping or pain.  You have rapid weight gain or loss.  You have shortness of breath with chest pain.  You notice sudden or extreme swelling of your face, hands, ankles, feet, or legs.  You have not felt your baby move in over an hour.  You have severe headaches that do not go away with medicine.  You have vision changes.   This information is not intended to replace advice given to you by your health care provider. Make sure you discuss any questions you have with your health care provider.   Document Released: 03/06/2001 Document Revised: 04/02/2014 Document Reviewed: 05/13/2012 Elsevier Interactive Patient Education 2016 [employer]  Contraception Choices Contraception (birth control) is the use of any methods or devices to prevent pregnancy. Below are some methods to   help avoid pregnancy. HORMONAL METHODS   Contraceptive implant. This is a thin, plastic tube containing progesterone hormone. It does not contain estrogen hormone. Your health care provider inserts the tube in the inner part of the upper arm. The tube can remain in place for up to 3 years. After 3 years, the implant must be removed. The implant prevents the ovaries from releasing an egg (ovulation), thickens the cervical mucus to prevent sperm from entering the uterus, and thins the lining of the inside of the uterus.  Progesterone-only injections. These injections are given every 3 months by your health care provider to prevent pregnancy. This synthetic progesterone hormone stops the ovaries from releasing eggs. It also thickens cervical mucus and changes the uterine lining. This makes it harder for sperm to survive in the uterus.  Birth  control pills. These pills contain estrogen and progesterone hormone. They work by preventing the ovaries from releasing eggs (ovulation). They also cause the cervical mucus to thicken, preventing the sperm from entering the uterus. Birth control pills are prescribed by a health care provider.Birth control pills can also be used to treat heavy periods.  Minipill. This type of birth control pill contains only the progesterone hormone. They are taken every day of each month and must be prescribed by your health care provider.  Birth control patch. The patch contains hormones similar to those in birth control pills. It must be changed once a week and is prescribed by a health care provider.  Vaginal ring. The ring contains hormones similar to those in birth control pills. It is left in the vagina for 3 weeks, removed for 1 week, and then a new one is put back in place. The patient must be comfortable inserting and removing the ring from the vagina.A health care provider's prescription is necessary.  Emergency contraception. Emergency contraceptives prevent pregnancy after unprotected sexual intercourse. This pill can be taken right after sex or up to 5 days after unprotected sex. It is most effective the sooner you take the pills after having sexual intercourse. Most emergency contraceptive pills are available without a prescription. Check with your pharmacist. Do not use emergency contraception as your only form of birth control. BARRIER METHODS   Female condom. This is a thin sheath (latex or rubber) that is worn over the penis during sexual intercourse. It can be used with spermicide to increase effectiveness.  Female condom. This is a soft, loose-fitting sheath that is put into the vagina before sexual intercourse.  Diaphragm. This is a soft, latex, dome-shaped barrier that must be fitted by a health care provider. It is inserted into the vagina, along with a spermicidal jelly. It is inserted before  intercourse. The diaphragm should be left in the vagina for 6 to 8 hours after intercourse.  Cervical cap. This is a round, soft, latex or plastic cup that fits over the cervix and must be fitted by a health care provider. The cap can be left in place for up to 48 hours after intercourse.  Sponge. This is a soft, circular piece of polyurethane foam. The sponge has spermicide in it. It is inserted into the vagina after wetting it and before sexual intercourse.  Spermicides. These are chemicals that kill or block sperm from entering the cervix and uterus. They come in the form of creams, jellies, suppositories, foam, or tablets. They do not require a prescription. They are inserted into the vagina with an applicator before having sexual intercourse. The process must be repeated every   time you have sexual intercourse. INTRAUTERINE CONTRACEPTION  Intrauterine device (IUD). This is a T-shaped device that is put in a woman's uterus during a menstrual period to prevent pregnancy. There are 2 types:  Copper IUD. This type of IUD is wrapped in copper wire and is placed inside the uterus. Copper makes the uterus and fallopian tubes produce a fluid that kills sperm. It can stay in place for 10 years.  Hormone IUD. This type of IUD contains the hormone progestin (synthetic progesterone). The hormone thickens the cervical mucus and prevents sperm from entering the uterus, and it also thins the uterine lining to prevent implantation of a fertilized egg. The hormone can weaken or kill the sperm that get into the uterus. It can stay in place for 3-5 years, depending on which type of IUD is used. PERMANENT METHODS OF CONTRACEPTION  Female tubal ligation. This is when the woman's fallopian tubes are surgically sealed, tied, or blocked to prevent the egg from traveling to the uterus.  Hysteroscopic sterilization. This involves placing a small coil or insert into each fallopian tube. Your doctor uses a technique  called hysteroscopy to do the procedure. The device causes scar tissue to form. This results in permanent blockage of the fallopian tubes, so the sperm cannot fertilize the egg. It takes about 3 months after the procedure for the tubes to become blocked. You must use another form of birth control for these 3 months.  Female sterilization. This is when the female has the tubes that carry sperm tied off (vasectomy).This blocks sperm from entering the vagina during sexual intercourse. After the procedure, the man can still ejaculate fluid (semen). NATURAL PLANNING METHODS  Natural family planning. This is not having sexual intercourse or using a barrier method (condom, diaphragm, cervical cap) on days the woman could become pregnant.  Calendar method. This is keeping track of the length of each menstrual cycle and identifying when you are fertile.  Ovulation method. This is avoiding sexual intercourse during ovulation.  Symptothermal method. This is avoiding sexual intercourse during ovulation, using a thermometer and ovulation symptoms.  Post-ovulation method. This is timing sexual intercourse after you have ovulated. Regardless of which type or method of contraception you choose, it is important that you use condoms to protect against the transmission of sexually transmitted infections (STIs). Talk with your health care provider about which form of contraception is most appropriate for you.   This information is not intended to replace advice given to you by your health care provider. Make sure you discuss any questions you have with your health care provider.   Document Released: 03/12/2005 Document Revised: 03/17/2013 Document Reviewed: 09/04/2012 Elsevier Interactive Patient Education 2016 [employer]  Breastfeeding Deciding to breastfeed is one of the best choices you can make for you and your baby. A change in hormones during pregnancy causes your breast tissue to grow and increases the  number and size of your milk ducts. These hormones also allow proteins, sugars, and fats from your blood supply to make breast milk in your milk-producing glands. Hormones prevent breast milk from being released before your baby is born as well as prompt milk flow after birth. Once breastfeeding has begun, thoughts of your baby, as well as his or her sucking or crying, can stimulate the release of milk from your milk-producing glands.  BENEFITS OF BREASTFEEDING For Your Baby  Your first milk (colostrum) helps your baby's digestive system function better.  There are antibodies in your milk that help   your baby fight off infections.  Your baby has a lower incidence of asthma, allergies, and sudden infant death syndrome.  The nutrients in breast milk are better for your baby than infant formulas and are designed uniquely for your baby's needs.  Breast milk improves your baby's brain development.  Your baby is less likely to develop other conditions, such as childhood obesity, asthma, or type 2 diabetes mellitus. For You  Breastfeeding helps to create a very special bond between you and your baby.  Breastfeeding is convenient. Breast milk is always available at the correct temperature and costs nothing.  Breastfeeding helps to burn calories and helps you lose the weight gained during pregnancy.  Breastfeeding makes your uterus contract to its prepregnancy size faster and slows bleeding (lochia) after you give birth.   Breastfeeding helps to lower your risk of developing type 2 diabetes mellitus, osteoporosis, and breast or ovarian cancer later in life. SIGNS THAT YOUR BABY IS HUNGRY Early Signs of Hunger  Increased alertness or activity.  Stretching.  Movement of the head from side to side.  Movement of the head and opening of the mouth when the corner of the mouth or cheek is stroked (rooting).  Increased sucking sounds, smacking lips, cooing, sighing, or squeaking.  Hand-to-mouth  movements.  Increased sucking of fingers or hands. Late Signs of Hunger  Fussing.  Intermittent crying. Extreme Signs of Hunger Signs of extreme hunger will require calming and consoling before your baby will be able to breastfeed successfully. Do not wait for the following signs of extreme hunger to occur before you initiate breastfeeding:  Restlessness.  A loud, strong cry.  Screaming. BREASTFEEDING BASICS Breastfeeding Initiation  Find a comfortable place to sit or lie down, with your neck and back well supported.  Place a pillow or rolled up blanket under your baby to bring him or her to the level of your breast (if you are seated). Nursing pillows are specially designed to help support your arms and your baby while you breastfeed.  Make sure that your baby's abdomen is facing your abdomen.  Gently massage your breast. With your fingertips, massage from your chest wall toward your nipple in a circular motion. This encourages milk flow. You may need to continue this action during the feeding if your milk flows slowly.  Support your breast with 4 fingers underneath and your thumb above your nipple. Make sure your fingers are well away from your nipple and your baby's mouth.  Stroke your baby's lips gently with your finger or nipple.  When your baby's mouth is open wide enough, quickly bring your baby to your breast, placing your entire nipple and as much of the colored area around your nipple (areola) as possible into your baby's mouth.  More areola should be visible above your baby's upper lip than below the lower lip.  Your baby's tongue should be between his or her lower gum and your breast.  Ensure that your baby's mouth is correctly positioned around your nipple (latched). Your baby's lips should create a seal on your breast and be turned out (everted).  It is common for your baby to suck about 2-3 minutes in order to start the flow of breast milk. Latching Teaching  your baby how to latch on to your breast properly is very important. An improper latch can cause nipple pain and decreased milk supply for you and poor weight gain in your baby. Also, if your baby is not latched onto your nipple properly, he   or she may swallow some air during feeding. This can make your baby fussy. Burping your baby when you switch breasts during the feeding can help to get rid of the air. However, teaching your baby to latch on properly is still the best way to prevent fussiness from swallowing air while breastfeeding. Signs that your baby has successfully latched on to your nipple:  Silent tugging or silent sucking, without causing you pain.  Swallowing heard between every 3-4 sucks.  Muscle movement above and in front of his or her ears while sucking. Signs that your baby has not successfully latched on to nipple:  Sucking sounds or smacking sounds from your baby while breastfeeding.  Nipple pain. If you think your baby has not latched on correctly, slip your finger into the corner of your baby's mouth to break the suction and place it between your baby's gums. Attempt breastfeeding initiation again. Signs of Successful Breastfeeding Signs from your baby:  A gradual decrease in the number of sucks or complete cessation of sucking.  Falling asleep.  Relaxation of his or her body.  Retention of a small amount of milk in his or her mouth.  Letting go of your breast by himself or herself. Signs from you:  Breasts that have increased in firmness, weight, and size 1-3 hours after feeding.  Breasts that are softer immediately after breastfeeding.  Increased milk volume, as well as a change in milk consistency and color by the fifth day of breastfeeding.  Nipples that are not sore, cracked, or bleeding. Signs That Your Baby is Getting Enough Milk  Wetting at least 3 diapers in a 24-hour period. The urine should be clear and pale yellow by age 5 days.  At least 3  stools in a 24-hour period by age 5 days. The stool should be soft and yellow.  At least 3 stools in a 24-hour period by age 7 days. The stool should be seedy and yellow.  No loss of weight greater than 10% of birth weight during the first 3 days of age.  Average weight gain of 4-7 ounces (113-198 g) per week after age 4 days.  Consistent daily weight gain by age 5 days, without weight loss after the age of 2 weeks. After a feeding, your baby may spit up a small amount. This is common. BREASTFEEDING FREQUENCY AND DURATION Frequent feeding will help you make more milk and can prevent sore nipples and breast engorgement. Breastfeed when you feel the need to reduce the fullness of your breasts or when your baby shows signs of hunger. This is called "breastfeeding on demand." Avoid introducing a pacifier to your baby while you are working to establish breastfeeding (the first 4-6 weeks after your baby is born). After this time you may choose to use a pacifier. Research has shown that pacifier use during the first year of a baby's life decreases the risk of sudden infant death syndrome (SIDS). Allow your baby to feed on each breast as long as he or she wants. Breastfeed until your baby is finished feeding. When your baby unlatches or falls asleep while feeding from the first breast, offer the second breast. Because newborns are often sleepy in the first few weeks of life, you may need to awaken your baby to get him or her to feed. Breastfeeding times will vary from baby to baby. However, the following rules can serve as a guide to help you ensure that your baby is properly fed:  Newborns (babies 4 weeks   of age or younger) may breastfeed every 1-3 hours.  Newborns should not go longer than 3 hours during the day or 5 hours during the night without breastfeeding.  You should breastfeed your baby a minimum of 8 times in a 24-hour period until you begin to introduce solid foods to your baby at around 6  months of age. BREAST MILK PUMPING Pumping and storing breast milk allows you to ensure that your baby is exclusively fed your breast milk, even at times when you are unable to breastfeed. This is especially important if you are going back to work while you are still breastfeeding or when you are not able to be present during feedings. Your lactation consultant can give you guidelines on how long it is safe to store breast milk. A breast pump is a machine that allows you to pump milk from your breast into a sterile bottle. The pumped breast milk can then be stored in a refrigerator or freezer. Some breast pumps are operated by hand, while others use electricity. Ask your lactation consultant which type will work best for you. Breast pumps can be purchased, but some hospitals and breastfeeding support groups lease breast pumps on a monthly basis. A lactation consultant can teach you how to hand express breast milk, if you prefer not to use a pump. CARING FOR YOUR BREASTS WHILE YOU BREASTFEED Nipples can become dry, cracked, and sore while breastfeeding. The following recommendations can help keep your breasts moisturized and healthy:  Avoid using soap on your nipples.  Wear a supportive bra. Although not required, special nursing bras and tank tops are designed to allow access to your breasts for breastfeeding without taking off your entire bra or top. Avoid wearing underwire-style bras or extremely tight bras.  Air dry your nipples for 3-4minutes after each feeding.  Use only cotton bra pads to absorb leaked breast milk. Leaking of breast milk between feedings is normal.  Use lanolin on your nipples after breastfeeding. Lanolin helps to maintain your skin's normal moisture barrier. If you use pure lanolin, you do not need to wash it off before feeding your baby again. Pure lanolin is not toxic to your baby. You may also hand express a few drops of breast milk and gently massage that milk into your  nipples and allow the milk to air dry. In the first few weeks after giving birth, some women experience extremely full breasts (engorgement). Engorgement can make your breasts feel heavy, warm, and tender to the touch. Engorgement peaks within 3-5 days after you give birth. The following recommendations can help ease engorgement:  Completely empty your breasts while breastfeeding or pumping. You may want to start by applying warm, moist heat (in the shower or with warm water-soaked hand towels) just before feeding or pumping. This increases circulation and helps the milk flow. If your baby does not completely empty your breasts while breastfeeding, pump any extra milk after he or she is finished.  Wear a snug bra (nursing or regular) or tank top for 1-2 days to signal your body to slightly decrease milk production.  Apply ice packs to your breasts, unless this is too uncomfortable for you.  Make sure that your baby is latched on and positioned properly while breastfeeding. If engorgement persists after 48 hours of following these recommendations, contact your health care provider or a lactation consultant. OVERALL HEALTH CARE RECOMMENDATIONS WHILE BREASTFEEDING  Eat healthy foods. Alternate between meals and snacks, eating 3 of each per day. Because what   you eat affects your breast milk, some of the foods may make your baby more irritable than usual. Avoid eating these foods if you are sure that they are negatively affecting your baby.  Drink milk, fruit juice, and water to satisfy your thirst (about 10 glasses a day).  Rest often, relax, and continue to take your prenatal vitamins to prevent fatigue, stress, and anemia.  Continue breast self-awareness checks.  Avoid chewing and smoking tobacco. Chemicals from cigarettes that pass into breast milk and exposure to secondhand smoke may harm your baby.  Avoid alcohol and drug use, including marijuana. Some medicines that may be harmful to your  baby can pass through breast milk. It is important to ask your health care provider before taking any medicine, including all over-the-counter and prescription medicine as well as vitamin and herbal supplements. It is possible to become pregnant while breastfeeding. If birth control is desired, ask your health care provider about options that will be safe for your baby. SEEK MEDICAL CARE IF:  You feel like you want to stop breastfeeding or have become frustrated with breastfeeding.  You have painful breasts or nipples.  Your nipples are cracked or bleeding.  Your breasts are red, tender, or warm.  You have a swollen area on either breast.  You have a fever or chills.  You have nausea or vomiting.  You have drainage other than breast milk from your nipples.  Your breasts do not become full before feedings by the fifth day after you give birth.  You feel sad and depressed.  Your baby is too sleepy to eat well.  Your baby is having trouble sleeping.   Your baby is wetting less than 3 diapers in a 24-hour period.  Your baby has less than 3 stools in a 24-hour period.  Your baby's skin or the white part of his or her eyes becomes yellow.   Your baby is not gaining weight by 5 days of age. SEEK IMMEDIATE MEDICAL CARE IF:  Your baby is overly tired (lethargic) and does not want to wake up and feed.  Your baby develops an unexplained fever.   This information is not intended to replace advice given to you by your health care provider. Make sure you discuss any questions you have with your health care provider.   Document Released: 03/12/2005 Document Revised: 12/01/2014 Document Reviewed: 09/03/2012 Elsevier Interactive Patient Education 2016 [employer]  

## 2015-12-13 NOTE — Addendum Note (Signed)
Addended by: Manuela Schwartz C on: 12/13/2015 09:28 AM   Modules accepted: Orders

## 2015-12-13 NOTE — Addendum Note (Signed)
Addended by: Mora Bellman on: 12/13/2015 09:20 AM   Modules accepted: Orders

## 2015-12-13 NOTE — Progress Notes (Signed)
  Subjective:    Nichole Green is a G3P2002 [redacted]w[redacted]d being seen today for her first obstetrical visit.  Her obstetrical history is significant for advanced maternal age and CHTN and the presence of a large adnexal mass. Patient does intend to breast feed. Pregnancy history fully reviewed.  Patient reports no complaints.  There were no vitals filed for this visit.  HISTORY: OB History  Gravida Para Term Preterm AB Living  3 2 2  0 0 2  SAB TAB Ectopic Multiple Live Births  0 0 0 0 2    # Outcome Date GA Lbr Len/2nd Weight Sex Delivery Anes PTL Lv  3 Current           2 Term      Vag-Spont     1 Term      Vag-Spont       Obstetric Comments  Elevated BP with preg   Past Medical History:  Diagnosis Date  . Hypertension   . Infection    UTI  . Pregnancy induced hypertension    Past Surgical History:  Procedure Laterality Date  . BREAST REDUCTION SURGERY     Family History  Problem Relation Age of Onset  . Hypertension Mother   . Diabetes Mother      Exam    Uterus:   approximately 14-week size  Pelvic Exam:    Perineum: Normal Perineum   Vulva: normal   Vagina:  normal mucosa, normal discharge   pH:    Cervix: multiparous appearance and cervix is closed and long   Adnexa: no mass, fullness, tenderness   Bony Pelvis: gynecoid  System: Breast:  normal appearance, no masses or tenderness   Skin: normal coloration and turgor, no rashes    Neurologic: oriented, normal, no focal deficits   Extremities: normal strength, tone, and muscle mass   HEENT extra ocular movement intact   Mouth/Teeth mucous membranes moist, pharynx normal without lesions and dental hygiene good   Neck supple and no masses   Cardiovascular: regular rate and rhythm   Respiratory:  chest clear, no wheezing, crepitations, rhonchi, normal symmetric air entry   Abdomen: soft, non-tender; bowel sounds normal; no masses,  no organomegaly   Urinary:       Assessment:    Pregnancy: [id number] Patient  Active Problem List   Diagnosis Date Noted  . Supervision of normal pregnancy 12/11/2015  . Chronic hypertension in pregnancy 12/11/2015  . Adnexal mass 11/03/2015  . AMA (advanced maternal age) multigravida 35+ 10/31/2015  . Chronic hypertension during pregnancy, antepartum 10/31/2015        Plan:     Initial labs drawn. Prenatal vitamins. Problem list reviewed and updated. Genetic Screening discussed NIPS: ordered.  Ultrasound discussed; fetal survey: requested. Follow up ultrasound for evaluation of adnexal mass ordered Flu vaccine today Rx ASA given Patient to return fasting for 2 hour glucola secondary to maternal obesity  Follow up in 4 weeks. 50% of 30 min visit spent on counseling and coordination of care.     Demaya Hardge 12/13/2015

## 2015-12-14 ENCOUNTER — Encounter (HOSPITAL_COMMUNITY): Payer: Self-pay | Admitting: Obstetrics and Gynecology

## 2015-12-14 LAB — COMPREHENSIVE METABOLIC PANEL
A/G RATIO: 1.5 (ref 1.2–2.2)
ALBUMIN: 4 g/dL (ref 3.5–5.5)
ALK PHOS: 70 IU/L (ref 39–117)
ALT: 10 IU/L (ref 0–32)
AST: 14 IU/L (ref 0–40)
BUN/Creatinine Ratio: 15 (ref 9–23)
BUN: 8 mg/dL (ref 6–20)
CHLORIDE: 98 mmol/L (ref 96–106)
CO2: 22 mmol/L (ref 18–29)
Calcium: 9.8 mg/dL (ref 8.7–10.2)
Creatinine, Ser: 0.55 mg/dL — ABNORMAL LOW (ref 0.57–1.00)
GFR calc non Af Amer: 122 mL/min/{1.73_m2} (ref >59)
GFR, EST AFRICAN AMERICAN: 141 mL/min/{1.73_m2} (ref >59)
GLOBULIN, TOTAL: 2.6 g/dL (ref 1.5–4.5)
GLUCOSE: 92 mg/dL (ref 65–99)
POTASSIUM: 4.5 mmol/L (ref 3.5–5.2)
Sodium: 138 mmol/L (ref 134–144)
TOTAL PROTEIN: 6.6 g/dL (ref 6.0–8.5)

## 2015-12-14 LAB — PRENATAL PROFILE I(LABCORP)
ANTIBODY SCREEN: NEGATIVE
BASOS: 0 %
Basophils Absolute: 0 10*3/uL (ref 0.0–0.2)
EOS (ABSOLUTE): 0.1 10*3/uL (ref 0.0–0.4)
EOS: 1 %
HEMOGLOBIN: 11.9 g/dL (ref 11.1–15.9)
Hematocrit: 36.8 % (ref 34.0–46.6)
Hepatitis B Surface Ag: NEGATIVE
Immature Grans (Abs): 0 10*3/uL (ref 0.0–0.1)
Immature Granulocytes: 0 %
LYMPHS ABS: 3.1 10*3/uL (ref 0.7–3.1)
Lymphs: 31 %
MCH: 24.2 pg — AB (ref 26.6–33.0)
MCHC: 32.3 g/dL (ref 31.5–35.7)
MCV: 75 fL — AB (ref 79–97)
MONOS ABS: 0.5 10*3/uL (ref 0.1–0.9)
Monocytes: 5 %
NEUTROS ABS: 6.3 10*3/uL (ref 1.4–7.0)
NEUTROS PCT: 63 %
Platelets: 247 10*3/uL (ref 150–379)
RBC: 4.92 x10E6/uL (ref 3.77–5.28)
RDW: 17.5 % — ABNORMAL HIGH (ref 12.3–15.4)
RH TYPE: POSITIVE
RPR: NONREACTIVE
Rubella Antibodies, IGG: 2.2 index (ref >0.99)
WBC: 10 10*3/uL (ref 3.4–10.8)

## 2015-12-14 LAB — HIV ANTIBODY (ROUTINE TESTING W REFLEX): HIV Screen 4th Generation wRfx: NONREACTIVE

## 2015-12-14 LAB — VARICELLA ZOSTER ANTIBODY, IGG: Varicella zoster IgG: 1085 index (ref >165)

## 2015-12-14 LAB — PROTEIN / CREATININE RATIO, URINE
Creatinine, Urine: 283.8 mg/dL
PROTEIN/CREAT RATIO: 265 mg/g{creat} — AB (ref 0–200)
Protein, Ur: 75.3 mg/dL

## 2015-12-15 LAB — PAP IG W/ RFLX HPV ASCU: PAP Smear Comment: 0

## 2015-12-23 ENCOUNTER — Other Ambulatory Visit: Payer: Self-pay | Admitting: Obstetrics and Gynecology

## 2015-12-23 ENCOUNTER — Ambulatory Visit (HOSPITAL_COMMUNITY)
Admission: RE | Admit: 2015-12-23 | Discharge: 2015-12-23 | Disposition: A | Discharge status: Home / Self Care | Payer: Medicaid Other | Source: Ambulatory Visit | Attending: Obstetrics and Gynecology | Admitting: Obstetrics and Gynecology

## 2015-12-23 DIAGNOSIS — N83209 Unspecified ovarian cyst, unspecified side: Secondary | ICD-10-CM

## 2015-12-23 DIAGNOSIS — Z3490 Encounter for supervision of normal pregnancy, unspecified, unspecified trimester: Secondary | ICD-10-CM

## 2015-12-23 DIAGNOSIS — N83201 Unspecified ovarian cyst, right side: Secondary | ICD-10-CM | POA: Insufficient documentation

## 2015-12-23 DIAGNOSIS — Z3A14 14 weeks gestation of pregnancy: Secondary | ICD-10-CM

## 2015-12-23 DIAGNOSIS — O3481 Maternal care for other abnormalities of pelvic organs, first trimester: Secondary | ICD-10-CM | POA: Diagnosis not present

## 2015-12-23 DIAGNOSIS — N9489 Other specified conditions associated with female genital organs and menstrual cycle: Secondary | ICD-10-CM

## 2015-12-23 DIAGNOSIS — O3482 Maternal care for other abnormalities of pelvic organs, second trimester: Secondary | ICD-10-CM

## 2015-12-23 DIAGNOSIS — Z3A13 13 weeks gestation of pregnancy: Secondary | ICD-10-CM

## 2015-12-23 IMAGING — US US MFM OB TRANSVAGINAL
1 series · 15 of 28 positions shown · non-contrast
Comparison: none

[Series 1: us mfm ob transvaginal · 61 acquisitions, 15 frames shown]
[im 1/61]
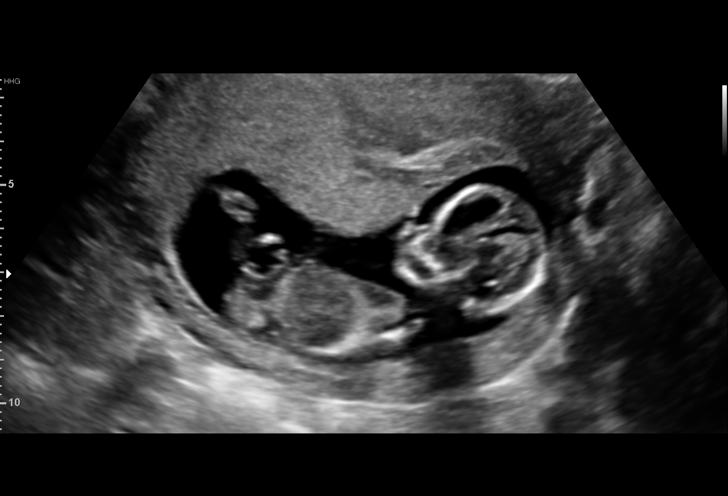
[im 5/61]
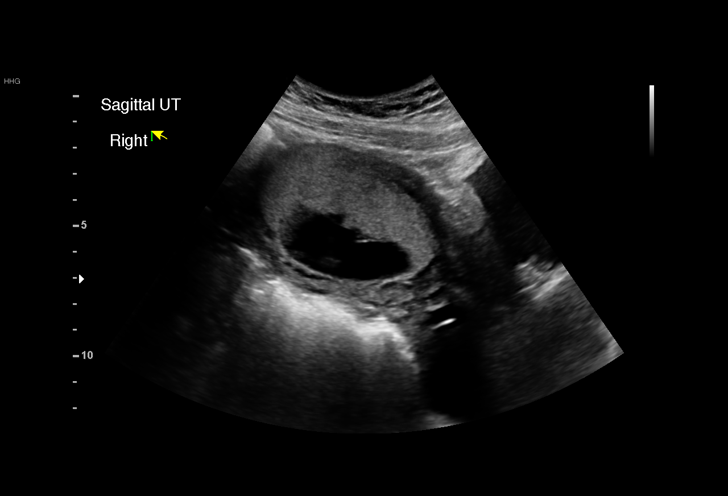
[im 9/61]
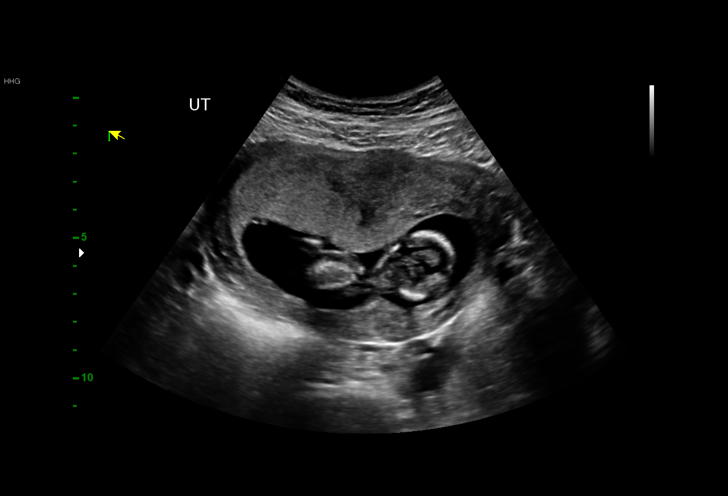
[im 14/61]
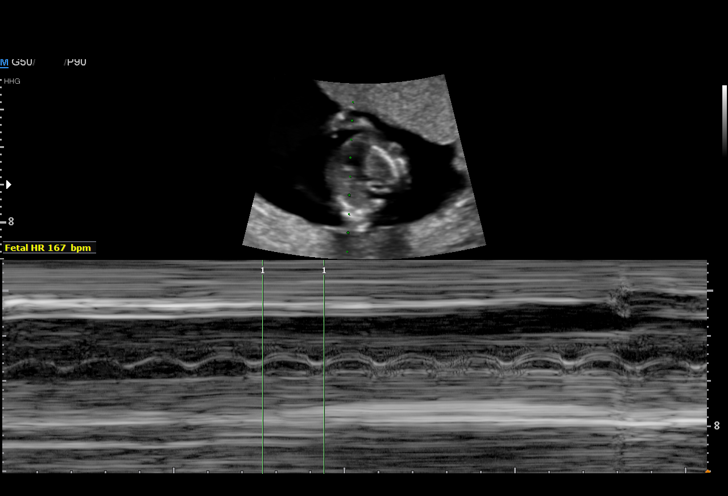
[im 18/61]
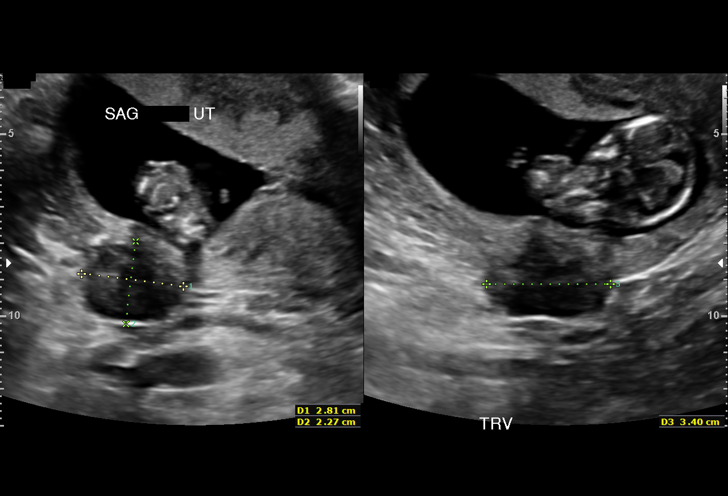
[im 23/61]
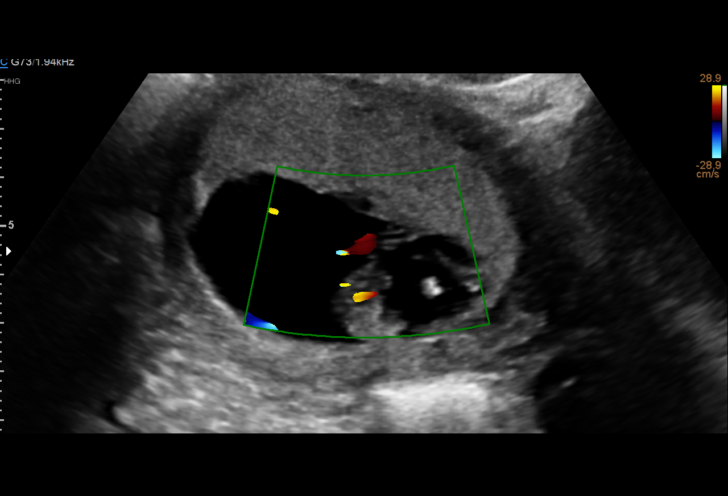
[im 27/61]
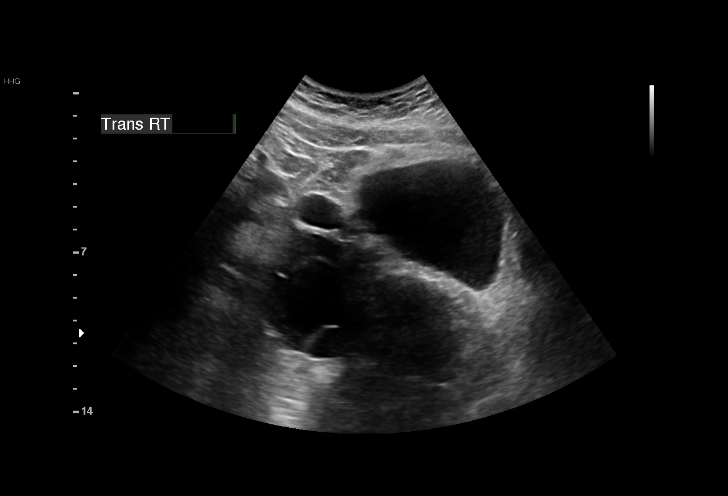
[im 32/61]
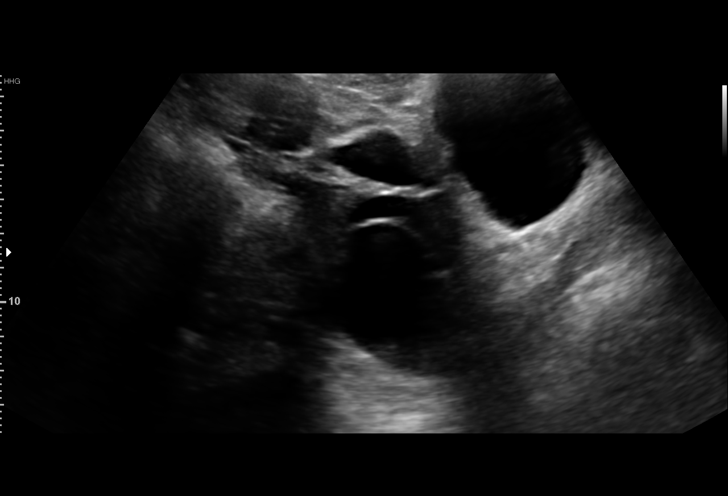
[im 34/61]
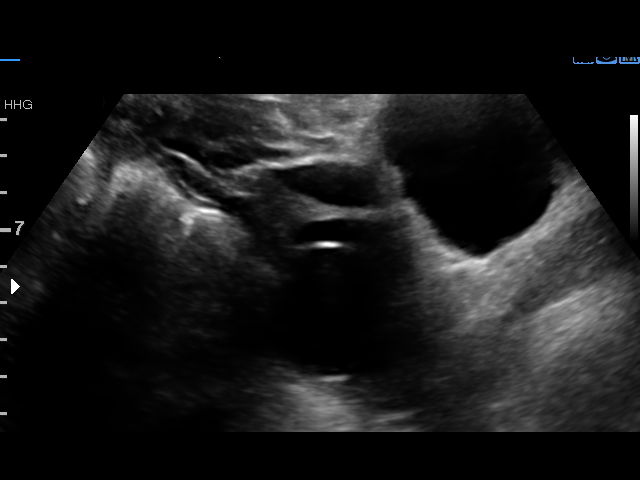
[im 38/61]
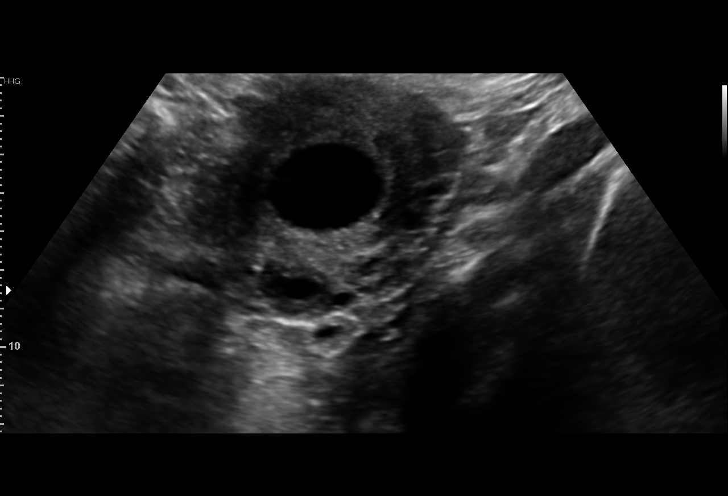
[im 43/61]
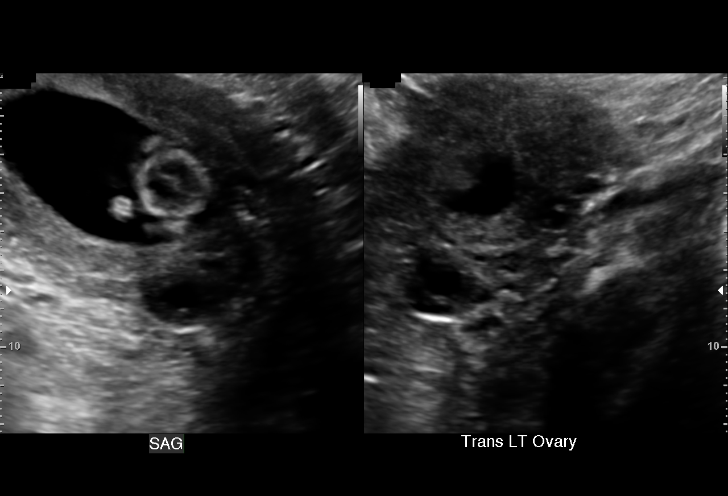
[im 47/61]
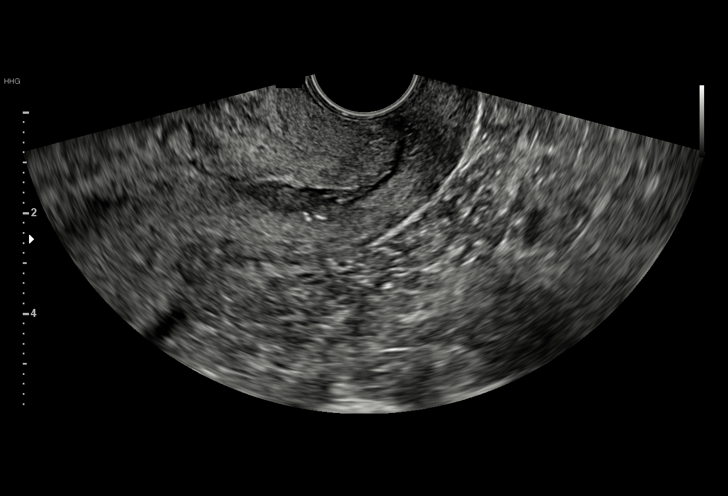
[im 52/61]
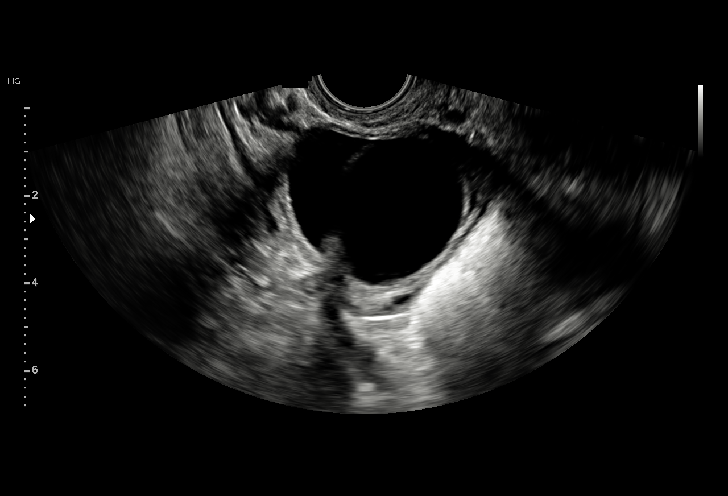
[im 56/61]
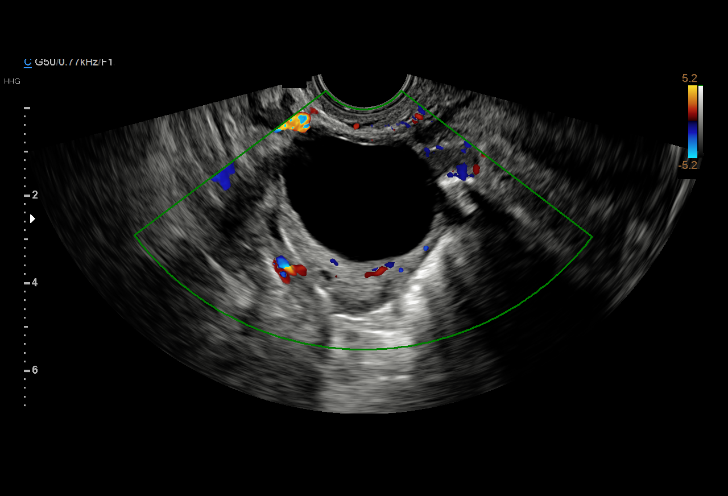
[im 61/61]
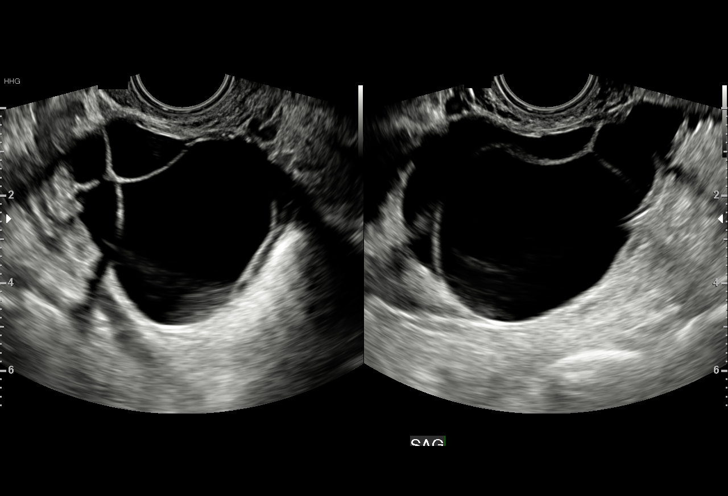

[15 of 28 positions shown; findings below may reference images not displayed]

1  [facility]           [PHONE_NUMBER]      [PHONE_NUMBER]     [PHONE_NUMBER]
2  [facility]            [PHONE_NUMBER]      [PHONE_NUMBER]     [PHONE_NUMBER]
Indications

13 weeks gestation of pregnancy
Ovarian cyst complicating pregnancy            [LY]
OB History

Blood Type:            Height:  5'3"   Weight (lb):  254       BMI:
Gravidity:    3         Term:   2        Prem:   0        SAB:   0
TOP:          0       Ectopic:  0        Living: 2
Fetal Evaluation

Num Of Fetuses:     1
Fetal Heart         167
Rate(bpm):
Cardiac Activity:   Observed
Placenta:           Anterior, above cervical os

Amniotic Fluid
AFI FV:      Subjectively within normal limits
Biometry

BPD:      25.9  mm     G. Age:  14w 3d
Gestational Age

LMP:           14w 3d        Date:  [DATE]                 EDD:   [DATE]
U/S Today:     14w 3d                                        EDD:   [DATE]
Best:          13w 6d     Det. By:  Early Ultrasound         EDD:   [DATE]
([DATE])
Anatomy

Cranium:               Appears normal         Stomach:                Appears normal, left
sided
Choroid Plexus:        Appears normal
Cervix Uterus Adnexa

Uterus
Single fibroid noted, see table below.

Left Ovary
Size(cm)       3.1  x   2.44   x  1.61      Vol(ml):

Right Ovary
Size(cm)       7.5  x   4.8    x  5.3       Vol(ml):
Multiple cysts, see below.

Comment:      Rt ovary mostly cystic with septations
Myomas

Site                     L(cm)      W(cm)      D(cm)      Location
Posterior

Blood Flow                 RI        PI       Comments

Impression

SIUP at 13+6 weeks
No gross abnormalities identified
Normal amniotic fluid volume
Measurements consistent with early US
EV views of cervix and adnexa: normal length without
funneling
Left ovary: normal
Right ovary: cyst measuring 5.3 x 7.1 x 4.8 cms with multiple
sepations; only one small area of blood flow seen within the
septations; no nodular densities identified today
Recommendations

Per GYN ONC note, pt to be referred back for surgical
removal
Follow-up ultrasound in 4-5 weeks for detailed anatomic
survey

## 2015-12-26 ENCOUNTER — Other Ambulatory Visit: Payer: Self-pay | Admitting: Obstetrics and Gynecology

## 2015-12-26 DIAGNOSIS — O3482 Maternal care for other abnormalities of pelvic organs, second trimester: Secondary | ICD-10-CM

## 2015-12-26 DIAGNOSIS — N83209 Unspecified ovarian cyst, unspecified side: Secondary | ICD-10-CM

## 2015-12-26 DIAGNOSIS — Z3A13 13 weeks gestation of pregnancy: Secondary | ICD-10-CM

## 2015-12-26 DIAGNOSIS — Z3A14 14 weeks gestation of pregnancy: Secondary | ICD-10-CM

## 2015-12-27 ENCOUNTER — Other Ambulatory Visit (HOSPITAL_COMMUNITY): Payer: Self-pay | Admitting: *Deleted

## 2015-12-27 DIAGNOSIS — O09529 Supervision of elderly multigravida, unspecified trimester: Secondary | ICD-10-CM

## 2015-12-28 ENCOUNTER — Encounter: Payer: Self-pay | Admitting: Gynecologic Oncology

## 2015-12-28 ENCOUNTER — Ambulatory Visit: Payer: Medicaid Other | Attending: Gynecologic Oncology | Admitting: Gynecologic Oncology

## 2015-12-28 VITALS — BP 118/55 | HR 100 | Temp 97.9°F | Resp 18 | Ht 63.39 in | Wt 254.3 lb

## 2015-12-28 DIAGNOSIS — Z7982 Long term (current) use of aspirin: Secondary | ICD-10-CM | POA: Diagnosis not present

## 2015-12-28 DIAGNOSIS — Z87891 Personal history of nicotine dependence: Secondary | ICD-10-CM | POA: Diagnosis not present

## 2015-12-28 DIAGNOSIS — N83201 Unspecified ovarian cyst, right side: Secondary | ICD-10-CM

## 2015-12-28 DIAGNOSIS — Z3A01 Less than 8 weeks gestation of pregnancy: Secondary | ICD-10-CM | POA: Insufficient documentation

## 2015-12-28 DIAGNOSIS — O3481 Maternal care for other abnormalities of pelvic organs, first trimester: Secondary | ICD-10-CM | POA: Insufficient documentation

## 2015-12-28 LAB — MATERNIT 21 PLUS CORE, BLOOD: PDF: 0

## 2015-12-28 NOTE — Patient Instructions (Signed)
Please call us once you discuss things with your husband.

## 2015-12-28 NOTE — Progress Notes (Signed)
Consult Note: Gyn-Onc   Nichole Green 35 y.o. female  Chief Complaint  Patient presents with  . Right ovary mass "persistant"    Follow up requested per Dr Mora Bellman    Assessment Complex cystic lesion of the right ovary at 7 weeks of pregnancy. 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 The volume of the mass in August was 215 and currently at 180. The nodules that were visualized previously have now receded.  Plan: I discussed with the patient that my suspicion that this represents a malignancy is low at the mass is actually gotten slightly smaller over time and the nodules have resolved. That being said, we cannot completely exclude the possibility of this being a low malignant potential tumor. I do not believe are dealing with a frank carcinoma. She and I again reviewed the risks of removal including pregnancy loss. I believe that the risks of anesthesia at 14-18 weeks are fairly low. She asked what would happen if she decided to not proceed with intervention at this time and weight after her pregnancy. Discuss with her the risks could be an undiagnosed malignancy, portion, rupture. If she chose not to proceed with surgery during the pregnancy should she have a cesarean section and can be addressed at that time or if she has a spontaneous vaginal delivery close follow-up. She understands that 14-18 weeks is the ideal time therefore we will want to schedule something in the next few weeks.   HPI: 36 year old seen in consultation request of Dr. Silas Sacramento regarding management of a complex cystic lesion of the right adnexa discovered at 6 weeks of pregnancy.   She is a gravida 3 para 2 who initially had an ultrasound on 11/03/2015. She was seen by Dr. Fermin Schwab secondary to complex cystic mass noted within the right adnexa which measured 8 x 4.8 x 5.6 cm. It showed multiple  thin internal septations as well as several small mural nodular densities that was suspicious for cystic ovarian neoplasm. She did not have any other imaging and it was recommended that she have a repeat ultrasound. Should a repeat ultrasound on October 2. It shows that the mass is somewhat smaller measuring 5.3 x 7.1 x 54.8 cm with multiple septations. There is only one small area of blood flow seen within the septation and there were no nodular densities identified.  She's overall doing quite well. She has a son who is 38 and a daughter who is 8. She did complain of some abdominal cramping a few weeks ago. She did feel one episode of fetal movement. She endorses vaginal bleeding 2 weeks ago but that has stopped. At that time she had right flank and abdominal pain.  No Known Allergies  Past Medical History:  Diagnosis Date  . Hypertension   . Infection    UTI  . Pregnancy induced hypertension     Past Surgical History:  Procedure Laterality Date  . BREAST REDUCTION SURGERY      Current Outpatient Prescriptions  Medication Sig Dispense Refill  . acetaminophen (TYLENOL) 325 MG tablet Take 650 mg by mouth every 6 (six) hours as needed for mild pain or headache.    Marland Kitchen aspirin EC 81 MG tablet Take 1 tablet (81 mg total) by mouth daily. 30 tablet 6  . benzonatate (TESSALON) 100 MG capsule Take 1 capsule (100 mg total) by mouth every 8 (eight) hours. 21 capsule  0  . labetalol (NORMODYNE) 100 MG tablet Take 1 tablet (100 mg total) by mouth 2 (two) times daily. 60 tablet 2  . Prenatal Vit-Fe Fumarate-FA (PRENATAL MULTIVITAMIN) TABS tablet Take 1 tablet by mouth daily at 12 noon. 30 tablet 12  . fluticasone (FLONASE) 50 MCG/ACT nasal spray Place 2 sprays into both nostrils daily. (Patient not taking: Reported on 12/28/2015) 16 g 0  . promethazine (PHENERGAN) 12.5 MG tablet Take 1 tablet (12.5 mg total) by mouth every 6 (six) hours as needed for nausea or vomiting. (Patient not taking: Reported on  12/28/2015) 30 tablet 0   No current facility-administered medications for this visit.     Social History   Social History  . Marital status: Married    Spouse name: N/A  . Number of children: N/A  . Years of education: N/A   Occupational History  . Not on file.   Social History Main Topics  . Smoking status: Former Smoker    Years: 2.00    Types: Cigarettes    Quit date: 10/25/2015  . Smokeless tobacco: Never Used     Comment: hookah  . Alcohol use No  . Drug use: No  . Sexual activity: Yes    Birth control/ protection: None   Other Topics Concern  . Not on file   Social History Narrative  . No narrative on file    Family History  Problem Relation Age of Onset  . Hypertension Mother   . Diabetes Mother     Vitals: Blood pressure (!) 118/55, pulse 100, temperature 97.9 F (36.6 C), temperature source Oral, resp. rate 18, height 5' 3.39" (1.61 m), weight 254 lb 4.8 oz (115.3 kg), last menstrual period 09/13/2015, SpO2 99 %.  Well-nourished well-developed female in no acute distress.  Nichole Green A., MD 12/28/2015, 12:04 PM

## 2015-12-29 ENCOUNTER — Telehealth: Payer: Self-pay

## 2015-12-29 NOTE — Telephone Encounter (Signed)
Orders received from [facility] to contact the patient to confirm that the she is at 15 weeks of gestation and that per Dr Nancy Marus the "risk" of anesthesia is fairly low during her gestation . Also the patient need to "stop" taking her "aspirin". The Patient was contacted and updated with the date of her surgery and was instructed to stop taking her Aspirin. Patient states she is in her 73 th week of gestation and will stop taking her aspirin. Patient states understanding ,denies further questions at this time. Will place orders for : robotic assisted ovarian cystectomy on October 24 , 2017 with Dr Everitt Amber.

## 2015-12-30 ENCOUNTER — Telehealth: Payer: Self-pay

## 2016-01-03 ENCOUNTER — Ambulatory Visit (INDEPENDENT_AMBULATORY_CARE_PROVIDER_SITE_OTHER): Payer: Medicaid Other | Admitting: Obstetrics & Gynecology

## 2016-01-03 VITALS — BP 118/75 | HR 96 | Temp 98.6°F | Wt 252.8 lb

## 2016-01-03 DIAGNOSIS — O10912 Unspecified pre-existing hypertension complicating pregnancy, second trimester: Secondary | ICD-10-CM

## 2016-01-03 DIAGNOSIS — O10919 Unspecified pre-existing hypertension complicating pregnancy, unspecified trimester: Secondary | ICD-10-CM

## 2016-01-03 DIAGNOSIS — Z3402 Encounter for supervision of normal first pregnancy, second trimester: Secondary | ICD-10-CM

## 2016-01-03 DIAGNOSIS — O09522 Supervision of elderly multigravida, second trimester: Secondary | ICD-10-CM

## 2016-01-03 DIAGNOSIS — N9489 Other specified conditions associated with female genital organs and menstrual cycle: Secondary | ICD-10-CM

## 2016-01-03 NOTE — Patient Instructions (Signed)

## 2016-01-03 NOTE — Progress Notes (Signed)
Scheduled for L/S cystectomy in 2 weeks    PRENATAL VISIT NOTE  Subjective:  Nichole Green is a 35 y.o. G3P2002 at [redacted]w[redacted]d being seen today for ongoing prenatal care.  She is currently monitored for the following issues for this high-risk pregnancy and has AMA (advanced maternal age) multigravida 64+; Chronic hypertension during pregnancy, antepartum; Adnexal mass; Supervision of normal pregnancy; Chronic hypertension in pregnancy; and Obesity affecting pregnancy, antepartum on her problem list.  Patient reports no complaints.  Contractions: Not present. Vag. Bleeding: Scant.  Movement: Present. Denies leaking of fluid.   The following portions of the patient's history were reviewed and updated as appropriate: allergies, current medications, past family history, past medical history, past social history, past surgical history and problem list. Problem list updated.  Objective:   Vitals:   01/03/16 0854  BP: 118/75  Pulse: 96  Temp: 98.6 F (37 C)  Weight: 252 lb 12.8 oz (114.7 kg)    Fetal Status:     Movement: Present     General:  Alert, oriented and cooperative. Patient is in no acute distress.  Skin: Skin is warm and dry. No rash noted.   Cardiovascular: Normal heart rate noted  Respiratory: Normal respiratory effort, no problems with respiration noted  Abdomen: Soft, gravid, appropriate for gestational age. Pain/Pressure: Absent     Pelvic:  Cervical exam deferred        Extremities: Normal range of motion.  Edema: Trace  Mental Status: Normal mood and affect. Normal behavior. Normal judgment and thought content.   Urinalysis:      Assessment and Plan:  Pregnancy: G3P2002 at [redacted]w[redacted]d  1. Encounter for supervision of normal first pregnancy in second trimester  - AFP, Quad Screen - MaterniT21 PLUS Core - [place] MFM OB COMP + 14 WK; Future  2. Chronic hypertension during pregnancy, antepartum Continue Labetalol, will restart ASA after surgery  3. Adnexal mass Surgery 10/24  Dr Alycia Rossetti  Preterm labsurgery schedueor symptoms and general obstetric precautions including but not limited to vaginal bleeding, contractions, leaking of fluid and fetal movement were reviewed in detail with the patient. Please refer to After Visit Summary for other counseling recommendations.  Return in about 4 weeks (around 01/31/2016).  Woodroe Mode, MD

## 2016-01-03 NOTE — Progress Notes (Signed)
Patient states that 3 days ago she had bright red bleeding and cramping but she reports that it is due to her ovarian cyst. Patient states that she has scant bleeding today and has begun to feel flutter fetal movements. Patient denies contractions and pressure.

## 2016-01-04 ENCOUNTER — Other Ambulatory Visit: Payer: Self-pay | Admitting: Obstetrics & Gynecology

## 2016-01-05 LAB — AFP, QUAD SCREEN
DIA Mom Value: 1.55
DIA VALUE (EIA): 219.51 pg/mL
DSR (By Age)    1 IN: 250
DSR (Second Trimester) 1 IN: 743
Gestational Age: 15.4 WEEKS
MSAFP Mom: 1.25
MSAFP: 27.4 ng/mL
MSHCG MOM: 1.1
MSHCG: 37032 m[IU]/mL
Maternal Age At EDD: 35.8 YEARS
Osb Risk: 5576
TEST RESULTS AFP: NEGATIVE
UE3 MOM: 0.95
Weight: 252 [lb_av]
uE3 Value: 0.56 ng/mL

## 2016-01-06 NOTE — Telephone Encounter (Signed)
Pt needed Maternity 21 repeat, done in office on 01-03-16.

## 2016-01-12 ENCOUNTER — Other Ambulatory Visit (HOSPITAL_COMMUNITY): Payer: Medicaid Other

## 2016-01-12 NOTE — Patient Instructions (Addendum)
Nichole Green  01/12/2016   Your procedure is scheduled on: 01/17/2016    Report to [facility] Main  Entrance take [place]  elevators to 3rd floor to  [place] at   [place] AM.  Call this number if you have problems the morning of surgery [phone]   Remember: ONLY 1 PERSON MAY GO WITH YOU TO SHORT STAY TO GET  READY MORNING OF [place].  Do not eat food or drink liquids :After Midnight.             Eat a light diet the day before surgery.  Examples include: soups, broths, toast, yogurt and mashed potatoes. Things to avoid include : carbonated beverages, raw fruits and vegetables and beans.      Take these medicines the morning of surgery with A SIP OF WATER:                                 You may not have any metal on your body including hair pins and              piercings  Do not wear jewelry, make-up, lotions, powders or perfumes, deodorant             Do not wear nail polish.  Do not shave  48 hours prior to surgery.              Do not bring valuables to the hospital. [place].  Contacts, dentures or bridgework may not be worn into surgery.  Leave suitcase in the car. After surgery it may be brought to your room.        Special Instructions: coughing and deep breathing exercises, leg exercises               Please read over the following fact sheets you were given: _____________________________________________________________________             [facility] - Preparing for Surgery Before surgery, you can play an important role.  Because skin is not sterile, your skin needs to be as free of germs as possible.  You can reduce the number of germs on your skin by washing with CHG (chlorahexidine gluconate) soap before surgery.  CHG is an antiseptic cleaner which kills germs and bonds with the skin to continue killing germs even after washing. Please DO NOT use if you have an allergy to CHG  or antibacterial soaps.  If your skin becomes reddened/irritated stop using the CHG and inform your nurse when you arrive at Short Stay. Do not shave (including legs and underarms) for at least 48 hours prior to the first CHG shower.  You may shave your face/neck. Please follow these instructions carefully:  1.  Shower with CHG Soap the night before surgery and the  morning of Surgery.  2.  If you choose to wash your hair, wash your hair first as usual with your  normal  shampoo.  3.  After you shampoo, rinse your hair and body thoroughly to remove the  shampoo.                           4.  Use CHG as you would  any other liquid soap.  You can apply chg directly  to the skin and wash                       Gently with a scrungie or clean washcloth.  5.  Apply the CHG Soap to your body ONLY FROM THE NECK DOWN.   Do not use on face/ open                           Wound or open sores. Avoid contact with eyes, ears mouth and genitals (private parts).                       Wash face,  Genitals (private parts) with your normal soap.             6.  Wash thoroughly, paying special attention to the area where your surgery  will be performed.  7.  Thoroughly rinse your body with warm water from the neck down.  8.  DO NOT shower/wash with your normal soap after using and rinsing off  the CHG Soap.                9.  Pat yourself dry with a clean towel.            10.  Wear clean pajamas.            11.  Place clean sheets on your bed the night of your first shower and do not  sleep with pets. Day of Surgery : Do not apply any lotions/deodorants the morning of surgery.  Please wear clean clothes to the hospital/surgery center.  FAILURE TO FOLLOW THESE INSTRUCTIONS MAY RESULT IN THE CANCELLATION OF YOUR SURGERY PATIENT SIGNATURE_________________________________  NURSE SIGNATURE__________________________________  ________________________________________________________________________  WHAT IS A BLOOD  TRANSFUSION? Blood Transfusion Information  A transfusion is the replacement of blood or some of its parts. Blood is made up of multiple cells which provide different functions.  Red blood cells carry oxygen and are used for blood loss replacement.  White blood cells fight against infection.  Platelets control bleeding.  Plasma helps clot blood.  Other blood products are available for specialized needs, such as hemophilia or other clotting disorders. BEFORE THE TRANSFUSION  Who gives blood for transfusions?   Healthy volunteers who are fully evaluated to make sure their blood is safe. This is blood bank blood. Transfusion therapy is the safest it has ever been in the practice of medicine. Before blood is taken from a donor, a complete history is taken to make sure that person has no history of diseases nor engages in risky social behavior (examples are intravenous drug use or sexual activity with multiple partners). The donor's travel history is screened to minimize risk of transmitting infections, such as malaria. The donated blood is tested for signs of infectious diseases, such as HIV and hepatitis. The blood is then tested to be sure it is compatible with you in order to minimize the chance of a transfusion reaction. If you or a relative donates blood, this is often done in anticipation of surgery and is not appropriate for emergency situations. It takes many days to process the donated blood. RISKS AND COMPLICATIONS Although transfusion therapy is very safe and saves many lives, the main dangers of transfusion include:   Getting an infectious disease.  Developing a transfusion reaction. This is an allergic reaction to something in  the blood you were given. Every precaution is taken to prevent this. The decision to have a blood transfusion has been considered carefully by your caregiver before blood is given. Blood is not given unless the benefits outweigh the risks. AFTER THE  TRANSFUSION  Right after receiving a blood transfusion, you will usually feel much better and more energetic. This is especially true if your red blood cells have gotten low (anemic). The transfusion raises the level of the red blood cells which carry oxygen, and this usually causes an energy increase.  The nurse administering the transfusion will monitor you carefully for complications. HOME CARE INSTRUCTIONS  No special instructions are needed after a transfusion. You may find your energy is better. Speak with your caregiver about any limitations on activity for underlying diseases you may have. SEEK MEDICAL CARE IF:   Your condition is not improving after your transfusion.  You develop redness or irritation at the intravenous (IV) site. SEEK IMMEDIATE MEDICAL CARE IF:  Any of the following symptoms occur over the next 12 hours:  Shaking chills.  You have a temperature by mouth above 102 F (38.9 C), not controlled by medicine.  Chest, back, or muscle pain.  People around you feel you are not acting correctly or are confused.  Shortness of breath or difficulty breathing.  Dizziness and fainting.  You get a rash or develop hives.  You have a decrease in urine output.  Your urine turns a dark color or changes to pink, red, or brown. Any of the following symptoms occur over the next 10 days:  You have a temperature by mouth above 102 F (38.9 C), not controlled by medicine.  Shortness of breath.  Weakness after normal activity.  The white part of the eye turns yellow (jaundice).  You have a decrease in the amount of urine or are urinating less often.  Your urine turns a dark color or changes to pink, red, or brown. Document Released: 03/09/2000 Document Revised: 06/04/2011 Document Reviewed: 10/27/2007 ExitCare Patient Information 2014 [place].  _______________________________________________________________________  Incentive Spirometer  An incentive  spirometer is a tool that can help keep your lungs clear and active. This tool measures how well you are filling your lungs with each breath. Taking long deep breaths may help reverse or decrease the chance of developing breathing (pulmonary) problems (especially infection) following:  A long period of time when you are unable to move or be active. BEFORE THE PROCEDURE   If the spirometer includes an indicator to show your best effort, your nurse or respiratory therapist will set it to a desired goal.  If possible, sit up straight or lean slightly forward. Try not to slouch.  Hold the incentive spirometer in an upright position. INSTRUCTIONS FOR USE  1. Sit on the edge of your bed if possible, or sit up as far as you can in bed or on a chair. 2. Hold the incentive spirometer in an upright position. 3. Breathe out normally. 4. Place the mouthpiece in your mouth and seal your lips tightly around it. 5. Breathe in slowly and as deeply as possible, raising the piston or the ball toward the top of the column. 6. Hold your breath for 3-5 seconds or for as long as possible. Allow the piston or ball to fall to the bottom of the column. 7. Remove the mouthpiece from your mouth and breathe out normally. 8. Rest for a few seconds and repeat Steps 1 through 7 at least 10 times every  1-2 hours when you are awake. Take your time and take a few normal breaths between deep breaths. 9. The spirometer may include an indicator to show your best effort. Use the indicator as a goal to work toward during each repetition. 10. After each set of 10 deep breaths, practice coughing to be sure your lungs are clear. If you have an incision (the cut made at the time of surgery), support your incision when coughing by placing a pillow or rolled up towels firmly against it. Once you are able to get out of bed, walk around indoors and cough well. You may stop using the incentive spirometer when instructed by your caregiver.   RISKS AND COMPLICATIONS  Take your time so you do not get dizzy or light-headed.  If you are in pain, you may need to take or ask for pain medication before doing incentive spirometry. It is harder to take a deep breath if you are having pain. AFTER USE  Rest and breathe slowly and easily.  It can be helpful to keep track of a log of your progress. Your caregiver can provide you with a simple table to help with this. If you are using the spirometer at home, follow these instructions: [place] IF:   You are having difficultly using the spirometer.  You have trouble using the spirometer as often as instructed.  Your pain medication is not giving enough relief while using the spirometer.  You develop fever of 100.5 F (38.1 C) or higher. SEEK IMMEDIATE MEDICAL CARE IF:   You cough up bloody sputum that had not been present before.  You develop fever of 102 F (38.9 C) or greater.  You develop worsening pain at or near the incision site. MAKE SURE YOU:   Understand these instructions.  Will watch your condition.  Will get help right away if you are not doing well or get worse. Document Released: 07/23/2006 Document Revised: 06/04/2011 Document Reviewed: 09/23/2006 [facility] Patient Information 2014 [place], [place].   ________________________________________________________________________

## 2016-01-13 ENCOUNTER — Encounter (HOSPITAL_COMMUNITY)
Admission: RE | Admit: 2016-01-13 | Discharge: 2016-01-13 | Disposition: A | Discharge status: Home / Self Care | Payer: Medicaid Other | Source: Ambulatory Visit | Attending: Gynecologic Oncology | Admitting: Gynecologic Oncology

## 2016-01-13 ENCOUNTER — Encounter (INDEPENDENT_AMBULATORY_CARE_PROVIDER_SITE_OTHER): Payer: Self-pay

## 2016-01-13 ENCOUNTER — Encounter (HOSPITAL_COMMUNITY): Payer: Self-pay

## 2016-01-13 DIAGNOSIS — Z01810 Encounter for preprocedural cardiovascular examination: Secondary | ICD-10-CM | POA: Insufficient documentation

## 2016-01-13 DIAGNOSIS — N839 Noninflammatory disorder of ovary, fallopian tube and broad ligament, unspecified: Secondary | ICD-10-CM | POA: Insufficient documentation

## 2016-01-13 DIAGNOSIS — Z01812 Encounter for preprocedural laboratory examination: Secondary | ICD-10-CM | POA: Diagnosis not present

## 2016-01-13 HISTORY — DX: Headache: R51

## 2016-01-13 HISTORY — DX: Headache, unspecified: R51.9

## 2016-01-13 LAB — CBC WITH DIFFERENTIAL/PLATELET
Basophils Absolute: 0 10*3/uL (ref 0.0–0.1)
Basophils Relative: 0 %
Eosinophils Absolute: 0.2 10*3/uL (ref 0.0–0.7)
Eosinophils Relative: 1 %
HEMATOCRIT: 35.3 % — AB (ref 36.0–46.0)
Hemoglobin: 11.7 g/dL — ABNORMAL LOW (ref 12.0–15.0)
LYMPHS PCT: 28 %
Lymphs Abs: 3.5 10*3/uL (ref 0.7–4.0)
MCH: 24.6 pg — ABNORMAL LOW (ref 26.0–34.0)
MCHC: 33.1 g/dL (ref 30.0–36.0)
MCV: 74.3 fL — AB (ref 78.0–100.0)
MONO ABS: 0.7 10*3/uL (ref 0.1–1.0)
MONOS PCT: 6 %
NEUTROS ABS: 8.1 10*3/uL — AB (ref 1.7–7.7)
Neutrophils Relative %: 65 %
Platelets: 239 10*3/uL (ref 150–400)
RBC: 4.75 MIL/uL (ref 3.87–5.11)
RDW: 15.6 % — AB (ref 11.5–15.5)
WBC: 12.5 10*3/uL — ABNORMAL HIGH (ref 4.0–10.5)

## 2016-01-13 LAB — COMPREHENSIVE METABOLIC PANEL
ALT: 14 U/L (ref 14–54)
ANION GAP: 10 (ref 5–15)
AST: 23 U/L (ref 15–41)
Albumin: 3.2 g/dL — ABNORMAL LOW (ref 3.5–5.0)
Alkaline Phosphatase: 67 U/L (ref 38–126)
BILIRUBIN TOTAL: 0.5 mg/dL (ref 0.3–1.2)
BUN: 7 mg/dL (ref 6–20)
CO2: 20 mmol/L — ABNORMAL LOW (ref 22–32)
Calcium: 9.4 mg/dL (ref 8.9–10.3)
Chloride: 105 mmol/L (ref 101–111)
Creatinine, Ser: 0.44 mg/dL (ref 0.44–1.00)
Glucose, Bld: 102 mg/dL — ABNORMAL HIGH (ref 65–99)
POTASSIUM: 4.1 mmol/L (ref 3.5–5.1)
Sodium: 135 mmol/L (ref 135–145)
TOTAL PROTEIN: 6.9 g/dL (ref 6.5–8.1)

## 2016-01-13 NOTE — Progress Notes (Signed)
CBC/DIFF done 01/13/16 faxed via EPIC to Dr Denman George and Joylene John, Np.

## 2016-01-13 NOTE — Progress Notes (Signed)
Notifed Hydrographic surveyor at [facility].  Of pending surgery on 01/17/2016 with date, time and arrival for surgery.  Administrative coordinator stated she would notify Rapid response nurse for OB/GYN.  I informed her that we would also call am of surgery.  Policy on front of chart

## 2016-01-13 NOTE — Progress Notes (Signed)
T-99.1 at preop on 01/13/16 at 0900am.  FYI.  I already faxed to [facility] the CBC/DIFF results.  Thank You.

## 2016-01-14 LAB — MATERNIT 21 PLUS CORE, BLOOD
CHROMOSOME 13: NEGATIVE
CHROMOSOME 18: NEGATIVE
Chromosome 21: NEGATIVE
PDF: 0
Y CHROMOSOME: DETECTED

## 2016-01-16 NOTE — Progress Notes (Signed)
Final 12 lead EKG done 10/20 in EPIC

## 2016-01-16 NOTE — Progress Notes (Signed)
Spoke with Anesthesia ( Dr Marcie Bal) regarding the fact that patient only takes labetalol based on headaches and dizziness for blood pressure.  No new orders given by Dr Marcie Bal.  Called and left patient a voice mail message to inform patient that I had spoken with anesthesia regarding her Labetalol and how she takes her Labetalol and for patient to give me a call back at [phone].

## 2016-01-16 NOTE — Progress Notes (Signed)
Spoke with patient and informed her that I had spoken with anesthesia ( Dr Marcie Bal) regarding how she takes her Labetalol only for headaches and dizziness.    I told patient not to take any medications morning of surgery.  Patient voiced understanding

## 2016-01-17 ENCOUNTER — Encounter (HOSPITAL_COMMUNITY)
Admission: RE | Disposition: A | Discharge status: Home / Self Care | Payer: Self-pay | Source: Ambulatory Visit | Attending: Gynecologic Oncology

## 2016-01-17 ENCOUNTER — Ambulatory Visit (HOSPITAL_COMMUNITY)
Admission: RE | Admit: 2016-01-17 | Discharge: 2016-01-17 | Disposition: A | Discharge status: Home / Self Care | Payer: Medicaid Other | Source: Ambulatory Visit | Attending: Gynecologic Oncology | Admitting: Gynecologic Oncology

## 2016-01-17 ENCOUNTER — Ambulatory Visit (HOSPITAL_COMMUNITY): Payer: Medicaid Other | Admitting: Certified Registered Nurse Anesthetist

## 2016-01-17 ENCOUNTER — Encounter (HOSPITAL_COMMUNITY): Payer: Self-pay | Admitting: *Deleted

## 2016-01-17 DIAGNOSIS — N838 Other noninflammatory disorders of ovary, fallopian tube and broad ligament: Secondary | ICD-10-CM | POA: Diagnosis not present

## 2016-01-17 DIAGNOSIS — Z3A17 17 weeks gestation of pregnancy: Secondary | ICD-10-CM | POA: Diagnosis not present

## 2016-01-17 DIAGNOSIS — D270 Benign neoplasm of right ovary: Secondary | ICD-10-CM | POA: Diagnosis not present

## 2016-01-17 DIAGNOSIS — Z7982 Long term (current) use of aspirin: Secondary | ICD-10-CM | POA: Insufficient documentation

## 2016-01-17 DIAGNOSIS — O132 Gestational [pregnancy-induced] hypertension without significant proteinuria, second trimester: Secondary | ICD-10-CM | POA: Diagnosis not present

## 2016-01-17 DIAGNOSIS — O3482 Maternal care for other abnormalities of pelvic organs, second trimester: Secondary | ICD-10-CM | POA: Diagnosis not present

## 2016-01-17 DIAGNOSIS — N9489 Other specified conditions associated with female genital organs and menstrual cycle: Secondary | ICD-10-CM | POA: Diagnosis present

## 2016-01-17 DIAGNOSIS — D649 Anemia, unspecified: Secondary | ICD-10-CM | POA: Insufficient documentation

## 2016-01-17 DIAGNOSIS — O99012 Anemia complicating pregnancy, second trimester: Secondary | ICD-10-CM | POA: Diagnosis not present

## 2016-01-17 DIAGNOSIS — O0990 Supervision of high risk pregnancy, unspecified, unspecified trimester: Secondary | ICD-10-CM

## 2016-01-17 DIAGNOSIS — O09522 Supervision of elderly multigravida, second trimester: Secondary | ICD-10-CM | POA: Diagnosis not present

## 2016-01-17 DIAGNOSIS — N83201 Unspecified ovarian cyst, right side: Secondary | ICD-10-CM | POA: Diagnosis not present

## 2016-01-17 DIAGNOSIS — Z87891 Personal history of nicotine dependence: Secondary | ICD-10-CM | POA: Diagnosis not present

## 2016-01-17 HISTORY — PX: ROBOTIC ASSISTED LAPAROSCOPIC OVARIAN CYSTECTOMY: SHX6081

## 2016-01-17 LAB — TYPE AND SCREEN
ABO/RH(D): B POS
Antibody Screen: NEGATIVE

## 2016-01-17 LAB — ABO/RH: ABO/RH(D): B POS

## 2016-01-17 SURGERY — EXCISION, CYST, OVARY, ROBOT-ASSISTED, LAPAROSCOPIC
Anesthesia: General

## 2016-01-17 MED ORDER — DEXAMETHASONE SODIUM PHOSPHATE 10 MG/ML IJ SOLN
INTRAMUSCULAR | Status: AC
  Filled 2016-01-17: qty 1

## 2016-01-17 MED ORDER — PROPOFOL 10 MG/ML IV BOLUS
INTRAVENOUS | Status: AC
  Filled 2016-01-17: qty 20

## 2016-01-17 MED ORDER — ONDANSETRON HCL 4 MG/2ML IJ SOLN
INTRAMUSCULAR | Status: DC | PRN
  Administered 2016-01-17: 4 mg via INTRAVENOUS

## 2016-01-17 MED ORDER — ROCURONIUM BROMIDE 50 MG/5ML IV SOSY
PREFILLED_SYRINGE | INTRAVENOUS | Status: AC
  Filled 2016-01-17: qty 5

## 2016-01-17 MED ORDER — STERILE WATER FOR IRRIGATION IR SOLN
Status: DC | PRN
  Administered 2016-01-17: 1000 mL

## 2016-01-17 MED ORDER — OXYCODONE HCL 5 MG PO TABS: 5.0000 mg | ORAL_TABLET | Freq: Once | ORAL | Status: DC | PRN

## 2016-01-17 MED ORDER — LACTATED RINGERS IV SOLN
INTRAVENOUS | Status: DC | PRN
  Administered 2016-01-17 (×2): via INTRAVENOUS

## 2016-01-17 MED ORDER — FENTANYL CITRATE (PF) 250 MCG/5ML IJ SOLN
INTRAMUSCULAR | Status: DC | PRN
  Administered 2016-01-17: 100 ug via INTRAVENOUS
  Administered 2016-01-17: 50 ug via INTRAVENOUS
  Administered 2016-01-17: 100 ug via INTRAVENOUS

## 2016-01-17 MED ORDER — NEOSTIGMINE METHYLSULFATE 5 MG/5ML IV SOSY
PREFILLED_SYRINGE | INTRAVENOUS | Status: AC
  Filled 2016-01-17: qty 5

## 2016-01-17 MED ORDER — LIDOCAINE 2% (20 MG/ML) 5 ML SYRINGE
INTRAMUSCULAR | Status: AC
  Filled 2016-01-17: qty 5

## 2016-01-17 MED ORDER — GLYCOPYRROLATE 0.2 MG/ML IV SOSY
PREFILLED_SYRINGE | INTRAVENOUS | Status: AC
  Filled 2016-01-17: qty 3

## 2016-01-17 MED ORDER — NEOSTIGMINE METHYLSULFATE 10 MG/10ML IV SOLN
INTRAVENOUS | Status: DC | PRN
  Administered 2016-01-17: 4 mg via INTRAVENOUS

## 2016-01-17 MED ORDER — FENTANYL CITRATE (PF) 250 MCG/5ML IJ SOLN
INTRAMUSCULAR | Status: AC
Start: 2016-01-17 — End: 2016-01-17
  Filled 2016-01-17: qty 5

## 2016-01-17 MED ORDER — FENTANYL CITRATE (PF) 100 MCG/2ML IJ SOLN
INTRAMUSCULAR | Status: AC
  Filled 2016-01-17: qty 2

## 2016-01-17 MED ORDER — ACETAMINOPHEN 160 MG/5ML PO SOLN: 325.0000 mg | ORAL | Status: DC | PRN

## 2016-01-17 MED ORDER — LACTATED RINGERS IR SOLN
Status: DC | PRN
  Administered 2016-01-17: 1000 mL

## 2016-01-17 MED ORDER — ACETAMINOPHEN 325 MG PO TABS: 325.0000 mg | ORAL_TABLET | ORAL | Status: DC | PRN

## 2016-01-17 MED ORDER — OXYCODONE HCL 5 MG/5ML PO SOLN: 5.0000 mg | Freq: Once | ORAL | Status: DC | PRN

## 2016-01-17 MED ORDER — SUCCINYLCHOLINE CHLORIDE 20 MG/ML IJ SOLN
INTRAMUSCULAR | Status: AC
  Filled 2016-01-17: qty 1

## 2016-01-17 MED ORDER — ROCURONIUM BROMIDE 100 MG/10ML IV SOLN
INTRAVENOUS | Status: DC | PRN
  Administered 2016-01-17: 50 mg via INTRAVENOUS

## 2016-01-17 MED ORDER — OXYCODONE-ACETAMINOPHEN 5-325 MG PO TABS: 1.0000 | ORAL_TABLET | ORAL | 0 refills | Status: DC | PRN

## 2016-01-17 MED ORDER — LIDOCAINE HCL (CARDIAC) 20 MG/ML IV SOLN
INTRAVENOUS | Status: DC | PRN
  Administered 2016-01-17: 80 mg via INTRATRACHEAL

## 2016-01-17 MED ORDER — GLYCOPYRROLATE 0.2 MG/ML IJ SOLN
INTRAMUSCULAR | Status: DC | PRN
  Administered 2016-01-17: 0.6 mg via INTRAVENOUS

## 2016-01-17 MED ORDER — FENTANYL CITRATE (PF) 100 MCG/2ML IJ SOLN
25.0000 ug | INTRAMUSCULAR | Status: DC | PRN
  Administered 2016-01-17: 50 ug via INTRAVENOUS

## 2016-01-17 MED ORDER — ONDANSETRON HCL 4 MG/2ML IJ SOLN
INTRAMUSCULAR | Status: AC
  Filled 2016-01-17: qty 2

## 2016-01-17 MED ORDER — PROPOFOL 10 MG/ML IV BOLUS
INTRAVENOUS | Status: DC | PRN
  Administered 2016-01-17: 200 mg via INTRAVENOUS

## 2016-01-17 MED ORDER — EPHEDRINE SULFATE 50 MG/ML IJ SOLN
INTRAMUSCULAR | Status: DC | PRN
  Administered 2016-01-17 (×3): 10 mg via INTRAVENOUS

## 2016-01-17 MED ORDER — SUCCINYLCHOLINE CHLORIDE 20 MG/ML IJ SOLN
INTRAMUSCULAR | Status: DC | PRN
  Administered 2016-01-17: 140 mg via INTRAVENOUS

## 2016-01-17 MED ORDER — EPHEDRINE 5 MG/ML INJ
INTRAVENOUS | Status: AC
  Filled 2016-01-17: qty 10

## 2016-01-17 MED ORDER — DEXAMETHASONE SODIUM PHOSPHATE 10 MG/ML IJ SOLN
INTRAMUSCULAR | Status: DC | PRN
  Administered 2016-01-17: 10 mg via INTRAVENOUS

## 2016-01-17 SURGICAL SUPPLY — 55 items
BAG LAPAROSCOPIC 12 15 PORT 16 (BASKET) IMPLANT
BAG RETRIEVAL 12/15 (BASKET)
CHLORAPREP W/TINT 26ML (MISCELLANEOUS) ×2 IMPLANT
COVER SURGICAL LIGHT HANDLE (MISCELLANEOUS) ×2 IMPLANT
COVER TIP SHEARS 8 DVNC (MISCELLANEOUS) ×1 IMPLANT
COVER TIP SHEARS 8MM DA VINCI (MISCELLANEOUS) ×1
DERMABOND ADVANCED (GAUZE/BANDAGES/DRESSINGS) ×1
DERMABOND ADVANCED .7 DNX12 (GAUZE/BANDAGES/DRESSINGS) ×1 IMPLANT
DRAPE ARM DVNC X/XI (DISPOSABLE) ×4 IMPLANT
DRAPE COLUMN DVNC XI (DISPOSABLE) ×1 IMPLANT
DRAPE DA VINCI XI ARM (DISPOSABLE) ×4
DRAPE DA VINCI XI COLUMN (DISPOSABLE) ×1
DRAPE SHEET LG 3/4 BI-LAMINATE (DRAPES) ×4 IMPLANT
DRAPE SURG IRRIG POUCH 19X23 (DRAPES) ×2 IMPLANT
ELECT REM PT RETURN 9FT ADLT (ELECTROSURGICAL) ×2
ELECTRODE REM PT RTRN 9FT ADLT (ELECTROSURGICAL) ×1 IMPLANT
GLOVE BIO SURGEON STRL SZ 6 (GLOVE) ×8 IMPLANT
GLOVE BIO SURGEON STRL SZ 6.5 (GLOVE) ×4 IMPLANT
GOWN STRL REUS W/ TWL LRG LVL3 (GOWN DISPOSABLE) ×3 IMPLANT
GOWN STRL REUS W/TWL LRG LVL3 (GOWN DISPOSABLE) ×3
HOLDER FOLEY CATH W/STRAP (MISCELLANEOUS) ×2 IMPLANT
IRRIG SUCT STRYKERFLOW 2 WTIP (MISCELLANEOUS) ×2
IRRIGATION SUCT STRKRFLW 2 WTP (MISCELLANEOUS) ×1 IMPLANT
KIT BASIN OR (CUSTOM PROCEDURE TRAY) ×2 IMPLANT
KIT PROCEDURE DA VINCI SI (MISCELLANEOUS)
KIT PROCEDURE DVNC SI (MISCELLANEOUS) IMPLANT
MANIPULATOR UTERINE 4.5 ZUMI (MISCELLANEOUS) IMPLANT
MARKER SKIN DUAL TIP RULER LAB (MISCELLANEOUS) ×2 IMPLANT
NDL SAFETY ECLIPSE 18X1.5 (NEEDLE) IMPLANT
NEEDLE HYPO 18GX1.5 SHARP (NEEDLE)
NEEDLE SPNL 18GX3.5 QUINCKE PK (NEEDLE) IMPLANT
OBTURATOR XI 8MM BLADELESS (TROCAR) ×2 IMPLANT
OCCLUDER COLPOPNEUMO (BALLOONS) IMPLANT
PAD POSITIONING PINK XL (MISCELLANEOUS) ×2 IMPLANT
PORT ACCESS TROCAR AIRSEAL 12 (TROCAR) ×1 IMPLANT
PORT ACCESS TROCAR AIRSEAL 5M (TROCAR) ×1
POUCH SPECIMEN RETRIEVAL 10MM (ENDOMECHANICALS) ×4 IMPLANT
SEAL CANN UNIV 5-8 DVNC XI (MISCELLANEOUS) ×4 IMPLANT
SEAL XI 5MM-8MM UNIVERSAL (MISCELLANEOUS) ×4
SET TRI-LUMEN FLTR TB AIRSEAL (TUBING) ×2 IMPLANT
SHEET LAVH (DRAPES) ×2 IMPLANT
SOLUTION ELECTROLUBE (MISCELLANEOUS) ×2 IMPLANT
SURGIFLO W/THROMBIN 8M KIT (HEMOSTASIS) ×2 IMPLANT
SUT MNCRL AB 4-0 PS2 18 (SUTURE) ×4 IMPLANT
SUT VIC AB 0 CT1 27 (SUTURE)
SUT VIC AB 0 CT1 27XBRD ANTBC (SUTURE) IMPLANT
SYR 50ML LL SCALE MARK (SYRINGE) ×2 IMPLANT
SYRINGE 10CC LL (SYRINGE) IMPLANT
TOWEL OR 17X26 10 PK STRL BLUE (TOWEL DISPOSABLE) ×4 IMPLANT
TOWEL OR NON WOVEN STRL DISP B (DISPOSABLE) ×2 IMPLANT
TRAP SPECIMEN MUCOUS 40CC (MISCELLANEOUS) IMPLANT
TRAY LAPAROSCOPIC (CUSTOM PROCEDURE TRAY) ×2 IMPLANT
TROCAR BLADELESS OPT 5 100 (ENDOMECHANICALS) ×2 IMPLANT
UNDERPAD 30X30 INCONTINENT (UNDERPADS AND DIAPERS) ×2 IMPLANT
WATER STERILE IRR 1500ML POUR (IV SOLUTION) ×2 IMPLANT

## 2016-01-17 NOTE — Progress Notes (Signed)
Discussion had with patient regarding surgical risks. I discussed the risk of contractions and pregnancy loss. I discussed the risk of uterine perforation by the trochar. I discussed the risk of bleeding from the ovarian bed and the risk for needing oophorectomy.  I offered the patient the alternative to surgery which would be expectant management including a surgical procedure after she delivers (should the cyst remain present at that time). The patient declines the option of expectant management and is electing to proceed with surgery at this time.  Donaciano Eva, MD

## 2016-01-17 NOTE — Progress Notes (Signed)
Juliann Pulse, rn from [facility] hospital is taking fetal heat tones at bedside... FHT IS 160.

## 2016-01-17 NOTE — Anesthesia Preprocedure Evaluation (Signed)
Anesthesia Evaluation  Patient identified by MRN, date of birth, ID band Patient awake    Reviewed: Allergy & Precautions, NPO status , Patient's Chart, lab work & pertinent test results, reviewed documented beta blocker date and time   History of Anesthesia Complications Negative for: history of anesthetic complications  Airway Mallampati: II  TM Distance: >3 FB Neck ROM: Full    Dental  (+) Teeth Intact   Pulmonary neg shortness of breath, neg COPD, former smoker,    breath sounds clear to auscultation       Cardiovascular hypertension, Pt. on medications and Pt. on home beta blockers  Rhythm:Regular     Neuro/Psych  Headaches, negative psych ROS   GI/Hepatic negative GI ROS, Neg liver ROS,   Endo/Other  negative endocrine ROS  Renal/GU negative Renal ROS     Musculoskeletal   Abdominal   Peds  Hematology  (+) anemia ,   Anesthesia Other Findings   Reproductive/Obstetrics (+) Pregnancy                             Anesthesia Physical Anesthesia Plan  ASA: III  Anesthesia Plan: General   Post-op Pain Management:    Induction: Intravenous  Airway Management Planned: Oral ETT  Additional Equipment: None  Intra-op Plan:   Post-operative Plan: Extubation in OR  Informed Consent: I have reviewed the patients History and Physical, chart, labs and discussed the procedure including the risks, benefits and alternatives for the proposed anesthesia with the patient or authorized representative who has indicated his/her understanding and acceptance.   Dental advisory given  Plan Discussed with: CRNA and Surgeon  Anesthesia Plan Comments:         Anesthesia Quick Evaluation

## 2016-01-17 NOTE — Progress Notes (Signed)
Mcneil Sober, R.N.- OB Rapid Response - here- using doppler- fetal heart rate 143

## 2016-01-17 NOTE — H&P (View-Only) (Signed)
Consult Note: Gyn-Onc   Nichole Green 35 y.o. female  Chief Complaint  Patient presents with  . Right ovary mass "persistant"    Follow up requested per Nichole Green    Assessment Complex cystic lesion of the right ovary at 7 weeks of pregnancy. 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 The volume of the mass in August was 215 and currently at 180. The nodules that were visualized previously have now receded.  Plan: I discussed with the patient that my suspicion that this represents a malignancy is low at the mass is actually gotten slightly smaller over time and the nodules have resolved. That being said, we cannot completely exclude the possibility of this being a low malignant potential tumor. I do not believe are dealing with a frank carcinoma. She and I again reviewed the risks of removal including pregnancy loss. I believe that the risks of anesthesia at 14-18 weeks are fairly low. She asked what would happen if she decided to not proceed with intervention at this time and weight after her pregnancy. Discuss with her the risks could be an undiagnosed malignancy, portion, rupture. If she chose not to proceed with surgery during the pregnancy should she have a cesarean section and can be addressed at that time or if she has a spontaneous vaginal delivery close follow-up. She understands that 14-18 weeks is the ideal time therefore we will want to schedule something in the next few weeks.   HPI: 35 year old seen in consultation request of Nichole. Silas Green regarding management of a complex cystic lesion of the right adnexa discovered at 6 weeks of pregnancy.   She is a gravida 3 para 2 who initially had an ultrasound on 11/03/2015. She was seen by Nichole. Fermin Green secondary to complex cystic mass noted within the right adnexa which measured 8 x 4.8 x 5.6 cm. It showed multiple  thin internal septations as well as several small mural nodular densities that was suspicious for cystic ovarian neoplasm. She did not have any other imaging and it was recommended that she have a repeat ultrasound. Should a repeat ultrasound on October 2. It shows that the mass is somewhat smaller measuring 5.3 x 7.1 x 54.8 cm with multiple septations. There is only one small area of blood flow seen within the septation and there were no nodular densities identified.  She's overall doing quite well. She has a son who is 73 and a daughter who is 8. She did complain of some abdominal cramping a few weeks ago. She did feel one episode of fetal movement. She endorses vaginal bleeding 2 weeks ago but that has stopped. At that time she had right flank and abdominal pain.  No Known Allergies  Past Medical History:  Diagnosis Date  . Hypertension   . Infection    UTI  . Pregnancy induced hypertension     Past Surgical History:  Procedure Laterality Date  . BREAST REDUCTION SURGERY      Current Outpatient Prescriptions  Medication Sig Dispense Refill  . acetaminophen (TYLENOL) 325 MG tablet Take 650 mg by mouth every 6 (six) hours as needed for mild pain or headache.    Marland Kitchen aspirin EC 81 MG tablet Take 1 tablet (81 mg total) by mouth daily. 30 tablet 6  . benzonatate (TESSALON) 100 MG capsule Take 1 capsule (100 mg total) by mouth every 8 (eight) hours. 21 capsule  0  . labetalol (NORMODYNE) 100 MG tablet Take 1 tablet (100 mg total) by mouth 2 (two) times daily. 60 tablet 2  . Prenatal Vit-Fe Fumarate-FA (PRENATAL MULTIVITAMIN) TABS tablet Take 1 tablet by mouth daily at 12 noon. 30 tablet 12  . fluticasone (FLONASE) 50 MCG/ACT nasal spray Place 2 sprays into both nostrils daily. (Patient not taking: Reported on 12/28/2015) 16 g 0  . promethazine (PHENERGAN) 12.5 MG tablet Take 1 tablet (12.5 mg total) by mouth every 6 (six) hours as needed for nausea or vomiting. (Patient not taking: Reported on  12/28/2015) 30 tablet 0   No current facility-administered medications for this visit.     Social History   Social History  . Marital status: Married    Spouse name: N/A  . Number of children: N/A  . Years of education: N/A   Occupational History  . Not on file.   Social History Main Topics  . Smoking status: Former Smoker    Years: 2.00    Types: Cigarettes    Quit date: 10/25/2015  . Smokeless tobacco: Never Used     Comment: hookah  . Alcohol use No  . Drug use: No  . Sexual activity: Yes    Birth control/ protection: None   Other Topics Concern  . Not on file   Social History Narrative  . No narrative on file    Family History  Problem Relation Age of Onset  . Hypertension Mother   . Diabetes Mother     Vitals: Blood pressure (!) 118/55, pulse 100, temperature 97.9 F (36.6 C), temperature source Oral, resp. rate 18, height 5' 3.39" (1.61 m), weight 254 lb 4.8 oz (115.3 kg), last menstrual period 09/13/2015, SpO2 99 %.  Well-nourished well-developed female in no acute distress.  Nichole Felch A., MD 12/28/2015, 12:04 PM

## 2016-01-17 NOTE — Interval H&P Note (Signed)
History and Physical Interval Note:  01/17/2016 7:13 AM  Nichole Green  has presented today for surgery, with the diagnosis of RIGHT OVARIAN MASS  The various methods of treatment have been discussed with the patient and family. After consideration of risks, benefits and other options for treatment, the patient has consented to  Procedure(s): XI ROBOTIC [place] (N/A) as a surgical intervention .  The patient's history has been reviewed, patient examined, no change in status, stable for surgery.  I have reviewed the patient's chart and labs.  Questions were answered to the patient's satisfaction.     Donaciano Eva

## 2016-01-17 NOTE — Op Note (Signed)
OPERATIVE NOTE  Date: 01/17/16  Preoperative Diagnosis: 17+ weeks GA, 5+cm right ovarian cyst   Postoperative Diagnosis:  same  Procedure(s) Performed: Robotic-assisted laparoscopic right ovarian cystectomy   Surgeon: Everitt Amber, M.D.  Assistant Surgeon: Lahoma Crocker M.D. (an MD assistant was necessary for tissue manipulation, management of robotic instrumentation, retraction and positioning due to the complexity of the case and hospital policies).   Anesthesia: Gen. endotracheal.  Specimens: washings, right ovarian cyst, right para-tubal cyst, washings   Estimated Blood Loss: <20 mL. Blood Replacement: None  Complications: none  Indication for Procedure:  Patient is [redacted]weeks pregnant with persistent right ovarian complex cyst.  Operative Findings: fetal heart tones confirmed pre and post operative, 6cm right ovary in the posterior cul de sac behind the uterus. 2cm right paratubal cyst. Benign findings on frozen section.   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 She is placed in a dorsolithotomy position and cervical acromial pad was placed. The arms were tucked with care taken to pad the olecranon process. And prepped and draped in usual sterile fashion. A 20mm incision was made in the left upper quadrant palmer's point and a 5 mm Optiview trocar used to enter the abdomen under direct visualization. With entry into the abdomen and then maintenance of 12 mm of mercury the patient was placed in Trendelenburg position with aeroplane to the left. Three incisions were made in the right mid/upper abdomen and midline. 8 mm robotic trochars were inserted. The robot was docked.   The abdomen was inspected as was the pelvis.  Pelvic washings were obtained. The right ovary was wedged posterior to the gravid uterus. Gentle teasing of the right ovarian vessels  was employed to deliver the ovary from behind the uterus to above the pelvic brim. No tension/pressure/manipulation was placed on the uterine fundus during this procedure. An incision was made to score the ovarian cortex. The underlying ovarian cyst was carefully disected from the cortex without rupture. Specimen was placed in an Endo Catch bag. In a similar fashion the right paratubal cyst was excised from the overlying fallopian tube and sent for pathology. Irrigation of the ovarian cortex took place. Judicious use of cautery was employed to seal individual vessels in the cortex.  The robot was undocked.The contents of the Endo Catch bag were first aspirated and then morcellated within the bag to facilitate removal from the abdominal cavity through the LUQ incision.  The ports were all remove. The fascial closure at the umbilical incision and left upper quadrant port was made with 0 Vicryl.  All incisions were closed with a running subcuticular Monocryl suture. Dermabond was applied. Sponge, lap and needle counts were correct x 3.    The patient had sequential compression devices for VTE prophylaxis.         Disposition: PACU         Condition: stable  Donaciano Eva, MD

## 2016-01-17 NOTE — Discharge Instructions (Signed)
General Anesthesia, Adult, Care After Refer to this sheet in the next few weeks. These instructions provide you with information on caring for yourself after your procedure. Your health care provider may also give you more specific instructions. Your treatment has been planned according to current medical practices, but problems sometimes occur. Call your health care provider if you have any problems or questions after your procedure. WHAT TO EXPECT AFTER THE PROCEDURE After the procedure, it is typical to experience:  Sleepiness.  Nausea and vomiting. HOME CARE INSTRUCTIONS  For the first 24 hours after general anesthesia:  Have a responsible person with you.  Do not drive a car. If you are alone, do not take public transportation.  Do not drink alcohol.  Do not take medicine that has not been prescribed by your health care provider.  Do not sign important papers or make important decisions.  You may resume a normal diet and activities as directed by your health care provider.  Change bandages (dressings) as directed.  If you have questions or problems that seem related to general anesthesia, call the hospital and ask for the anesthetist or anesthesiologist on call. SEEK MEDICAL CARE IF:  You have nausea and vomiting that continue the day after anesthesia.  You develop a rash. SEEK IMMEDIATE MEDICAL CARE IF:   You have difficulty breathing.  You have chest pain.  You have any allergic problems.   This information is not intended to replace advice given to you by your health care provider. Make sure you discuss any questions you have with your health care provider.   Document Released: 06/18/2000 Document Revised: 04/02/2014 Document Reviewed: 07/11/2011 Elsevier Interactive Patient Education 2016 [employer]  Ovarian Cystectomy, Care After Refer to this sheet in the next few weeks. These instructions provide you with information on caring for yourself after your  procedure. Your health care provider may also give you more specific instructions. Your treatment has been planned according to current medical practices, but problems sometimes occur. Call your health care provider if you have any problems or questions after your procedure.  WHAT TO EXPECT AFTER THE PROCEDURE After your procedure, it is typical to have the following:  Pain in your abdomen, especially at the incision sites. You will be given pain medicines to control the pain.  Tiredness. This is a normal part of the recovery process. Your energy level will return to normal over the next several weeks.  Constipation. HOME CARE INSTRUCTIONS  Only take over-the-counter or prescription medicines as directed by your health care provider. Avoid taking aspirin because it can cause bleeding.   Follow your health care provider's instructions for when to resume your regular diet, exercise, and activities.   Take rest breaks during the day as needed.  Do not douche or have sexual intercourse until you have permission from your health care provider.   Remove or change any bandages (dressings) as directed by your health care provider.   Do not drive until your health care provider approves.   Take showers instead of baths until your health care provider tells you otherwise.   If you become constipated, you may:   Use a mild laxative if your health care provider approves.  Add more fruit and bran to your diet.  Drink more fluids.  Take your temperature twice a day and record it.   Do not drink alcohol while taking pain medicine.   Try to have someone home with you for the first 1-2 weeks to help  with your household activities.   Follow up with your health care provider as directed. SEEK MEDICAL CARE IF:  You have a fever.   You feel sick to your stomach (nauseous) and throw up (vomit).   You have redness, swelling, or leakage of fluid at the incision site.   You have  pain when you urinate or have blood in your urine.   You have a rash on your body.   You have pain or redness where the IV tube was inserted.   You have pain that is not relieved with medicine.  SEEK IMMEDIATE MEDICAL CARE IF:  You have chest pain or shortness of breath.   You feel dizzy or lightheaded.   You have increasing abdominal pain that is not relieved with medicines.   You have pain, swelling, or redness in your leg.   You see a yellowish white fluid (pus) coming from the incision.   Your incision is opening (edges not staying together).    This information is not intended to replace advice given to you by your health care provider. Make sure you discuss any questions you have with your health care provider.   Document Released: 12/31/2012 Document Reviewed: 12/31/2012 Elsevier Interactive Patient Education [employer].    Return to work: 2 weeks  Activity: 1. Be up and out of the bed during the day.  Take a nap if needed.  You may walk up steps but be careful and use the hand rail.  Stair climbing will tire you more than you think, you may need to stop part way and rest.   2. No lifting or straining for 6 weeks.  3. No driving for 1-2 weeks.  Do Not drive if you are taking narcotic pain medicine.  4. Shower daily.  Use soap and water on your incision and pat dry; don't rub.   5. No sexual activity and nothing in the vagina for 4 weeks.  Diet: 1. Low sodium Heart Healthy Diet is recommended.  2. It is safe to use a laxative if you have difficulty moving your bowels.   Wound Care: 1. Keep clean and dry.  Shower daily.  Reasons to call the Doctor:   Fever - Oral temperature greater than 100.4 degrees Fahrenheit  Foul-smelling vaginal discharge  Difficulty urinating  Nausea and vomiting  Increased pain at the site of the incision that is unrelieved with pain medicine.  Difficulty breathing with or without chest pain  New calf pain  especially if only on one side  Sudden, continuing increased vaginal bleeding with or without clots.   Follow-up: 1. See Everitt Amber in 4 weeks.  Contacts: For questions or concerns you should contact:  Dr. Everitt Amber at [phone]  or at [place]

## 2016-01-17 NOTE — Anesthesia Postprocedure Evaluation (Signed)
Anesthesia Post Note  Patient: Nichole Green  Procedure(s) Performed: Procedure(s) (LRB): XI ROBOTIC ASSISTED LAPAROSCOPIC OVARIAN CYSTECTOMY (N/A)  Patient location during evaluation: PACU Anesthesia Type: General Level of consciousness: awake Pain management: pain level controlled Vital Signs Assessment: post-procedure vital signs reviewed and stable Respiratory status: spontaneous breathing Cardiovascular status: stable Postop Assessment: no signs of nausea or vomiting Comments: FHR 143    Last Vitals:  Vitals:   01/17/16 1041 01/17/16 1124  BP: (!) 111/54 113/72  Pulse: (!) 104 61  Resp: 16 18  Temp: 36.9 C 36.6 C    Last Pain:  Vitals:   01/17/16 1124  TempSrc: Oral  PainSc: 3                  [name]

## 2016-01-17 NOTE — Transfer of Care (Signed)
Immediate Anesthesia Transfer of Care Note  Patient: Nichole Green  Procedure(s) Performed: Procedure(s): XI ROBOTIC ASSISTED LAPAROSCOPIC OVARIAN CYSTECTOMY (N/A)  Patient Location: PACU  Anesthesia Type:General  Level of Consciousness: sedated and responds to stimulation  Airway & Oxygen Therapy: Patient Spontanous Breathing and Patient connected to face mask oxygen  Post-op Assessment: Report given to RN and Post -op Vital signs reviewed and stable  Post vital signs: Reviewed and stable  Last Vitals:  Vitals:   01/17/16 0557  BP: 125/81  Pulse: (!) 103  Resp: 16  Temp: 36.8 C    Last Pain:  Vitals:   01/17/16 0557  TempSrc: Oral      Patients Stated Pain Goal: 4 ([id number])  Complications: No apparent anesthesia complications

## 2016-01-17 NOTE — Progress Notes (Signed)
Dr. Ermalene Postin made aware of patient's heart rates and blood pressure- also that fetal heart rate 143 when checked in PACU- O.K. To go to Short Stay.

## 2016-01-17 NOTE — Anesthesia Procedure Notes (Signed)
Procedure Name: Intubation Date/Time: 01/17/2016 7:39 AM Performed by: [place] Safira Proffit C Pre-anesthesia Checklist: Patient identified, Suction available, Emergency Drugs available, Patient being monitored and Timeout performed Patient Re-evaluated:Patient Re-evaluated prior to inductionOxygen Delivery Method: Circle system utilized Preoxygenation: Pre-oxygenation with 100% oxygen Intubation Type: IV induction, Cricoid Pressure applied and Rapid sequence Laryngoscope Size: Mac and 3 Grade View: Grade I Tube type: Oral Tube size: 7.0 mm Number of attempts: 1 Airway Equipment and Method: Stylet Placement Confirmation: ETT inserted through vocal cords under direct vision,  CO2 detector,  positive ETCO2 and breath sounds checked- equal and bilateral Secured at: 20 cm Tube secured with: Tape Dental Injury: Teeth and Oropharynx as per pre-operative assessment

## 2016-01-18 ENCOUNTER — Telehealth: Payer: Self-pay

## 2016-01-18 LAB — URINE CULTURE

## 2016-01-18 NOTE — Telephone Encounter (Signed)
Orders received from [facility] to contact the patient to update with surgical pathology report : Final pathology report "benign" , "no cancer ". Patient contacted and updated with pathology report , patient states understanding. Patient states postoperatively she is doing well . Patient states her pain is "minimal" , states her appetite is "ok" ,but she is "constipated" . Patient asking is she can take Miralax. Writer instructed the patient to contact her OB/GYN to see if it was safe for the fetus. Patient states understanding and agreed to contact her OB/GYN for recommendations. Patient's Post-op follow up appointment was scheduled with Dr Everitt Amber for Nov 15 , the patient will be at [redacted] weeks gestation at the time of her [post-op follow up. Patient is aware to call with any changes , questions or concerns.

## 2016-01-20 ENCOUNTER — Ambulatory Visit (HOSPITAL_COMMUNITY): Payer: Medicaid Other

## 2016-01-24 ENCOUNTER — Ambulatory Visit (HOSPITAL_COMMUNITY)
Admission: RE | Admit: 2016-01-24 | Discharge: 2016-01-24 | Disposition: A | Discharge status: Home / Self Care | Payer: Medicaid Other | Source: Ambulatory Visit | Attending: Obstetrics and Gynecology | Admitting: Obstetrics and Gynecology

## 2016-01-24 DIAGNOSIS — O09522 Supervision of elderly multigravida, second trimester: Secondary | ICD-10-CM | POA: Diagnosis not present

## 2016-01-24 DIAGNOSIS — O99212 Obesity complicating pregnancy, second trimester: Secondary | ICD-10-CM | POA: Insufficient documentation

## 2016-01-24 DIAGNOSIS — Z3A19 19 weeks gestation of pregnancy: Secondary | ICD-10-CM | POA: Diagnosis not present

## 2016-01-24 DIAGNOSIS — O09529 Supervision of elderly multigravida, unspecified trimester: Secondary | ICD-10-CM

## 2016-01-24 DIAGNOSIS — Z363 Encounter for antenatal screening for malformations: Secondary | ICD-10-CM | POA: Insufficient documentation

## 2016-01-24 DIAGNOSIS — O10012 Pre-existing essential hypertension complicating pregnancy, second trimester: Secondary | ICD-10-CM | POA: Insufficient documentation

## 2016-01-24 IMAGING — US US MFM OB DETAIL+14 WK
1 series · 14 of 28 positions shown · non-contrast
Comparison: none

[Series 1: us mfm ob detail+14 wk · 14 of 51 slices shown]
[im 2/51]
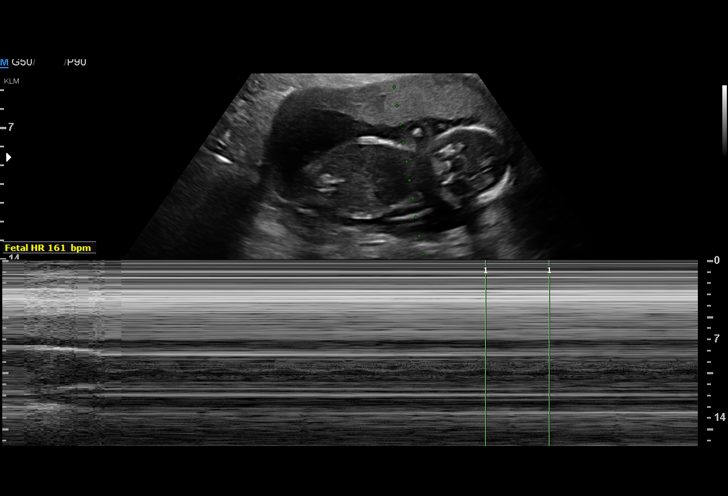
[im 6/51]
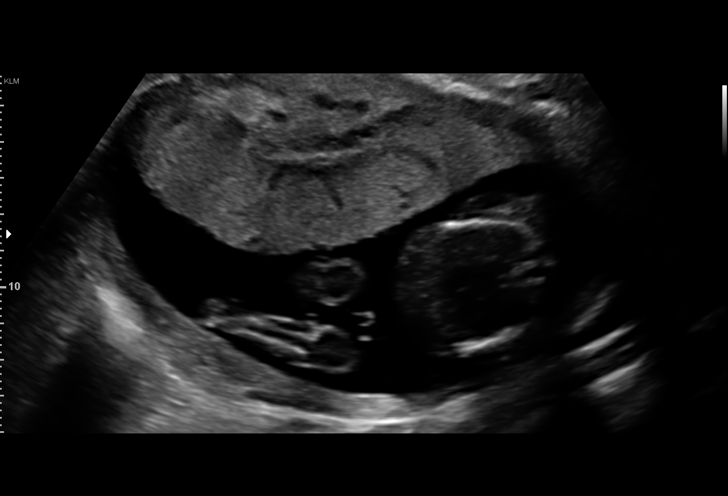
[im 10/51]
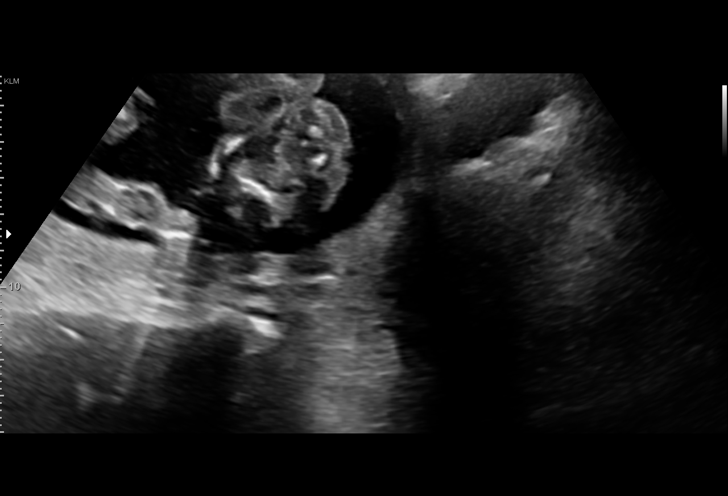
[im 13/51]
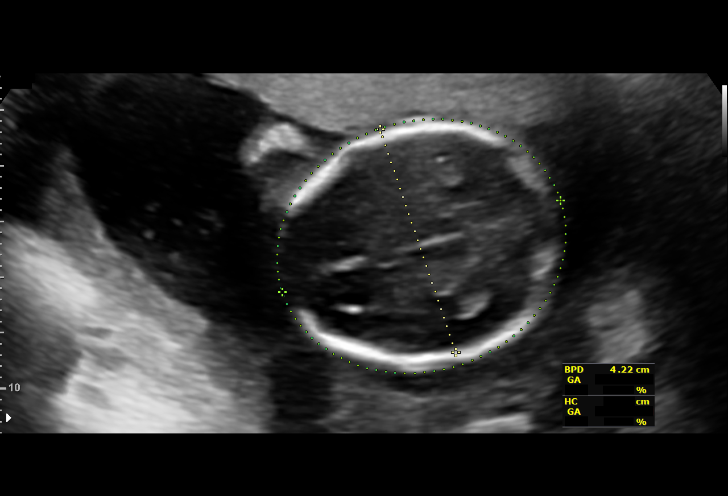
[im 17/51]
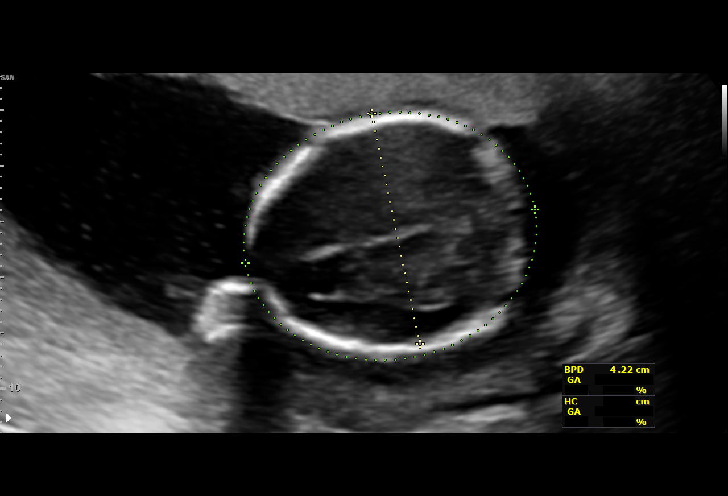
[im 21/51]
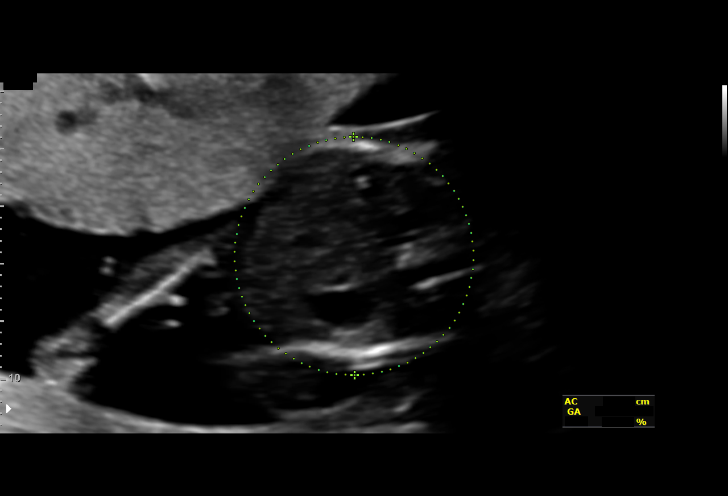
[im 25/51]
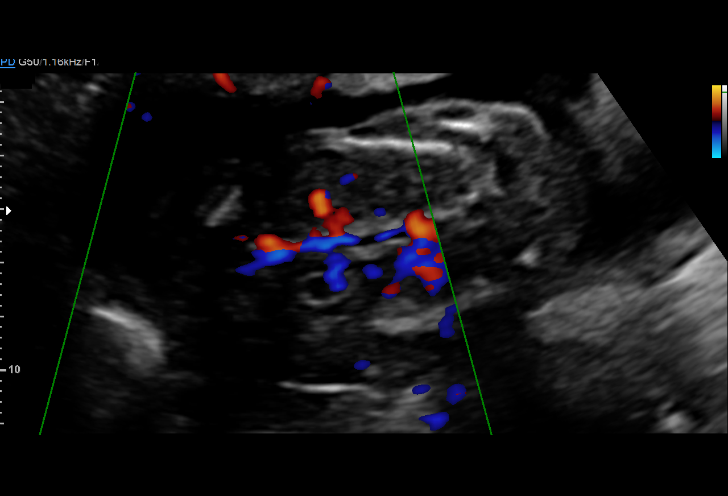
[im 28/51]
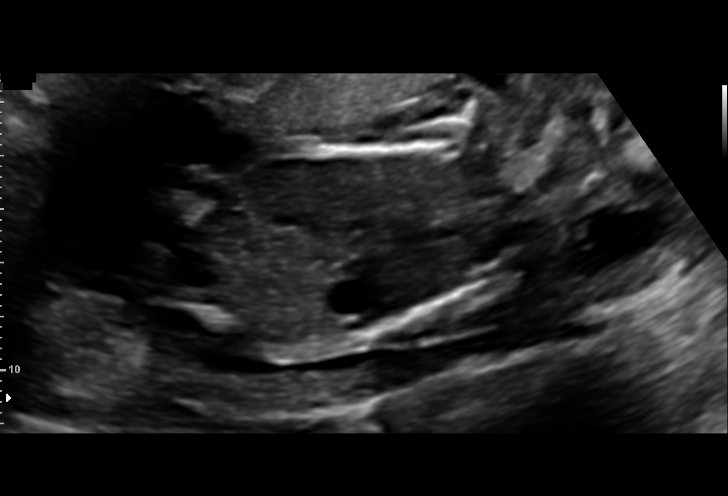
[im 32/51]
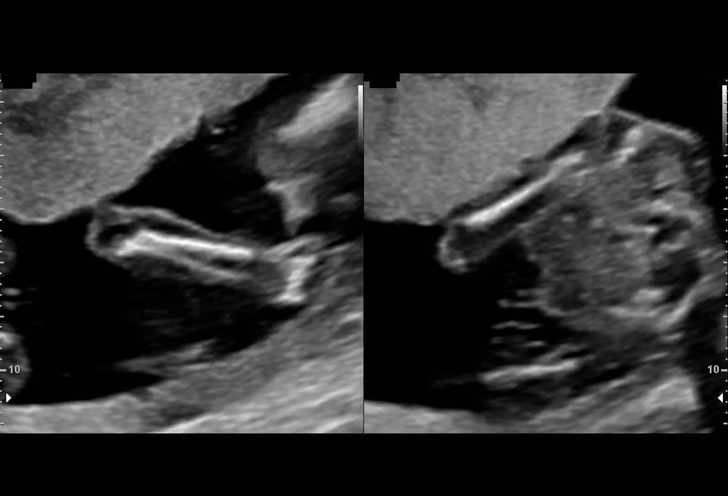
[im 36/51]
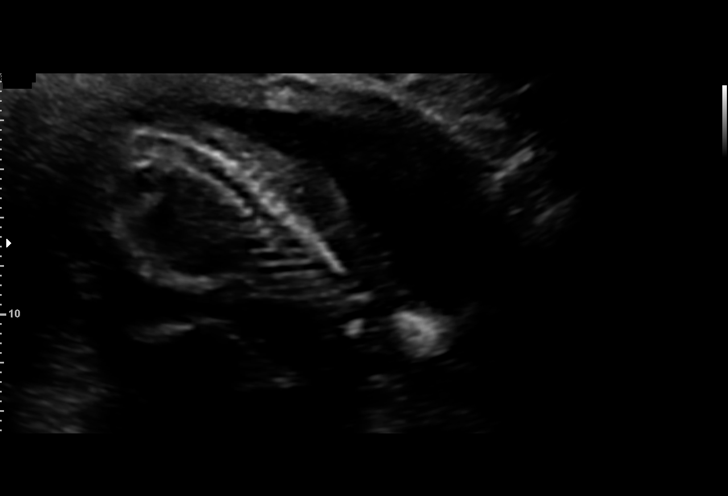
[im 39/51]
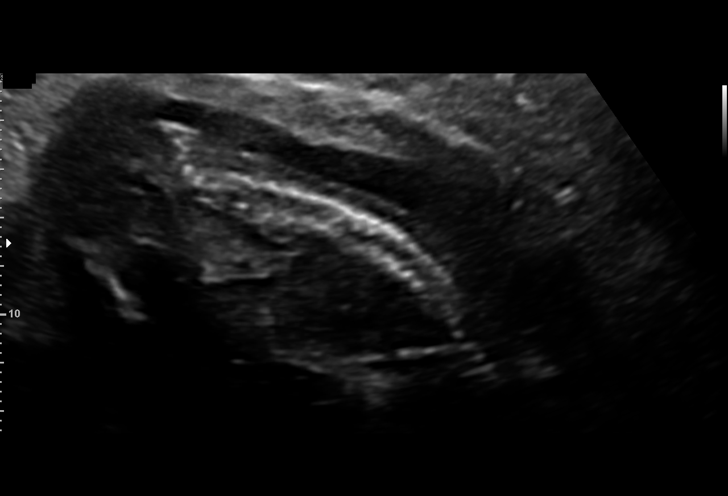
[im 43/51]
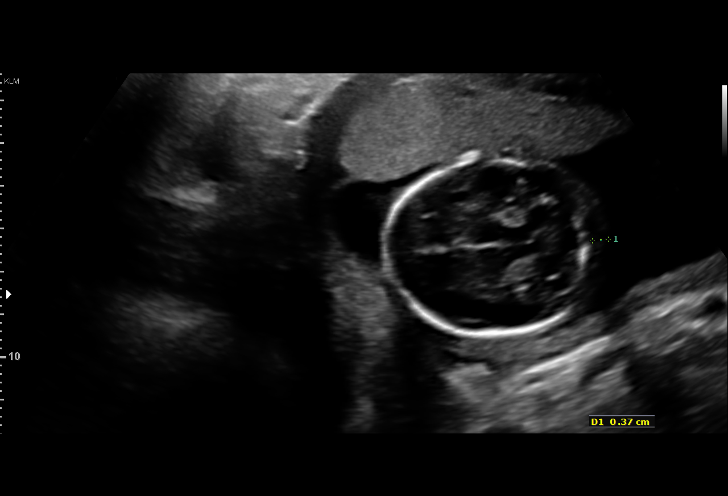
[im 47/51]
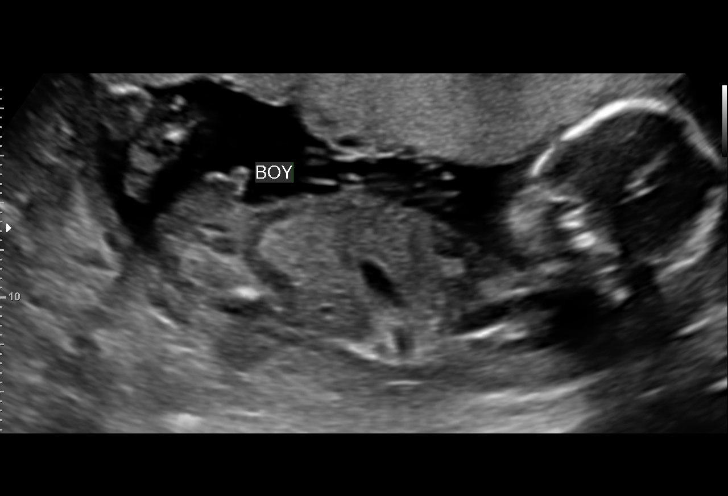
[im 51/51]
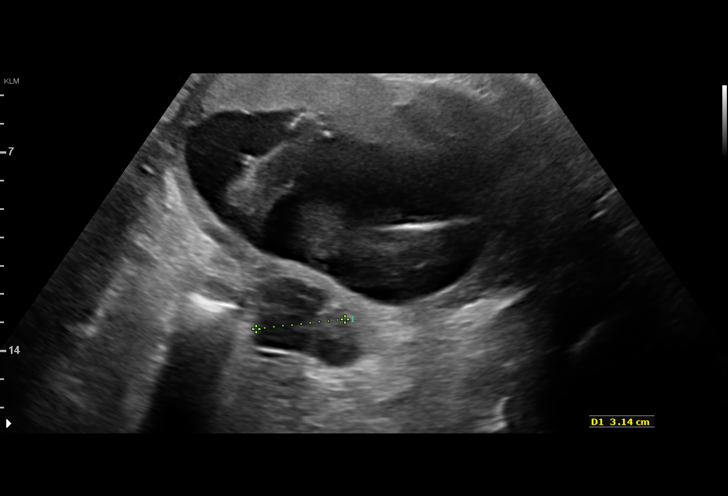

[14 of 28 positions shown; findings below may reference images not displayed]

1  TIGER            [PHONE_NUMBER]      [PHONE_NUMBER]     [PHONE_NUMBER]
Indications

19 weeks gestation of pregnancy
Advanced maternal age multigravida 35+,        [4Z]
second trimester (had low risk NIPS, neg
quad screen)
Ovarian cyst complicating pregnancy            [4Z]
(removed [DATE]); benign
Hypertension - Chronic/Pre-existing;           [4Z]
labetalol and ASA
Obesity complicating pregnancy, second         [4Z]
trimester
Encounter for antenatal screening for          [4Z]
malformations
OB History

Blood Type:            Height:  5'3"   Weight (lb):  254      BMI:
Gravidity:    3         Term:   2        Prem:   0        SAB:   0
TOP:          0       Ectopic:  0        Living: 2
Fetal Evaluation

Num Of Fetuses:     1
Fetal Heart         161
Rate(bpm):
Cardiac Activity:   Observed
Presentation:       Cephalic
Placenta:           Anterior, above cervical os
P. Cord Insertion:  Visualized, central

Amniotic Fluid
AFI FV:      Subjectively within normal limits
Biometry

BPD:      41.9  mm     G. Age:  18w 5d         37  %    CI:        76.12   %   70 - 86
FL/HC:      17.0   %   16.1 -
HC:      152.2  mm     G. Age:  18w 2d         12  %    HC/AC:      1.14       1.09 -
AC:       133   mm     G. Age:  18w 6d         39  %    FL/BPD:     61.6   %
FL:       25.8  mm     G. Age:  17w 6d         10  %    FL/AC:      19.4   %   20 - 24
CER:      18.3  mm     G. Age:  18w 1d         25  %

Est. FW:     235  gm      0 lb 8 oz     32  %
Gestational Age

LMP:           19w 0d       Date:   [DATE]                 EDD:   [DATE]
U/S Today:     18w 3d                                        EDD:   [DATE]
Best:          19w 0d    Det. By:   LMP  ([DATE])          EDD:   [DATE]
Anatomy

Cranium:               Appears normal         Aortic Arch:            Not well visualized
Cavum:                 Appears normal         Ductal Arch:            Not well visualized
Ventricles:            Appears normal         Diaphragm:              Appears normal
Choroid Plexus:        Appears normal         Stomach:                Appears normal, left
sided
Cerebellum:            Appears normal         Abdomen:                Appears normal
Posterior Fossa:       Appears normal         Abdominal Wall:         Not well visualized
Nuchal Fold:           Appears normal         Cord Vessels:           Appears normal (3
vessel cord)
Face:                  Orbits nl; profile not Kidneys:                Appear normal
well visualized
Lips:                  Appears normal         Bladder:                Appears normal
Thoracic:              Appears normal         Spine:                  Appears normal
Heart:                 Not well visualized    Upper Extremities:      Appears normal
RVOT:                  Not well visualized    Lower Extremities:      Appears normal
LVOT:                  Not well visualized

Other:  Fetus appears to be a male. Technically difficult due to maternal
habitus.
Cervix Uterus Adnexa

Cervix
Length:            4.4  cm.
Normal appearance by transabdominal scan.

Uterus
No abnormality visualized.

Left Ovary
Within normal limits.

Right Ovary
Within normal limits.

Cul De Sac:   No free fluid seen.

Adnexa:       No abnormality visualized.
Impression

SIUP at 19+0 weeks
Normal detailed fetal anatomy; limited views of heart, CI and
profile
Markers of aneuploidy: none
Normal amniotic fluid volume
Measurements consistent with LMP dating
Recommendations

Follow-up ultrasound in 4 weeks to complete anatomy survey

## 2016-01-25 ENCOUNTER — Other Ambulatory Visit (HOSPITAL_COMMUNITY): Payer: Self-pay | Admitting: *Deleted

## 2016-01-25 DIAGNOSIS — O10919 Unspecified pre-existing hypertension complicating pregnancy, unspecified trimester: Secondary | ICD-10-CM

## 2016-02-04 ENCOUNTER — Inpatient Hospital Stay (HOSPITAL_COMMUNITY)
Admission: AD | Admit: 2016-02-04 | Discharge: 2016-02-05 | DRG: 778 | Disposition: A | Discharge status: Home / Self Care | Payer: Medicaid Other | Source: Ambulatory Visit | Attending: Obstetrics and Gynecology | Admitting: Obstetrics and Gynecology

## 2016-02-04 ENCOUNTER — Encounter (HOSPITAL_COMMUNITY): Payer: Self-pay | Admitting: *Deleted

## 2016-02-04 ENCOUNTER — Inpatient Hospital Stay (HOSPITAL_COMMUNITY): Payer: Medicaid Other

## 2016-02-04 DIAGNOSIS — O10012 Pre-existing essential hypertension complicating pregnancy, second trimester: Secondary | ICD-10-CM | POA: Diagnosis present

## 2016-02-04 DIAGNOSIS — O0990 Supervision of high risk pregnancy, unspecified, unspecified trimester: Secondary | ICD-10-CM

## 2016-02-04 DIAGNOSIS — Z3A20 20 weeks gestation of pregnancy: Secondary | ICD-10-CM | POA: Diagnosis not present

## 2016-02-04 DIAGNOSIS — Z833 Family history of diabetes mellitus: Secondary | ICD-10-CM

## 2016-02-04 DIAGNOSIS — O26899 Other specified pregnancy related conditions, unspecified trimester: Secondary | ICD-10-CM

## 2016-02-04 DIAGNOSIS — K5900 Constipation, unspecified: Secondary | ICD-10-CM | POA: Diagnosis present

## 2016-02-04 DIAGNOSIS — O99212 Obesity complicating pregnancy, second trimester: Secondary | ICD-10-CM | POA: Diagnosis present

## 2016-02-04 DIAGNOSIS — Z87891 Personal history of nicotine dependence: Secondary | ICD-10-CM | POA: Diagnosis not present

## 2016-02-04 DIAGNOSIS — O4700 False labor before 37 completed weeks of gestation, unspecified trimester: Secondary | ICD-10-CM | POA: Insufficient documentation

## 2016-02-04 DIAGNOSIS — O09522 Supervision of elderly multigravida, second trimester: Secondary | ICD-10-CM | POA: Diagnosis not present

## 2016-02-04 DIAGNOSIS — O10919 Unspecified pre-existing hypertension complicating pregnancy, unspecified trimester: Secondary | ICD-10-CM

## 2016-02-04 DIAGNOSIS — Z3402 Encounter for supervision of normal first pregnancy, second trimester: Secondary | ICD-10-CM

## 2016-02-04 DIAGNOSIS — Z8249 Family history of ischemic heart disease and other diseases of the circulatory system: Secondary | ICD-10-CM | POA: Diagnosis not present

## 2016-02-04 DIAGNOSIS — O99210 Obesity complicating pregnancy, unspecified trimester: Secondary | ICD-10-CM

## 2016-02-04 DIAGNOSIS — G8918 Other acute postprocedural pain: Secondary | ICD-10-CM

## 2016-02-04 DIAGNOSIS — O479 False labor, unspecified: Secondary | ICD-10-CM | POA: Insufficient documentation

## 2016-02-04 DIAGNOSIS — O4702 False labor before 37 completed weeks of gestation, second trimester: Secondary | ICD-10-CM | POA: Diagnosis not present

## 2016-02-04 DIAGNOSIS — R109 Unspecified abdominal pain: Secondary | ICD-10-CM

## 2016-02-04 LAB — URINE MICROSCOPIC-ADD ON

## 2016-02-04 LAB — URINALYSIS, ROUTINE W REFLEX MICROSCOPIC
BILIRUBIN URINE: NEGATIVE
Glucose, UA: NEGATIVE mg/dL
KETONES UR: NEGATIVE mg/dL
NITRITE: NEGATIVE
PH: 6 (ref 5.0–8.0)
Protein, ur: NEGATIVE mg/dL

## 2016-02-04 LAB — TYPE AND SCREEN
ABO/RH(D): B POS
ANTIBODY SCREEN: NEGATIVE

## 2016-02-04 LAB — WET PREP, GENITAL
CLUE CELLS WET PREP: NONE SEEN
Sperm: NONE SEEN
Trich, Wet Prep: NONE SEEN
Yeast Wet Prep HPF POC: NONE SEEN

## 2016-02-04 LAB — CBC WITH DIFFERENTIAL/PLATELET
BASOS PCT: 0 %
Basophils Absolute: 0 10*3/uL (ref 0.0–0.1)
EOS ABS: 0.2 10*3/uL (ref 0.0–0.7)
EOS PCT: 2 %
HCT: 35.9 % — ABNORMAL LOW (ref 36.0–46.0)
Hemoglobin: 12 g/dL (ref 12.0–15.0)
LYMPHS ABS: 2.7 10*3/uL (ref 0.7–4.0)
Lymphocytes Relative: 23 %
MCH: 25.2 pg — AB (ref 26.0–34.0)
MCHC: 33.4 g/dL (ref 30.0–36.0)
MCV: 75.4 fL — ABNORMAL LOW (ref 78.0–100.0)
MONOS PCT: 4 %
Monocytes Absolute: 0.4 10*3/uL (ref 0.1–1.0)
Neutro Abs: 8.6 10*3/uL — ABNORMAL HIGH (ref 1.7–7.7)
Neutrophils Relative %: 71 %
PLATELETS: 234 10*3/uL (ref 150–400)
RBC: 4.76 MIL/uL (ref 3.87–5.11)
RDW: 15.9 % — ABNORMAL HIGH (ref 11.5–15.5)
WBC: 12 10*3/uL — AB (ref 4.0–10.5)

## 2016-02-04 LAB — OB RESULTS CONSOLE GC/CHLAMYDIA: GC PROBE AMP, GENITAL: NEGATIVE

## 2016-02-04 IMAGING — US US MFM OB LIMITED
1 series · 15 of 28 positions shown · non-contrast
Comparison: none

[Series 1: us mfm ob limited · 32 acquisitions, 15 frames shown]
[im 1/32]
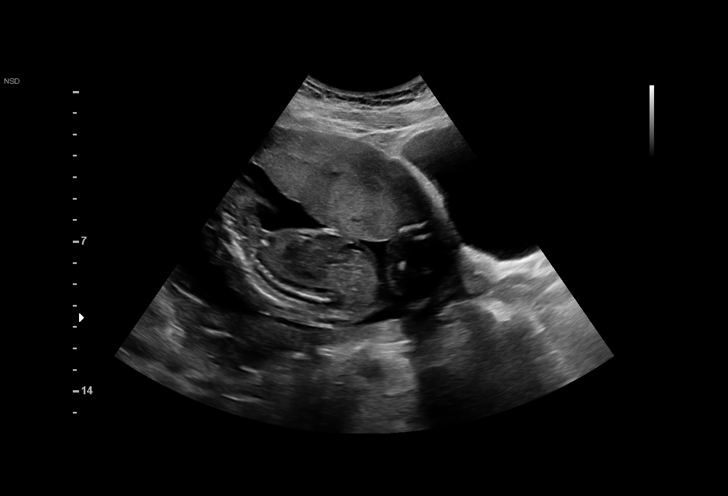
[im 3/32]
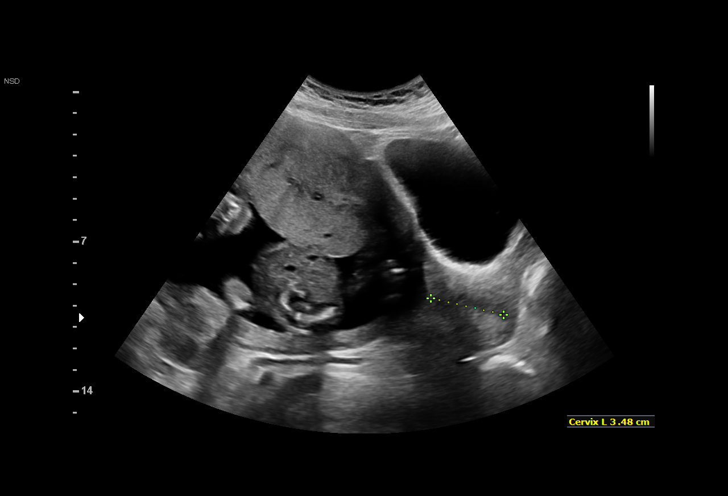
[im 5/32]
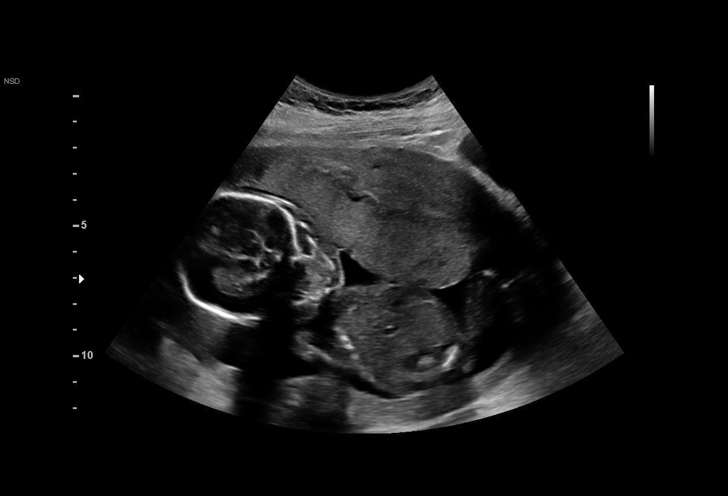
[im 7/32]
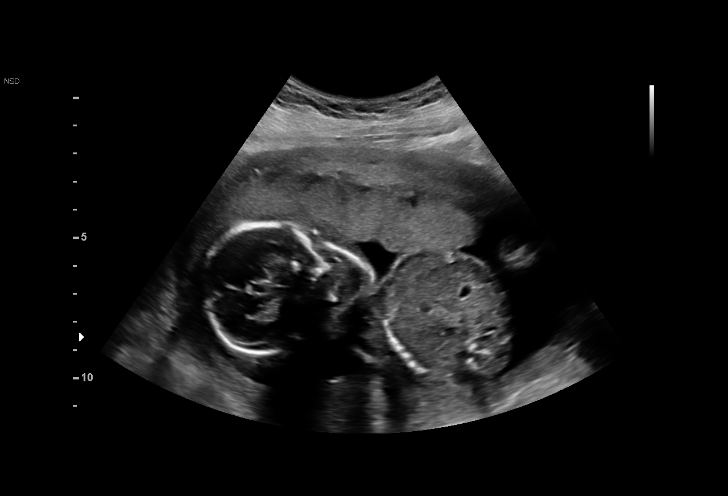
[im 10/32]
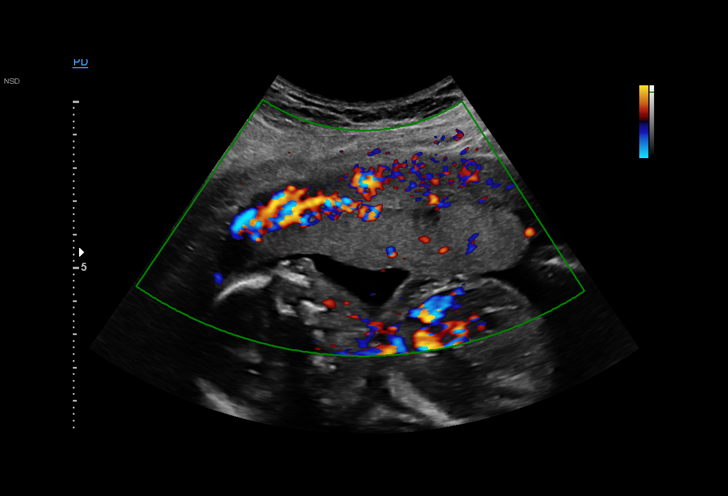
[im 12/32]
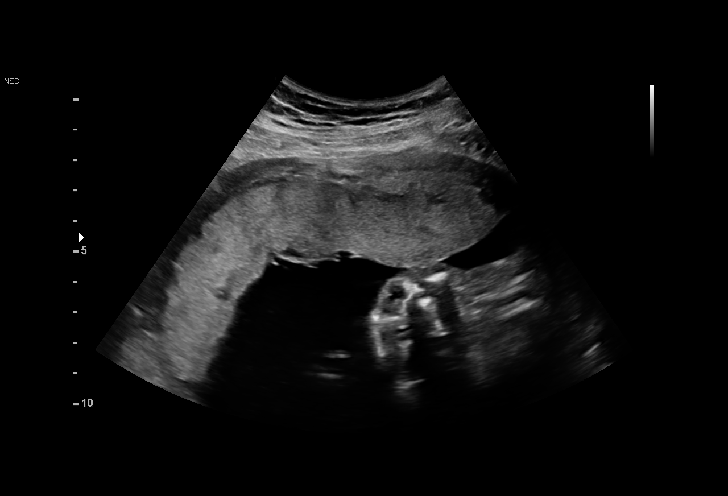
[im 14/32]
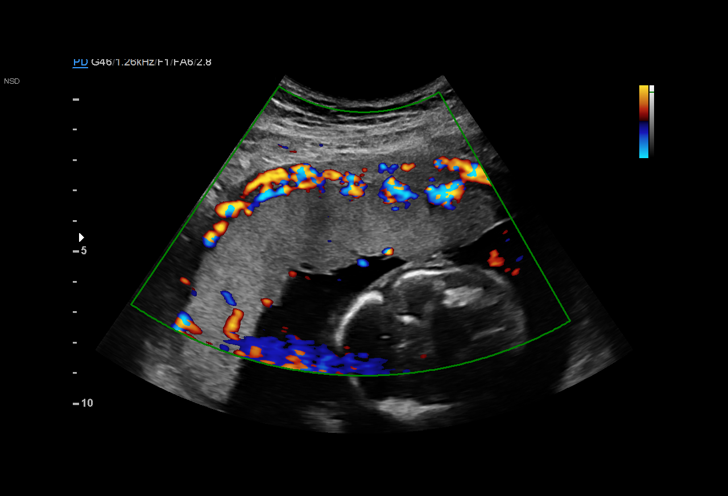
[im 17/32]
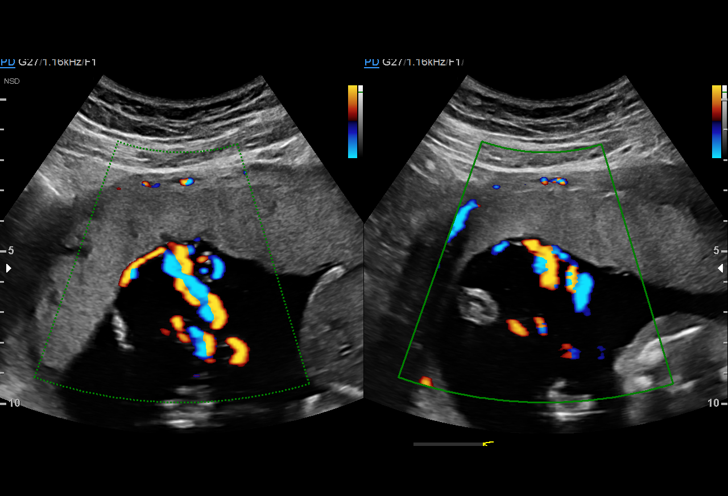
[im 18/32]
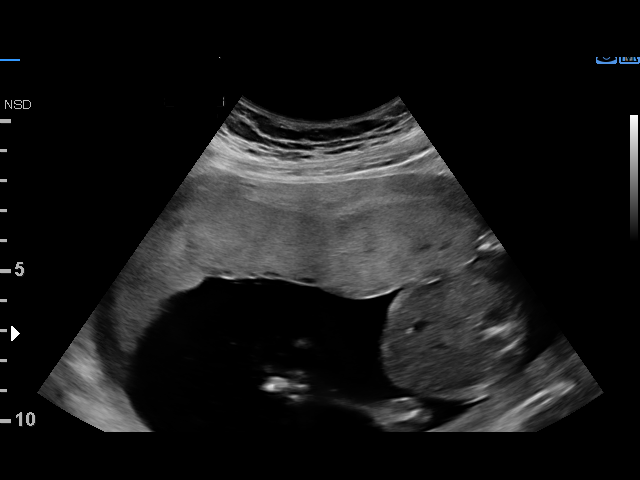
[im 20/32]
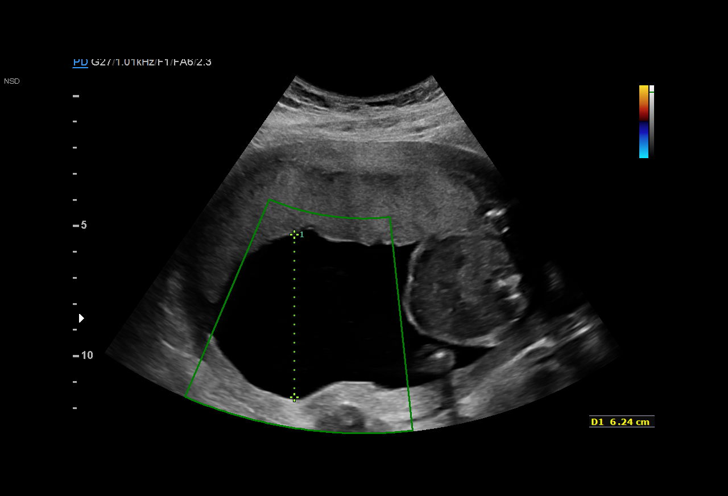
[im 22/32]
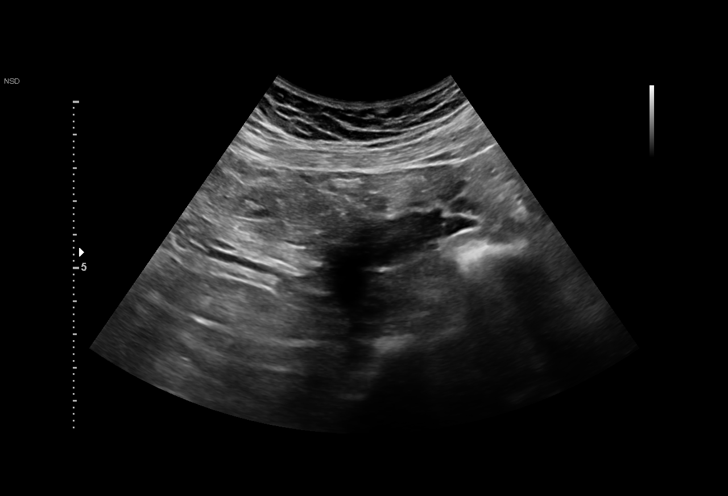
[im 25/32]
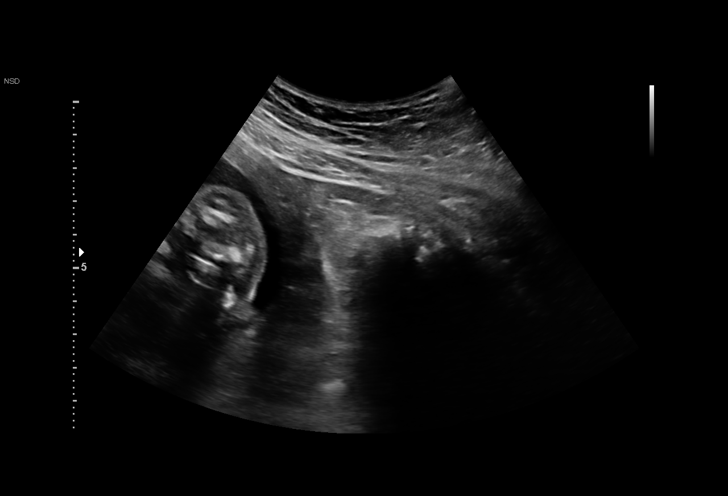
[im 27/32]
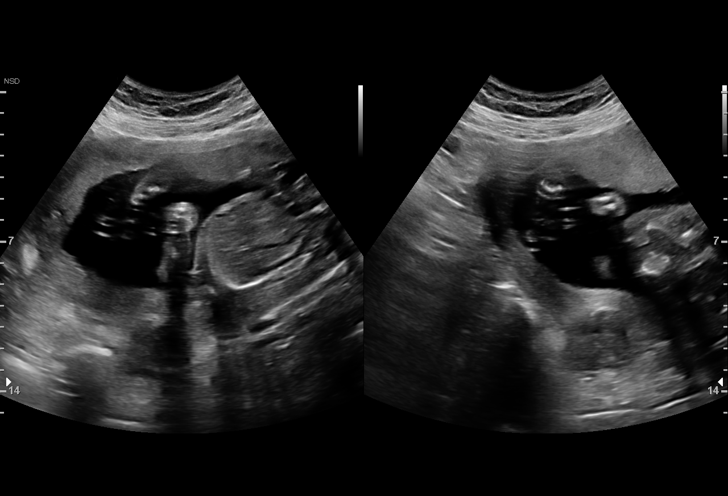
[im 29/32]
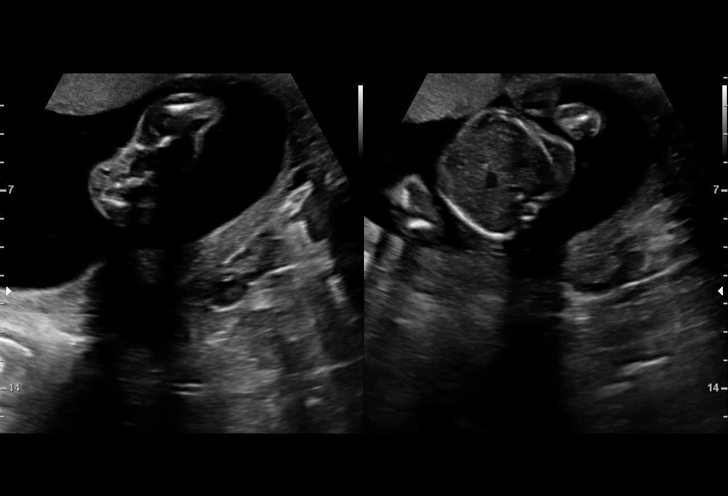
[im 32/32]
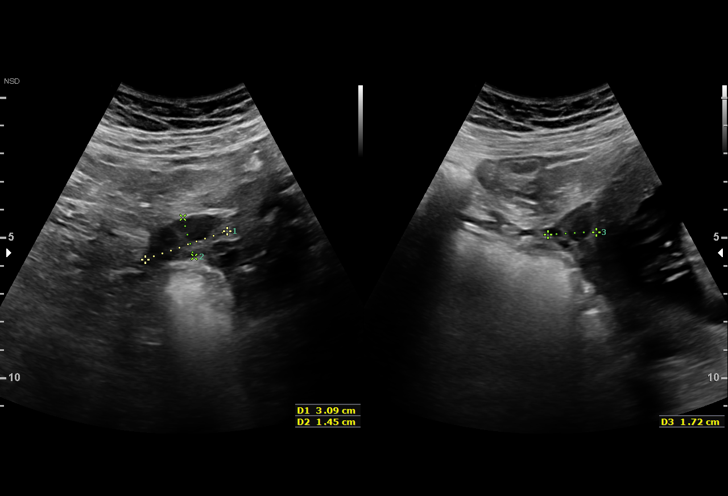

[15 of 28 positions shown; findings below may reference images not displayed]

MAU/Triage
DON LOLITO DO

1  DON LOLITO          [PHONE_NUMBER]      [PHONE_NUMBER]     [PHONE_NUMBER]
Indications

20 weeks gestation of pregnancy
Advanced maternal age multigravida 35+,        [18]
second trimester (had low risk NIPS, neg
quad screen)
Ovarian cyst complicating pregnancy            [18]
(removed [DATE]); benign
Hypertension - Chronic/Pre-existing;           [18]
labetalol and ASA
Obesity complicating pregnancy, second         [18]
trimester
Vaginal bleeding in pregnancy, second          [18]
trimester
R/O Placental Abruption
Abdominal pain in pregnancy                    [18]
OB History

Blood Type:            Height:  5'3"   Weight (lb):  254       BMI:
Gravidity:    3         Term:   2        Prem:   0        SAB:   0
TOP:          0       Ectopic:  0        Living: 2
Fetal Evaluation

Num Of Fetuses:     1
Fetal Heart         162
Rate(bpm):
Cardiac Activity:   Observed
Presentation:       Breech
Placenta:           Anterior, above cervical os
P. Cord Insertion:  Visualized, central
Amniotic Fluid
AFI FV:      Subjectively within normal limits

Largest Pocket(cm)
6.2
Gestational Age

LMP:           20w 4d        Date:  [DATE]                 EDD:   [DATE]
Best:          20w 4d     Det. By:  LMP  ([DATE])          EDD:   [DATE]
Cervix Uterus Adnexa

Cervix
Length:            3.5  cm.
Normal appearance by transabdominal scan.

Uterus
Single fibroid noted, see table below.

Left Ovary
Within normal limits.

Right Ovary
Within normal limits.
Myomas

Site                     L(cm)      W(cm)      D(cm)      Location
Posterior

Blood Flow                 RI        PI       Comments

Impression

IUP at 20+4 weeks with abdominal pain and minimal vaginal
bleeding, S/P laparoscopic cystectomy
Normal fetal movement and cardiac activity
Normal placentation with nno evidence of abruption
Normal amniotic fluid
Transabdominal cervical length normal at 35mm
Maternal ovaries normal
Recommendations

No sonographic evidence of fetal comromise or obstetric
cause of her pain. Follow-up ultrasounds as clinically
indicated.

## 2016-02-04 MED ORDER — DOCUSATE SODIUM 100 MG PO CAPS
100.0000 mg | ORAL_CAPSULE | Freq: Every day | ORAL | Status: DC
Start: 2016-02-04 — End: 2016-02-05
  Administered 2016-02-05: 100 mg via ORAL
  Filled 2016-02-04: qty 1

## 2016-02-04 MED ORDER — NALBUPHINE HCL 10 MG/ML IJ SOLN
10.0000 mg | INTRAMUSCULAR | Status: DC | PRN
  Administered 2016-02-04: 10 mg via INTRAVENOUS
  Filled 2016-02-04: qty 1

## 2016-02-04 MED ORDER — ACETAMINOPHEN 325 MG PO TABS
650.0000 mg | ORAL_TABLET | ORAL | Status: DC | PRN
  Administered 2016-02-04: 650 mg via ORAL
  Filled 2016-02-04: qty 2

## 2016-02-04 MED ORDER — CALCIUM CARBONATE ANTACID 500 MG PO CHEW: 2.0000 | CHEWABLE_TABLET | ORAL | Status: DC | PRN

## 2016-02-04 MED ORDER — LACTATED RINGERS IV BOLUS (SEPSIS)
1000.0000 mL | Freq: Once | INTRAVENOUS | Status: AC
  Administered 2016-02-04: 1000 mL via INTRAVENOUS

## 2016-02-04 MED ORDER — LABETALOL HCL 100 MG PO TABS
100.0000 mg | ORAL_TABLET | Freq: Two times a day (BID) | ORAL | Status: DC
  Administered 2016-02-04 – 2016-02-05 (×2): 100 mg via ORAL
  Filled 2016-02-04 (×2): qty 1

## 2016-02-04 MED ORDER — PRENATAL MULTIVITAMIN CH: 1.0000 | ORAL_TABLET | Freq: Every day | ORAL | Status: DC

## 2016-02-04 MED ORDER — PROMETHAZINE HCL 25 MG/ML IJ SOLN
12.5000 mg | INTRAMUSCULAR | Status: DC | PRN
  Administered 2016-02-04: 12.5 mg via INTRAVENOUS
  Filled 2016-02-04: qty 1

## 2016-02-04 MED ORDER — LACTATED RINGERS IV SOLN
INTRAVENOUS | Status: DC
  Administered 2016-02-04 – 2016-02-05 (×2): via INTRAVENOUS

## 2016-02-04 MED ORDER — ZOLPIDEM TARTRATE 5 MG PO TABS: 5.0000 mg | ORAL_TABLET | Freq: Every evening | ORAL | Status: DC | PRN

## 2016-02-04 MED ORDER — HYDROMORPHONE HCL 1 MG/ML IJ SOLN
1.0000 mg | INTRAMUSCULAR | Status: DC | PRN
  Administered 2016-02-04 – 2016-02-05 (×5): 1 mg via INTRAVENOUS
  Filled 2016-02-04 (×5): qty 1

## 2016-02-04 NOTE — H&P (Signed)
ANTEPARTUM ADMISSION HISTORY AND PHYSICAL NOTE   History of Present Illness: Nichole Green is a 35 y.o. [id number] at [redacted]w[redacted]d admitted for previable preterm contractions/abdominal pain.  Patient just recently underwent robotic assisted laparoscopy for ovarian cystectomy on 10/24 by gyn-onc due to a complex ovarian cyst. Since then she has some abdominal discomfort, but starting 2 days ago, her abdominal discomfort became more painful like contractions and today were extremely painful and not going away. She states the contraction like pain is occurring about every 2-4 min, cannot breathe through them, crying, cannot walk. She noted some spotting yesterday but no vaginal bleeding or spotting today. She has had sex yesterday without a condom, but did not have worsening pain until today. Pain is mostly above suprapubic region, especially to palpate. She is urinating frequently but denies dysuria.   Patient reports the fetal movement as active. Patient reports uterine contraction  activity as regular, every 2-4 minutes. Patient reports  vaginal bleeding as none today, but some spotting yesterday. Patient describes fluid per vagina as None. Fetal presentation is unsure. Placenta is anterior above os on recent US and today's [place].  On SSE it was noted a dark red quarter-sized clot with some scant bright red blood. Cervix was closed, thick, high on SVE.  Patient has a pregnancy complicated by chronic HTN, she takes labetalol 100mg  BID for and baby aspirin. She is also AMA, but NIPS testing was negative. She has a pregnancy also complicated by a complex adnexal mass (recently removed).    Patient Active Problem List   Diagnosis Date Noted  . Preterm uterine contractions 02/04/2016  . Obesity affecting pregnancy, antepartum 12/13/2015  . Supervision of normal pregnancy 12/11/2015  . Chronic hypertension in pregnancy 12/11/2015  . Adnexal mass 11/03/2015  . AMA (advanced maternal age) multigravida 35+  10/31/2015  . Chronic hypertension during pregnancy, antepartum 10/31/2015    Past Medical History:  Diagnosis Date  . Headache    only when B/P elevated   . Hypertension   . Infection    UTI  . Pregnancy induced hypertension     Past Surgical History:  Procedure Laterality Date  . BREAST REDUCTION SURGERY    . ROBOTIC ASSISTED LAPAROSCOPIC OVARIAN CYSTECTOMY N/A 01/17/2016   Procedure: XI ROBOTIC ASSISTED LAPAROSCOPIC OVARIAN CYSTECTOMY;  Surgeon: Everitt Amber, MD;  Location: [facility];  Service: Gynecology;  Laterality: N/A;    OB History  Gravida Para Term Preterm AB Living  3 2 2  0 0 2  SAB TAB Ectopic Multiple Live Births  0 0 0 0 2    # Outcome Date GA Lbr Len/2nd Weight Sex Delivery Anes PTL Lv  3 Current           2 Term      Vag-Spont     1 Term      Vag-Spont       Obstetric Comments  Elevated BP with preg    Social History   Social History  . Marital status: Married    Spouse name: N/A  . Number of children: N/A  . Years of education: N/A   Social History Main Topics  . Smoking status: Former Smoker    Years: 2.00    Types: Cigarettes    Quit date: 10/25/2015  . Smokeless tobacco: Never Used     Comment: hookah  . Alcohol use No  . Drug use: No  . Sexual activity: Yes    Birth control/ protection: None   Other Topics Concern  .  None   Social History Narrative  . None    Family History  Problem Relation Age of Onset  . Hypertension Mother   . Diabetes Mother     No Known Allergies  Prescriptions Prior to Admission  Medication Sig Dispense Refill Last Dose  . aspirin EC 81 MG tablet Take 1 tablet (81 mg total) by mouth daily. 30 tablet 6 02/04/2016 at Unknown time  . labetalol (NORMODYNE) 100 MG tablet Take 1 tablet (100 mg total) by mouth 2 (two) times daily. (Patient taking differently: Take 100 mg by mouth 2 (two) times daily. Patient states she only takes if she has headache or she is dizzy.) 60 tablet 2 02/04/2016 at Unknown time  .  Prenatal Vit-Fe Fumarate-FA (PRENATAL MULTIVITAMIN) TABS tablet Take 1 tablet by mouth daily at 12 noon. (Patient taking differently: Take 1 tablet by mouth daily at 12 noon. ) 30 tablet 12 02/04/2016 at Unknown time  . oxyCODONE-acetaminophen (PERCOCET/ROXICET) 5-325 MG tablet Take 1-2 tablets by mouth every 4 (four) hours as needed for severe pain. (Patient not taking: Reported on 02/04/2016) 30 tablet 0 Not Taking at Unknown time  . promethazine (PHENERGAN) 12.5 MG tablet Take 1 tablet (12.5 mg total) by mouth every 6 (six) hours as needed for nausea or vomiting. (Patient not taking: Reported on 02/04/2016) 30 tablet 0 Not Taking at Unknown time    Review of Systems - Negative except per HPI  Vitals:  BP 138/69   Pulse 104   Temp 97.8 F (36.6 C)   Ht 5\' 1"  (1.549 m)   Wt 257 lb (116.6 kg)   LMP 09/13/2015   BMI 48.56 kg/m  Physical Examination: CONSTITUTIONAL: Well-developed, well-nourished female, in distress during contraction-like moments.  HENT:  Normocephalic, atraumatic EYES: Conjunctivae and EOM are normal. Pupils are equal, round, and reactive to light. No scleral icterus.  NECK: Normal range of motion, supple, no masses SKIN: Skin is warm and dry. No rash noted. Not diaphoretic. No erythema. No pallor. [place]: Alert and oriented to person, place, and time. Normal reflexes, muscle tone coordination. No cranial nerve deficit noted. PSYCHIATRIC: Normal mood and affect but noticeably in pain. Normal behavior. Normal judgment and thought content. CARDIOVASCULAR: Normal heart rate noted, regular rhythm RESPIRATORY: Effort and breath sounds normal, no problems with respiration noted ABDOMEN: Soft,+tenderness to lower pelvic region and mostly in suprapubic region, nondistended but obese abdomen, gravid. MUSCULOSKELETAL: Normal range of motion. Trace edema and no tenderness. 2+ distal pulses.  Cervix: Evaluated by sterile speculum exam and evaluated by digital exam and found to be  closed, thick, high and fetal presentation is unsure, per [place] is breech, per [place] has normal cervical length. Dark red quarter-sized clot with some scant bright red blood in vaginal vault and attached to cervical os.  Membranes: intact Fetal Monitoring:  US shows 160s, doppler 145 Tocometer: Flat  Labs:  Results for orders placed or performed during the hospital encounter of 02/04/16 (from the past 24 hour(s))  Wet prep, genital   Collection Time: 02/04/16  1:37 PM  Result Value Ref Range   Yeast Wet Prep HPF POC NONE SEEN NONE SEEN   Trich, Wet Prep NONE SEEN NONE SEEN   Clue Cells Wet Prep HPF POC NONE SEEN NONE SEEN   WBC, Wet Prep HPF POC MODERATE (A) NONE SEEN   Sperm NONE SEEN   Urinalysis, Routine w reflex microscopic (not at [facility])   Collection Time: 02/04/16  2:23 PM  Result Value Ref Range  Color, Urine YELLOW YELLOW   APPearance CLEAR CLEAR   Specific Gravity, Urine <1.005 (L) 1.005 - 1.030   pH 6.0 5.0 - 8.0   Glucose, UA NEGATIVE NEGATIVE mg/dL   Hgb urine dipstick MODERATE (A) NEGATIVE   Bilirubin Urine NEGATIVE NEGATIVE   Ketones, ur NEGATIVE NEGATIVE mg/dL   Protein, ur NEGATIVE NEGATIVE mg/dL   Nitrite NEGATIVE NEGATIVE   Leukocytes, UA TRACE (A) NEGATIVE  Urine microscopic-add on   Collection Time: 02/04/16  2:23 PM  Result Value Ref Range   Squamous Epithelial / LPF 0-5 (A) NONE SEEN   WBC, UA 0-5 0 - 5 WBC/hpf   RBC / HPF 0-5 0 - 5 RBC/hpf   Bacteria, UA RARE (A) NONE SEEN  CBC with Differential/Platelet   Collection Time: 02/04/16  4:36 PM  Result Value Ref Range   WBC 12.0 (H) 4.0 - 10.5 K/uL   RBC 4.76 3.87 - 5.11 MIL/uL   Hemoglobin 12.0 12.0 - 15.0 g/dL   HCT 35.9 (L) 36.0 - 46.0 %   MCV 75.4 (L) 78.0 - 100.0 fL   MCH 25.2 (L) 26.0 - 34.0 pg   MCHC 33.4 30.0 - 36.0 g/dL   RDW 15.9 (H) 11.5 - 15.5 %   Platelets 234 150 - 400 K/uL   Neutrophils Relative % 71 %   Neutro Abs 8.6 (H) 1.7 - 7.7 K/uL   Lymphocytes Relative 23 %   Lymphs Abs 2.7 0.7 -  4.0 K/uL   Monocytes Relative 4 %   Monocytes Absolute 0.4 0.1 - 1.0 K/uL   Eosinophils Relative 2 %   Eosinophils Absolute 0.2 0.0 - 0.7 K/uL   Basophils Relative 0 %   Basophils Absolute 0.0 0.0 - 0.1 K/uL  Type and screen [place]   Collection Time: 02/04/16  4:36 PM  Result Value Ref Range   ABO/RH(D) B POS    Antibody Screen NEG    Sample Expiration 02/07/2016     Imaging Studies: [place] Mfm Ob Detail +14 Wk  Result Date: 01/24/2016 OBSTETRICAL ULTRASOUND: This exam was performed within a [place] [facility]. The OB US report was generated in the AS system, and faxed to the ordering physician.  This report is available in the [facility]. See the AS Obstetric US report via the Image Link.    Assessment and Plan: Patient Active Problem List   Diagnosis Date Noted  . Preterm uterine contractions 02/04/2016  . Obesity affecting pregnancy, antepartum 12/13/2015  . Supervision of normal pregnancy 12/11/2015  . Chronic hypertension in pregnancy 12/11/2015  . Adnexal mass 11/03/2015  . AMA (advanced maternal age) multigravida 35+ 10/31/2015  . Chronic hypertension during pregnancy, antepartum 10/31/2015   Admit to Antenatal Observation, recheck cervix in AM Routine antenatal care Pre-viable currently Dopplers qshift IV Dilaudid for pain prn Continue PO labetalol for [place], DO OB Fellow Faculty [facility], [facility]

## 2016-02-04 NOTE — MAU Note (Addendum)
Onset of lower abdominal pain since Thursday getting worse, denies N/V/D, spotting yesterday morning, denies constipation. Patient had surgery to remove a right ovarian cyst 01/17/16

## 2016-02-04 NOTE — MAU Provider Note (Signed)
Chief Complaint:  Abdominal Pain   First Provider Initiated Contact with Patient 02/04/16 1343      HPI: Nichole Green is a 35 y.o. G3P2002 at [redacted]w[redacted]d who presents to maternity admissions reporting contractions/abdominal pain.  Patient just recently underwent robotic assisted laparoscopy for ovarian cystectomy on 10/24 by gyn-onc due to a complex ovarian cyst. Since then she has some abdominal discomfort, but starting 2 days ago, her abdominal discomfort became more painful like contractions and today were extremely painful and not going away. She states the contraction like pain is occurring about every 2-4 min, cannot breathe through them, crying, cannot walk. She noted some spotting yesterday but no vaginal bleeding or spotting today. She has had sex yesterday without a condom, but did not have worsening pain until today. Pain is mostly above suprapubic region, especially to palpate. She is urinating frequently but denies dysuria.   Patient reports the fetal movement as active. Patient reports uterine contraction  activity as regular, every 2-4 minutes. Patient reports  vaginal bleeding as none today, but some spotting yesterday. Patient describes fluid per vagina as None. Fetal presentation is unsure. Placenta is anterior above os on recent US and today's [place].    Pregnancy Course: Patient has a pregnancy complicated by chronic HTN, she takes labetalol 100mg  BID for and baby aspirin. She is also AMA, but NIPS testing was negative. She has a pregnancy also complicated by a complex adnexal mass (recently removed).  Past Medical History: Past Medical History:  Diagnosis Date  . Headache    only when B/P elevated   . Hypertension   . Infection    UTI  . Pregnancy induced hypertension     Past obstetric history: OB History  Gravida Para Term Preterm AB Living  3 2 2  0 0 2  SAB TAB Ectopic Multiple Live Births  0 0 0 0 2    # Outcome Date GA Lbr Len/2nd Weight Sex Delivery Anes PTL Lv   3 Current           2 Term      Vag-Spont     1 Term      Vag-Spont       Obstetric Comments  Elevated BP with preg    Past Surgical History: Past Surgical History:  Procedure Laterality Date  . BREAST REDUCTION SURGERY    . ROBOTIC ASSISTED LAPAROSCOPIC OVARIAN CYSTECTOMY N/A 01/17/2016   Procedure: XI ROBOTIC ASSISTED LAPAROSCOPIC OVARIAN CYSTECTOMY;  Surgeon: Everitt Amber, MD;  Location: [facility];  Service: Gynecology;  Laterality: N/A;     Family History: Family History  Problem Relation Age of Onset  . Hypertension Mother   . Diabetes Mother     Social History: Social History  Substance Use Topics  . Smoking status: Former Smoker    Years: 2.00    Types: Cigarettes    Quit date: 10/25/2015  . Smokeless tobacco: Never Used     Comment: hookah  . Alcohol use No    Allergies: No Known Allergies  Meds:  Prescriptions Prior to Admission  Medication Sig Dispense Refill Last Dose  . aspirin EC 81 MG tablet Take 1 tablet (81 mg total) by mouth daily. 30 tablet 6 02/04/2016 at Unknown time  . labetalol (NORMODYNE) 100 MG tablet Take 1 tablet (100 mg total) by mouth 2 (two) times daily. (Patient taking differently: Take 100 mg by mouth 2 (two) times daily. Patient states she only takes if she has headache or she is dizzy.) 60  tablet 2 02/04/2016 at Unknown time  . Prenatal Vit-Fe Fumarate-FA (PRENATAL MULTIVITAMIN) TABS tablet Take 1 tablet by mouth daily at 12 noon. (Patient taking differently: Take 1 tablet by mouth daily at 12 noon. ) 30 tablet 12 02/04/2016 at Unknown time  . oxyCODONE-acetaminophen (PERCOCET/ROXICET) 5-325 MG tablet Take 1-2 tablets by mouth every 4 (four) hours as needed for severe pain. (Patient not taking: Reported on 02/04/2016) 30 tablet 0 Not Taking at Unknown time  . promethazine (PHENERGAN) 12.5 MG tablet Take 1 tablet (12.5 mg total) by mouth every 6 (six) hours as needed for nausea or vomiting. (Patient not taking: Reported on 02/04/2016) 30 tablet 0  Not Taking at Unknown time    I have reviewed patient's Past Medical Hx, Surgical Hx, Family Hx, Social Hx, medications and allergies.   ROS:  A comprehensive ROS was negative except per HPI.    Physical Exam  Patient Vitals for the past 24 hrs:  BP Temp Pulse Height Weight  02/04/16 1255 138/69 97.8 F (36.6 C) 104 5\' 1"  (1.549 m) 257 lb (116.6 kg)   CONSTITUTIONAL: Well-developed, well-nourished female, in distress during contraction-like moments.  HENT:  Normocephalic, atraumatic EYES: Conjunctivae and EOM are normal. Pupils are equal, round, and reactive to light. No scleral icterus.  NECK: Normal range of motion, supple, no masses SKIN: Skin is warm and dry. No rash noted. Not diaphoretic. No erythema. No pallor. Felicity: Alert and oriented to person, place, and time. Normal reflexes, muscle tone coordination. No cranial nerve deficit noted. PSYCHIATRIC: Normal mood and affect but noticeably in pain. Normal behavior. Normal judgment and thought content. CARDIOVASCULAR: Normal heart rate noted, regular rhythm RESPIRATORY: Effort and breath sounds normal, no problems with respiration noted ABDOMEN: Soft,+tenderness to lower pelvic region and mostly in suprapubic region, nondistended but obese abdomen, gravid. MUSCULOSKELETAL: Normal range of motion. Trace edema and no tenderness. 2+ distal pulses.  Cervix: Evaluated by sterile speculum exam and evaluated by digital exam and found to be closed, thick, high and fetal presentation is unsure, per [place] is breech, per [place] has normal cervical length. Dark red quarter-sized clot with some scant bright red blood in vaginal vault and attached to cervical os.  Membranes: intact Fetal Monitoring:  US shows 160s, doppler 145 Tocometer: Flat   Labs: Results for orders placed or performed during the hospital encounter of 02/04/16 (from the past 24 hour(s))  Wet prep, genital     Status: Abnormal   Collection Time: 02/04/16  1:37 PM  Result  Value Ref Range   Yeast Wet Prep HPF POC NONE SEEN NONE SEEN   Trich, Wet Prep NONE SEEN NONE SEEN   Clue Cells Wet Prep HPF POC NONE SEEN NONE SEEN   WBC, Wet Prep HPF POC MODERATE (A) NONE SEEN   Sperm NONE SEEN   Urinalysis, Routine w reflex microscopic (not at [facility])     Status: Abnormal   Collection Time: 02/04/16  2:23 PM  Result Value Ref Range   Color, Urine YELLOW YELLOW   APPearance CLEAR CLEAR   Specific Gravity, Urine <1.005 (L) 1.005 - 1.030   pH 6.0 5.0 - 8.0   Glucose, UA NEGATIVE NEGATIVE mg/dL   Hgb urine dipstick MODERATE (A) NEGATIVE   Bilirubin Urine NEGATIVE NEGATIVE   Ketones, ur NEGATIVE NEGATIVE mg/dL   Protein, ur NEGATIVE NEGATIVE mg/dL   Nitrite NEGATIVE NEGATIVE   Leukocytes, UA TRACE (A) NEGATIVE  Urine microscopic-add on     Status: Abnormal   Collection Time:  02/04/16  2:23 PM  Result Value Ref Range   Squamous Epithelial / LPF 0-5 (A) NONE SEEN   WBC, UA 0-5 0 - 5 WBC/hpf   RBC / HPF 0-5 0 - 5 RBC/hpf   Bacteria, UA RARE (A) NONE SEEN    Imaging:  [place] Mfm Ob Detail +14 Wk  Result Date: 01/24/2016 OBSTETRICAL ULTRASOUND: This exam was performed within a [place] Ultrasound Department. The OB US report was generated in the AS system, and faxed to the ordering physician.  This report is available in the [facility]. See the AS Obstetric US report via the Image Link.   MAU Course: Labs - CBC w/ diff (no sign of infection or blood loss anemia), type/screen Urine culture GC/CT, Wet prep (neg) Ultrasound to assess for abruption Dopplers with +good heart tones SSE - +clot (dark red, quarter-sized) and scant bright red blood SVE - closed/thick/high  MDM: Plan of care reviewed with patient, including labs and tests ordered and medical treatment. Patient in agreement to be admitted and observed.    Assessment: 1. Obesity affecting pregnancy, antepartum   2. Encounter for supervision of normal first pregnancy in second trimester   3. Chronic  hypertension in pregnancy   4. Elderly multigravida in second trimester   5. Chronic hypertension during pregnancy, antepartum   6. Abdominal pain affecting pregnancy     Plan: Admit to inpatient for antepartum observation Pre-viable   Katherine Basset, DO OB Fellow 02/04/2016 3:52 PM

## 2016-02-04 NOTE — Progress Notes (Signed)
Patient ate supper brought in by her husband and reported to RN that she feels much better. Pain to rt lower abdomen and head is much improved. No pain score given. No vaginal leaking or spotting reported or noted at this time. We will continue to monitor.

## 2016-02-05 DIAGNOSIS — Z3A20 20 weeks gestation of pregnancy: Secondary | ICD-10-CM

## 2016-02-05 DIAGNOSIS — O4702 False labor before 37 completed weeks of gestation, second trimester: Secondary | ICD-10-CM

## 2016-02-05 DIAGNOSIS — G8918 Other acute postprocedural pain: Secondary | ICD-10-CM

## 2016-02-05 DIAGNOSIS — R109 Unspecified abdominal pain: Secondary | ICD-10-CM

## 2016-02-05 DIAGNOSIS — O26899 Other specified pregnancy related conditions, unspecified trimester: Secondary | ICD-10-CM

## 2016-02-05 LAB — CBC
HEMATOCRIT: 32.3 % — AB (ref 36.0–46.0)
Hemoglobin: 10.6 g/dL — ABNORMAL LOW (ref 12.0–15.0)
MCH: 25 pg — AB (ref 26.0–34.0)
MCHC: 32.8 g/dL (ref 30.0–36.0)
MCV: 76.2 fL — AB (ref 78.0–100.0)
PLATELETS: 194 10*3/uL (ref 150–400)
RBC: 4.24 MIL/uL (ref 3.87–5.11)
RDW: 16.2 % — AB (ref 11.5–15.5)
WBC: 11.7 10*3/uL — ABNORMAL HIGH (ref 4.0–10.5)

## 2016-02-05 MED ORDER — BISACODYL 5 MG PO TBEC
5.0000 mg | DELAYED_RELEASE_TABLET | Freq: Every day | ORAL | Status: DC | PRN
  Administered 2016-02-05: 5 mg via ORAL
  Filled 2016-02-05 (×3): qty 1

## 2016-02-05 MED ORDER — OXYCODONE-ACETAMINOPHEN 5-325 MG PO TABS: 1.0000 | ORAL_TABLET | ORAL | 0 refills | Status: DC | PRN

## 2016-02-05 NOTE — Discharge Summary (Signed)
Antenatal Physician Discharge Summary  Patient ID: Nichole Green MRN: [id number] DOB/AGE: December 17, 1980 35 y.o.  Admit date: 02/04/2016 Discharge date: 02/05/2016  Admission Diagnoses: post op pain; 20 week IUp  Discharge Diagnoses: same  Prenatal Procedures: none   Significant Diagnostic Studies:  Results for orders placed or performed during the hospital encounter of 02/04/16 (from the past 168 hour(s))  Wet prep, genital   Collection Time: 02/04/16  1:37 PM  Result Value Ref Range   Yeast Wet Prep HPF POC NONE SEEN NONE SEEN   Trich, Wet Prep NONE SEEN NONE SEEN   Clue Cells Wet Prep HPF POC NONE SEEN NONE SEEN   WBC, Wet Prep HPF POC MODERATE (A) NONE SEEN   Sperm NONE SEEN   Urinalysis, Routine w reflex microscopic (not at [facility])   Collection Time: 02/04/16  2:23 PM  Result Value Ref Range   Color, Urine YELLOW YELLOW   APPearance CLEAR CLEAR   Specific Gravity, Urine <1.005 (L) 1.005 - 1.030   pH 6.0 5.0 - 8.0   Glucose, UA NEGATIVE NEGATIVE mg/dL   Hgb urine dipstick MODERATE (A) NEGATIVE   Bilirubin Urine NEGATIVE NEGATIVE   Ketones, ur NEGATIVE NEGATIVE mg/dL   Protein, ur NEGATIVE NEGATIVE mg/dL   Nitrite NEGATIVE NEGATIVE   Leukocytes, UA TRACE (A) NEGATIVE  Urine microscopic-add on   Collection Time: 02/04/16  2:23 PM  Result Value Ref Range   Squamous Epithelial / LPF 0-5 (A) NONE SEEN   WBC, UA 0-5 0 - 5 WBC/hpf   RBC / HPF 0-5 0 - 5 RBC/hpf   Bacteria, UA RARE (A) NONE SEEN  CBC with Differential/Platelet   Collection Time: 02/04/16  4:36 PM  Result Value Ref Range   WBC 12.0 (H) 4.0 - 10.5 K/uL   RBC 4.76 3.87 - 5.11 MIL/uL   Hemoglobin 12.0 12.0 - 15.0 g/dL   HCT 35.9 (L) 36.0 - 46.0 %   MCV 75.4 (L) 78.0 - 100.0 fL   MCH 25.2 (L) 26.0 - 34.0 pg   MCHC 33.4 30.0 - 36.0 g/dL   RDW 15.9 (H) 11.5 - 15.5 %   Platelets 234 150 - 400 K/uL   Neutrophils Relative % 71 %   Neutro Abs 8.6 (H) 1.7 - 7.7 K/uL   Lymphocytes Relative 23 %   Lymphs Abs 2.7 0.7  - 4.0 K/uL   Monocytes Relative 4 %   Monocytes Absolute 0.4 0.1 - 1.0 K/uL   Eosinophils Relative 2 %   Eosinophils Absolute 0.2 0.0 - 0.7 K/uL   Basophils Relative 0 %   Basophils Absolute 0.0 0.0 - 0.1 K/uL  Type and screen [place]   Collection Time: 02/04/16  4:36 PM  Result Value Ref Range   ABO/RH(D) B POS    Antibody Screen NEG    Sample Expiration 02/07/2016   CBC   Collection Time: 02/05/16  7:41 AM  Result Value Ref Range   WBC 11.7 (H) 4.0 - 10.5 K/uL   RBC 4.24 3.87 - 5.11 MIL/uL   Hemoglobin 10.6 (L) 12.0 - 15.0 g/dL   HCT 32.3 (L) 36.0 - 46.0 %   MCV 76.2 (L) 78.0 - 100.0 fL   MCH 25.0 (L) 26.0 - 34.0 pg   MCHC 32.8 30.0 - 36.0 g/dL   RDW 16.2 (H) 11.5 - 15.5 %   Platelets 194 150 - 400 K/uL    Treatments: IV hydration  Hospital Course:  This is a 35 y.o. [id number] with  IUP at [redacted]w[redacted]d admitted for pain 2 weeks after ovarian cystectomy.  Pt was observed and is now tolerating a reguar diet. No leaking of fluid and no bleeding. She was observed, fetal heart rate monitoring remained reassuring, and she had no signs/symptoms of progressing pain or other maternal-fetal concerns.  She was on min pain meds and she was deemed stable for discharge to home with outpatient follow up.  Discharge Exam: BP (!) 117/40   Pulse 95   Temp 98.4 F (36.9 C) (Oral)   Resp 20   Ht 5\' 1"  (1.549 m)   Wt 257 lb (116.6 kg)   LMP 09/13/2015   SpO2 99%   BMI 48.56 kg/m  General appearance: alert and no distress GI: soft, non-tender; bowel sounds normal; no masses,  no organomegaly; port sites healing without difficulty.  Discharge Condition: good  Disposition: 01-Home or Self Care  Discharge Instructions    Discharge diet:    Complete by:  As directed    Eat a heart healthy diet   No sexual activity restrictions    Complete by:  As directed    Notify physician for a general feeling that "something is not right"    Complete by:  As directed    Notify  physician for increase or change in vaginal discharge    Complete by:  As directed    Notify physician for intestinal cramps, with or without diarrhea, sometimes described as "gas pain"    Complete by:  As directed    Notify physician for leaking of fluid    Complete by:  As directed    Notify physician for low, dull backache, unrelieved by heat or Tylenol    Complete by:  As directed    Notify physician for menstrual like cramps    Complete by:  As directed    Notify physician for pelvic pressure    Complete by:  As directed    Notify physician for uterine contractions.  These may be painless and feel like the uterus is tightening or the baby is  "balling up"    Complete by:  As directed    Notify physician for vaginal bleeding    Complete by:  As directed    PRETERM LABOR:  Includes any of the follwing symptoms that occur between 20 - [redacted] weeks gestation.  If these symptoms are not stopped, preterm labor can result in preterm delivery, placing your baby at risk    Complete by:  As directed        Medication List    TAKE these medications   aspirin EC 81 MG tablet Take 1 tablet (81 mg total) by mouth daily.   labetalol 100 MG tablet Commonly known as:  NORMODYNE Take 1 tablet (100 mg total) by mouth 2 (two) times daily. What changed:  additional instructions   oxyCODONE-acetaminophen 5-325 MG tablet Commonly known as:  PERCOCET/ROXICET Take 1-2 tablets by mouth every 4 (four) hours as needed for severe pain.   prenatal multivitamin Tabs tablet Take 1 tablet by mouth daily at 12 noon.   promethazine 12.5 MG tablet Commonly known as:  PHENERGAN Take 1 tablet (12.5 mg total) by mouth every 6 (six) hours as needed for nausea or vomiting.     F/u with Dr. Denman George on Nov 15th F/u at [facility]) on Nov 16th   Signed: Lavonia Drafts M.D. 02/05/2016, 11:50 AM

## 2016-02-05 NOTE — Progress Notes (Signed)
Patient ID: Nichole Green, female   DOB: 1980-11-17, 35 y.o.   MRN: [id number] [place]) NOTE  Nichole Green is a 35 y.o. G3P2002 at [redacted]w[redacted]d  who is admitted for abdominal pain.    Fetal presentation is variable. Length of Stay:  1  Days  Date of admission:02/04/2016  Subjective: Patient reports persistent pain this morning although she has been able to rest. The pain is worst with fetal movement and is located in her lower abdomen but worst on right side. She is also complaining of constipation for the past day Patient reports the fetal movement as active. Patient reports uterine contraction  activity as none. Patient reports  vaginal bleeding as none. Patient describes fluid per vagina as None.  Vitals:  Blood pressure (!) 121/38, pulse (!) 102, temperature 98.5 F (36.9 C), temperature source Oral, resp. rate 18, height 5\' 1"  (1.549 m), weight 116.6 kg (257 lb), last menstrual period 09/13/2015, SpO2 99 %. Vitals:   02/04/16 1255 02/04/16 1705 02/04/16 2201 02/05/16 0526  BP: 138/69 (!) 144/64 (!) 122/92 (!) 121/38  Pulse: 104 93 (!) 109 (!) 102  Resp:  16 18 18   Temp: 97.8 F (36.6 C) 99.1 F (37.3 C) 98.9 F (37.2 C) 98.5 F (36.9 C)  TempSrc:  Oral Oral Oral  SpO2:  98%  99%  Weight: 116.6 kg (257 lb) 116.6 kg (257 lb)    Height: 5\' 1"  (1.549 m) 5\' 1"  (1.549 m)     Physical Examination:  General appearance - alert, well appearing, and in no distress Lungs: CTA b/l Heart: RRR Abdomen: soft, tenderness in suprapubic and RLQ region, no rebound, no guarding Extremities: extremities normal, atraumatic, no cyanosis or edema  Membranes:intact  Fetal Monitoring:  Positive heart tones on doppler Labs:  Results for orders placed or performed during the hospital encounter of 02/04/16 (from the past 24 hour(s))  Wet prep, genital   Collection Time: 02/04/16  1:37 PM  Result Value Ref Range   Yeast Wet Prep HPF POC NONE SEEN NONE SEEN   Trich, Wet  Prep NONE SEEN NONE SEEN   Clue Cells Wet Prep HPF POC NONE SEEN NONE SEEN   WBC, Wet Prep HPF POC MODERATE (A) NONE SEEN   Sperm NONE SEEN   Urinalysis, Routine w reflex microscopic (not at [facility])   Collection Time: 02/04/16  2:23 PM  Result Value Ref Range   Color, Urine YELLOW YELLOW   APPearance CLEAR CLEAR   Specific Gravity, Urine <1.005 (L) 1.005 - 1.030   pH 6.0 5.0 - 8.0   Glucose, UA NEGATIVE NEGATIVE mg/dL   Hgb urine dipstick MODERATE (A) NEGATIVE   Bilirubin Urine NEGATIVE NEGATIVE   Ketones, ur NEGATIVE NEGATIVE mg/dL   Protein, ur NEGATIVE NEGATIVE mg/dL   Nitrite NEGATIVE NEGATIVE   Leukocytes, UA TRACE (A) NEGATIVE  Urine microscopic-add on   Collection Time: 02/04/16  2:23 PM  Result Value Ref Range   Squamous Epithelial / LPF 0-5 (A) NONE SEEN   WBC, UA 0-5 0 - 5 WBC/hpf   RBC / HPF 0-5 0 - 5 RBC/hpf   Bacteria, UA RARE (A) NONE SEEN  CBC with Differential/Platelet   Collection Time: 02/04/16  4:36 PM  Result Value Ref Range   WBC 12.0 (H) 4.0 - 10.5 K/uL   RBC 4.76 3.87 - 5.11 MIL/uL   Hemoglobin 12.0 12.0 - 15.0 g/dL   HCT 35.9 (L) 36.0 - 46.0 %   MCV 75.4 (L) 78.0 - 100.0  fL   MCH 25.2 (L) 26.0 - 34.0 pg   MCHC 33.4 30.0 - 36.0 g/dL   RDW 15.9 (H) 11.5 - 15.5 %   Platelets 234 150 - 400 K/uL   Neutrophils Relative % 71 %   Neutro Abs 8.6 (H) 1.7 - 7.7 K/uL   Lymphocytes Relative 23 %   Lymphs Abs 2.7 0.7 - 4.0 K/uL   Monocytes Relative 4 %   Monocytes Absolute 0.4 0.1 - 1.0 K/uL   Eosinophils Relative 2 %   Eosinophils Absolute 0.2 0.0 - 0.7 K/uL   Basophils Relative 0 %   Basophils Absolute 0.0 0.0 - 0.1 K/uL  Type and screen [place]   Collection Time: 02/04/16  4:36 PM  Result Value Ref Range   ABO/RH(D) B POS    Antibody Screen NEG    Sample Expiration 02/07/2016     Imaging Studies:    Currently EPIC will not allow sonographic studies to automatically populate into notes.  In the meantime, copy and paste results  into note or free text.  Medications:  Scheduled . docusate sodium  100 mg Oral Daily  . labetalol  100 mg Oral BID  . prenatal multivitamin  1 tablet Oral Q1200   I have reviewed the patient's current medications.  ASSESSMENT: G3P2002 [redacted]w[redacted]d Estimated Date of Delivery: 06/19/16  Patient Active Problem List   Diagnosis Date Noted  . Preterm uterine contractions 02/04/2016  . Obesity affecting pregnancy, antepartum 12/13/2015  . Supervision of normal pregnancy 12/11/2015  . Chronic hypertension in pregnancy 12/11/2015  . Adnexal mass 11/03/2015  . AMA (advanced maternal age) multigravida 35+ 10/31/2015  . Chronic hypertension during pregnancy, antepartum 10/31/2015    PLAN: 35 yo G3P2 at [redacted]w[redacted]d with abdominal pain s/p right ovarian cystectomy on 10/24 - Follow up ob ultrasound report - Continue pain management prn - Dulcolax provided to help with constipation - Continue current inpatient care  [name] 02/05/2016,7:20 AM

## 2016-02-05 NOTE — Discharge Instructions (Signed)

## 2016-02-05 NOTE — Progress Notes (Signed)
New results reported to Dr. Elly Modena.

## 2016-02-06 LAB — GC/CHLAMYDIA PROBE AMP (~~LOC~~) NOT AT ARMC
CHLAMYDIA, DNA PROBE: NEGATIVE
NEISSERIA GONORRHEA: NEGATIVE

## 2016-02-06 LAB — CULTURE, OB URINE: CULTURE: NO GROWTH

## 2016-02-08 ENCOUNTER — Encounter: Payer: Self-pay | Admitting: Gynecologic Oncology

## 2016-02-08 ENCOUNTER — Ambulatory Visit: Payer: Medicaid Other | Attending: Gynecologic Oncology | Admitting: Gynecologic Oncology

## 2016-02-08 VITALS — BP 110/50 | HR 102 | Temp 98.3°F | Resp 20 | Ht 61.0 in | Wt 278.5 lb

## 2016-02-08 DIAGNOSIS — Z9889 Other specified postprocedural states: Secondary | ICD-10-CM | POA: Insufficient documentation

## 2016-02-08 DIAGNOSIS — N949 Unspecified condition associated with female genital organs and menstrual cycle: Secondary | ICD-10-CM | POA: Diagnosis not present

## 2016-02-08 DIAGNOSIS — R102 Pelvic and perineal pain: Secondary | ICD-10-CM | POA: Diagnosis not present

## 2016-02-08 DIAGNOSIS — O3480 Maternal care for other abnormalities of pelvic organs, unspecified trimester: Secondary | ICD-10-CM | POA: Diagnosis present

## 2016-02-08 DIAGNOSIS — N9489 Other specified conditions associated with female genital organs and menstrual cycle: Secondary | ICD-10-CM

## 2016-02-08 DIAGNOSIS — R1030 Lower abdominal pain, unspecified: Secondary | ICD-10-CM

## 2016-02-08 DIAGNOSIS — N83201 Unspecified ovarian cyst, right side: Secondary | ICD-10-CM | POA: Insufficient documentation

## 2016-02-08 NOTE — Progress Notes (Signed)
POSTOPERATIVE CHECK  Assessment:    35 y.o. year old with a benign right ovarian cyst in pregnancy.   S/p robotic right ovarian cystectomy on 01/17/16.   Plan: 1) Pathology reports reviewed today 2) Treatment counseling - I discussed the benign nature of her cyst. No additional restrictions are required. She was given the opportunity to ask questions, which were answered to her satisfaction, and she is agreement with the above mentioned plan of care.  3)  Return to clinic on a prn basis 4) abdominal pain: given the nature and location of her pain I do not believe it is directly related to her surgery. I wonder if it is related to pubic symphysitis. I recommended she try a pelvic belt.  HPI:  Nichole Green is a 35 y.o. year old G3P2002 initially seen in consultation on 12/28/15 referred by Dr Ihor Dow for a 6cm right ovarian cyst in pregnancy.  She then underwent a robotic assisted right ovarian cystectomy on [id number] without complications.  Her postoperative course was uncomplicated.  Her final pathology revealed a benign serous cystadnofibroma. A benign paratubal cyst was also removed..  She is seen today for a postoperative check and to discuss her pathology results and ongoing plan.  Since discharge from the hospital, she was initially feeling very well for 1 week and had complete resolution of symptoms. However in the 2nd week postop she began feeling bilateraly low pelvic pains only relieved by opioids. She was seen at [facility] and admitted for observation. She was noted to have no evidence of PTL or ruptured membranes. She denies bleeding.    Review of systems: Constitutional:  She has no weight gain or weight loss. She has no fever or chills. Eyes: No blurred vision Ears, Nose, Mouth, Throat: No dizziness, headaches or changes in hearing. No mouth sores. Cardiovascular: No chest pain, palpitations or edema. Respiratory:  No shortness of breath, wheezing or  cough Gastrointestinal: She has normal bowel movements without diarrhea or constipation. She denies any nausea or vomiting. She denies blood in her stool or heart burn. Genitourinary:  She denies pelvic pain, pelvic pressure or changes in her urinary function. She has no hematuria, dysuria, or incontinence. She has no irregular vaginal bleeding or vaginal discharge Musculoskeletal: Denies muscle weakness or joint pains.  Skin:  She has no skin changes, rashes or itching Neurological:  Denies dizziness or headaches. No neuropathy, no numbness or tingling. Psychiatric:  She denies depression or anxiety. Hematologic/Lymphatic:   No easy bruising or bleeding   Physical Exam: Blood pressure (!) 110/50, pulse (!) 102, temperature 98.3 F (36.8 C), temperature source Oral, resp. rate 20, height 5\' 1"  (1.549 m), weight 278 lb 8 oz (126.3 kg), last menstrual period 09/13/2015, SpO2 99 %. General: Well dressed, well nourished in no apparent distress.   HEENT:  Normocephalic and atraumatic, no lesions.  Extraocular muscles intact. Sclerae anicteric. Pupils equal, round, reactive. No mouth sores or ulcers. Thyroid is normal size, not nodular, midline. Skin:  No lesions or rashes. Abdomen:  Soft, nontender, nondistended.  No palpable masses.  No hepatosplenomegaly.  No ascites. Normal bowel sounds.  No hernias.  Incisions are well healed. Gravid abdomen. No abdominal pain on palpation. Genitourinary:deferred Extremities: No cyanosis, clubbing or edema.  No calf tenderness or erythema. No palpable cords. Psychiatric: Mood and affect are appropriate. Neurological: Awake, alert and oriented x 3. Sensation is intact, no neuropathy.  Musculoskeletal: Slight tenderness on deep palpation of pubic symphysis.   Donaciano Eva, MD

## 2016-02-08 NOTE — Patient Instructions (Signed)
No follow up is required. Additional questions or concerns please call. Thank you

## 2016-02-09 ENCOUNTER — Ambulatory Visit (INDEPENDENT_AMBULATORY_CARE_PROVIDER_SITE_OTHER): Payer: Medicaid Other | Admitting: Certified Nurse Midwife

## 2016-02-09 VITALS — BP 116/83 | HR 97 | Temp 97.8°F | Wt 257.0 lb

## 2016-02-09 DIAGNOSIS — O0990 Supervision of high risk pregnancy, unspecified, unspecified trimester: Secondary | ICD-10-CM

## 2016-02-09 DIAGNOSIS — Z8742 Personal history of other diseases of the female genital tract: Secondary | ICD-10-CM

## 2016-02-09 DIAGNOSIS — O10912 Unspecified pre-existing hypertension complicating pregnancy, second trimester: Secondary | ICD-10-CM

## 2016-02-09 DIAGNOSIS — O3412 Maternal care for benign tumor of corpus uteri, second trimester: Secondary | ICD-10-CM

## 2016-02-09 DIAGNOSIS — Z9889 Other specified postprocedural states: Principal | ICD-10-CM

## 2016-02-09 DIAGNOSIS — D259 Leiomyoma of uterus, unspecified: Secondary | ICD-10-CM

## 2016-02-09 MED ORDER — CYCLOBENZAPRINE HCL 10 MG PO TABS: 10.0000 mg | ORAL_TABLET | Freq: Three times a day (TID) | ORAL | 1 refills | Status: DC | PRN

## 2016-02-09 NOTE — Progress Notes (Signed)
Subjective:    Nichole Green is a 35 y.o. female being seen today for her obstetrical visit. She is at [redacted]w[redacted]d gestation. Patient reports: no complaints . Fetal movement: normal.  Problem List Items Addressed This Visit      Genitourinary   Uterine fibroid     Other   Supervision of high risk pregnancy, antepartum     Patient Active Problem List   Diagnosis Date Noted  . Uterine fibroid 02/09/2016  . Post-operative pain 02/05/2016  . Abdominal pain affecting pregnancy   . Preterm uterine contractions 02/04/2016  . Obesity affecting pregnancy, antepartum 12/13/2015  . Supervision of high risk pregnancy, antepartum 12/11/2015  . Chronic hypertension in pregnancy 12/11/2015  . AMA (advanced maternal age) multigravida 35+ 10/31/2015  . Chronic hypertension during pregnancy, antepartum 10/31/2015   Objective:    BP 116/83   Pulse 97   Temp 97.8 F (36.6 C)   Wt 257 lb (116.6 kg)   LMP 09/13/2015   BMI 48.56 kg/m  FHT: 150 BPM  Uterine Size: 22 cm and size equals dates     Assessment:    Pregnancy @ [redacted]w[redacted]d    Ovarian cystectomy 01/17/16: benign  AMA  CHTN: not on labetalol; b/p normotensive  Plan:   Rx: abdominal support belt  OBGCT: discussed. Signs and symptoms of preterm labor: discussed and handout given.  Labs, problem list reviewed and updated 2 hr GTT planned Follow up in 4 weeks.

## 2016-02-09 NOTE — Patient Instructions (Addendum)
Hypertension During Pregnancy Hypertension is also called high blood pressure. High blood pressure means that the force of your blood moving in your body is too strong. When you are pregnant, this condition should be watched carefully. It can cause problems for you and your baby. Follow these instructions at home: Eating and drinking  Drink enough fluid to keep your pee (urine) clear or pale yellow.  Eat healthy foods that are low in salt (sodium).  Do not add salt to your food.  Check labels on foods and drinks to see much salt is in them. Look on the label where you see "Sodium." Lifestyle  Do not use any products that contain nicotine or tobacco, such as cigarettes and e-cigarettes. If you need help quitting, ask your doctor.  Do not use alcohol.  Avoid caffeine.  Avoid stress. Rest and get plenty of sleep. General instructions  Take over-the-counter and prescription medicines only as told by your doctor.  While lying down, lie on your left side. This keeps pressure off your baby.  While sitting or lying down, raise (elevate) your feet. Try putting some pillows under your lower legs.  Exercise regularly. Ask your doctor what kinds of exercise are best for you.  Keep all prenatal and follow-up visits as told by your doctor. This is important. Contact a doctor if:  You have symptoms that your doctor told you to watch for, such as:  Fever.  Throwing up (vomiting).  Headache. Get help right away if:  You have very bad pain in your belly (abdomen).  You are throwing up, and this does not get better with treatment.  You suddenly get swelling in your hands, ankles, or face.  You gain 4 lb (1.8 kg) or more in 1 week.  You get bleeding from your vagina.  You have blood in your pee.  You do not feel your baby moving as much as normal.  You have a change in vision.  You have muscle twitching or sudden tightening (spasms).  You have trouble breathing.  Your lips  or fingernails turn blue. This information is not intended to replace advice given to you by your health care provider. Make sure you discuss any questions you have with your health care provider. Document Released: 04/14/2010 Document Revised: 11/22/2015 Document Reviewed: 11/22/2015 Elsevier Interactive Patient Education  2017 [place] of Pregnancy The second trimester is from week 13 through week 28, month 4 through 6. This is often the time in pregnancy that you feel your best. Often times, morning sickness has lessened or quit. You may have more energy, and you may get hungry more often. Your unborn baby (fetus) is growing rapidly. At the end of the sixth month, he or she is about 9 inches long and weighs about 1 pounds. You will likely feel the baby move (quickening) between 18 and 20 weeks of pregnancy. Follow these instructions at home:  Avoid all smoking, herbs, and alcohol. Avoid drugs not approved by your doctor.  Do not use any tobacco products, including cigarettes, chewing tobacco, and electronic cigarettes. If you need help quitting, ask your doctor. You may get counseling or other support to help you quit.  Only take medicine as told by your doctor. Some medicines are safe and some are not during pregnancy.  Exercise only as told by your doctor. Stop exercising if you start having cramps.  Eat regular, healthy meals.  Wear a good support bra if your breasts are tender.  Do  not use hot tubs, steam rooms, or saunas.  Wear your seat belt when driving.  Avoid raw meat, uncooked cheese, and liter boxes and soil used by cats.  Take your prenatal vitamins.  Take 1500-2000 milligrams of calcium daily starting at the 20th week of pregnancy until you deliver your baby.  Try taking medicine that helps you poop (stool softener) as needed, and if your doctor approves. Eat more fiber by eating fresh fruit, vegetables, and whole grains. Drink enough fluids to  keep your pee (urine) clear or pale yellow.  Take warm water baths (sitz baths) to soothe pain or discomfort caused by hemorrhoids. Use hemorrhoid cream if your doctor approves.  If you have puffy, bulging veins (varicose veins), wear support hose. Raise (elevate) your feet for 15 minutes, 3-4 times a day. Limit salt in your diet.  Avoid heavy lifting, wear low heals, and sit up straight.  Rest with your legs raised if you have leg cramps or low back pain.  Visit your dentist if you have not gone during your pregnancy. Use a soft toothbrush to brush your teeth. Be gentle when you floss.  You can have sex (intercourse) unless your doctor tells you not to.  Go to your doctor visits. Get help if:  You feel dizzy.  You have mild cramps or pressure in your lower belly (abdomen).  You have a nagging pain in your belly area.  You continue to feel sick to your stomach (nauseous), throw up (vomit), or have watery poop (diarrhea).  You have bad smelling fluid coming from your vagina.  You have pain with peeing (urination). Get help right away if:  You have a fever.  You are leaking fluid from your vagina.  You have spotting or bleeding from your vagina.  You have severe belly cramping or pain.  You lose or gain weight rapidly.  You have trouble catching your breath and have chest pain.  You notice sudden or extreme puffiness (swelling) of your face, hands, ankles, feet, or legs.  You have not felt the baby move in over an hour.  You have severe headaches that do not go away with medicine.  You have vision changes. This information is not intended to replace advice given to you by your health care provider. Make sure you discuss any questions you have with your health care provider. Document Released: 06/06/2009 Document Revised: 08/18/2015 Document Reviewed: 05/13/2012 Elsevier Interactive Patient Education  2017 [employer].  Preterm Labor and Birth  Information Pregnancy normally lasts 39-41 weeks. Preterm labor is when labor starts early. It starts before you have been pregnant for 37 whole weeks. What are the risk factors for preterm labor? Preterm labor is more likely to occur in women who:  Have an infection while pregnant.  Have a cervix that is short.  Have gone into preterm labor before.  Have had surgery on their cervix.  Are younger than age 88.  Are older than age 55.  Are African American.  Are pregnant with two or more babies.  Take street drugs while pregnant.  Smoke while pregnant.  Do not gain enough weight while pregnant.  Got pregnant right after another pregnancy. What are the symptoms of preterm labor? Symptoms of preterm labor include:  Cramps. The cramps may feel like the cramps some women get during their period. The cramps may happen with watery poop (diarrhea).  Pain in the belly (abdomen).  Pain in the lower back.  Regular contractions or tightening. It may  feel like your belly is getting tighter.  Pressure in the lower belly that seems to get stronger.  More fluid (discharge) leaking from the vagina. The fluid may be watery or bloody.  Water breaking. Why is it important to notice signs of preterm labor? Babies who are born early may not be fully developed. They have a higher chance for:  Long-term heart problems.  Long-term lung problems.  Trouble controlling body systems, like breathing.  Bleeding in the brain.  A condition called cerebral palsy.  Learning difficulties.  Death. These risks are highest for babies who are born before 53 weeks of pregnancy. How is preterm labor treated? Treatment depends on:  How long you were pregnant.  Your condition.  The health of your baby. Treatment may involve:  Having a stitch (suture) placed in your cervix. When you give birth, your cervix opens so the baby can come out. The stitch keeps the cervix from opening too  soon.  Staying at the hospital.  Taking or getting medicines, such as:  Hormone medicines.  Medicines to stop contractions.  Medicines to help the baby's lungs develop.  Medicines to prevent your baby from having cerebral palsy. What should I do if I am in preterm labor? If you think you are going into labor too soon, call your doctor right away. How can I prevent preterm labor?  Do not use any tobacco products.  Examples of these are cigarettes, chewing tobacco, and e-cigarettes.  If you need help quitting, ask your doctor.  Do not use street drugs.  Do not use any medicines unless you ask your doctor if they are safe for you.  Talk with your doctor before taking any herbal supplements.  Make sure you gain enough weight.  Watch for infection. If you think you might have an infection, get it checked right away.  If you have gone into preterm labor before, tell your doctor. This information is not intended to replace advice given to you by your health care provider. Make sure you discuss any questions you have with your health care provider. Document Released: 06/08/2008 Document Revised: 08/23/2015 Document Reviewed: 08/03/2015 Elsevier Interactive Patient Education  2017 [employer].

## 2016-02-21 ENCOUNTER — Encounter (HOSPITAL_COMMUNITY): Payer: Self-pay

## 2016-02-21 ENCOUNTER — Ambulatory Visit (HOSPITAL_COMMUNITY)
Admission: RE | Admit: 2016-02-21 | Discharge: 2016-02-21 | Disposition: A | Discharge status: Home / Self Care | Payer: Medicaid Other | Source: Ambulatory Visit | Attending: Obstetrics | Admitting: Obstetrics

## 2016-02-21 DIAGNOSIS — O10012 Pre-existing essential hypertension complicating pregnancy, second trimester: Secondary | ICD-10-CM | POA: Insufficient documentation

## 2016-02-21 DIAGNOSIS — O09522 Supervision of elderly multigravida, second trimester: Secondary | ICD-10-CM

## 2016-02-21 DIAGNOSIS — O0990 Supervision of high risk pregnancy, unspecified, unspecified trimester: Secondary | ICD-10-CM

## 2016-02-21 DIAGNOSIS — O10919 Unspecified pre-existing hypertension complicating pregnancy, unspecified trimester: Secondary | ICD-10-CM

## 2016-02-21 DIAGNOSIS — G8918 Other acute postprocedural pain: Secondary | ICD-10-CM

## 2016-02-21 DIAGNOSIS — O99212 Obesity complicating pregnancy, second trimester: Secondary | ICD-10-CM | POA: Diagnosis not present

## 2016-02-21 DIAGNOSIS — O3482 Maternal care for other abnormalities of pelvic organs, second trimester: Secondary | ICD-10-CM | POA: Insufficient documentation

## 2016-02-21 DIAGNOSIS — Z3A23 23 weeks gestation of pregnancy: Secondary | ICD-10-CM | POA: Diagnosis present

## 2016-02-21 DIAGNOSIS — O99210 Obesity complicating pregnancy, unspecified trimester: Secondary | ICD-10-CM

## 2016-02-21 DIAGNOSIS — O10912 Unspecified pre-existing hypertension complicating pregnancy, second trimester: Secondary | ICD-10-CM | POA: Diagnosis present

## 2016-02-21 DIAGNOSIS — Z362 Encounter for other antenatal screening follow-up: Secondary | ICD-10-CM | POA: Diagnosis not present

## 2016-02-21 IMAGING — US US MFM OB FOLLOW-UP
1 series · 14 of 28 positions shown · non-contrast
Comparison: none

[Series 1: us mfm ob follow-up · 54 acquisitions, 14 frames shown]
[im 2/54]
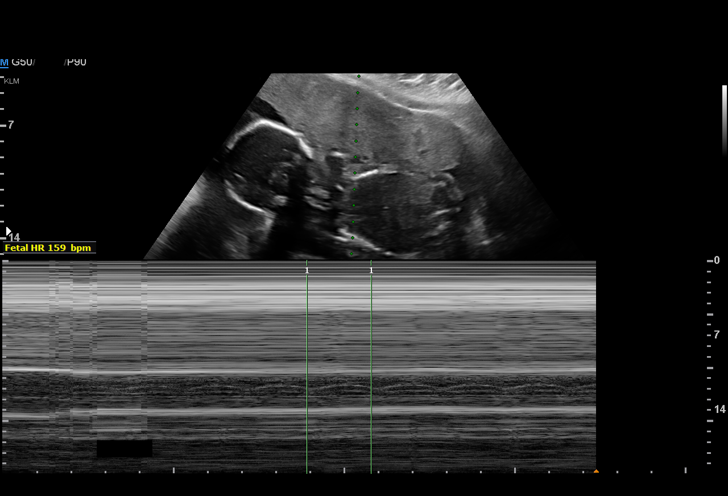
[im 6/54]
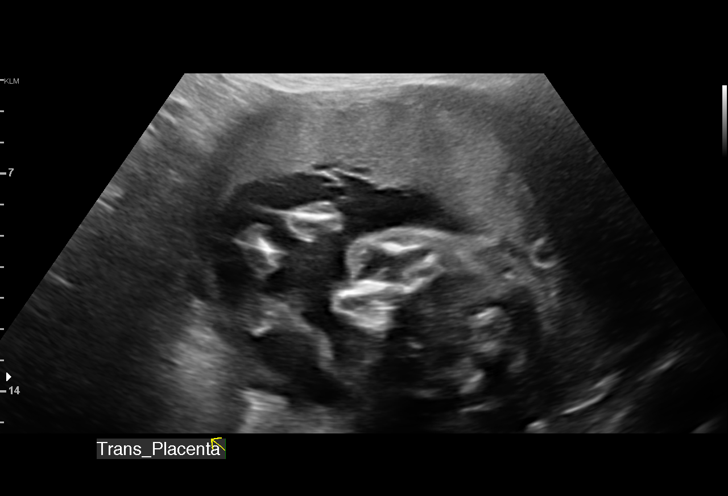
[im 10/54]
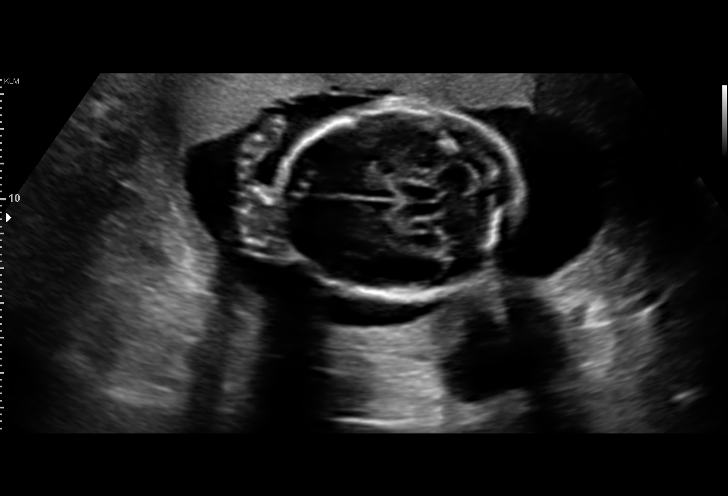
[im 14/54]
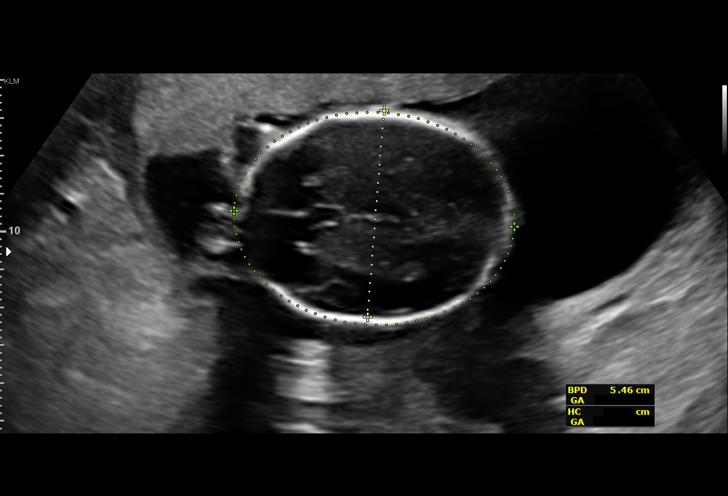
[im 18/54]
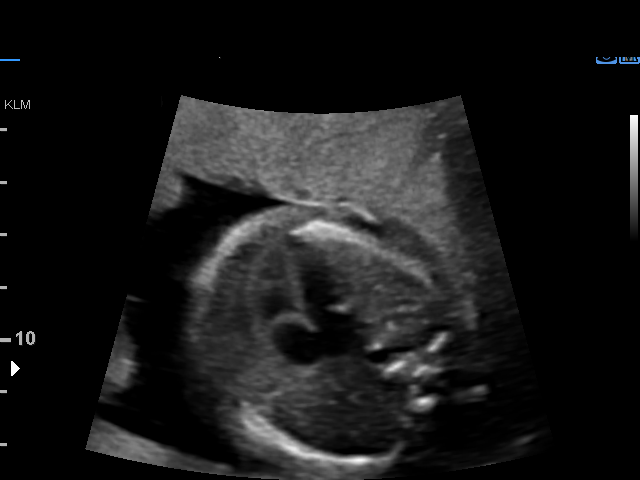
[im 22/54]
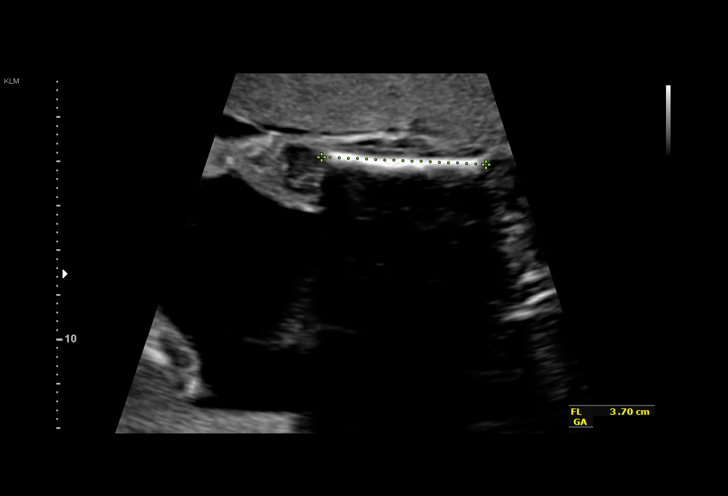
[im 26/54]
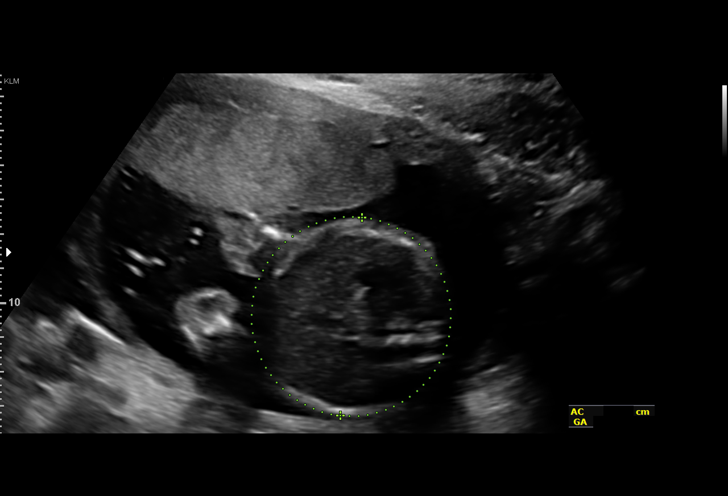
[im 30/54]
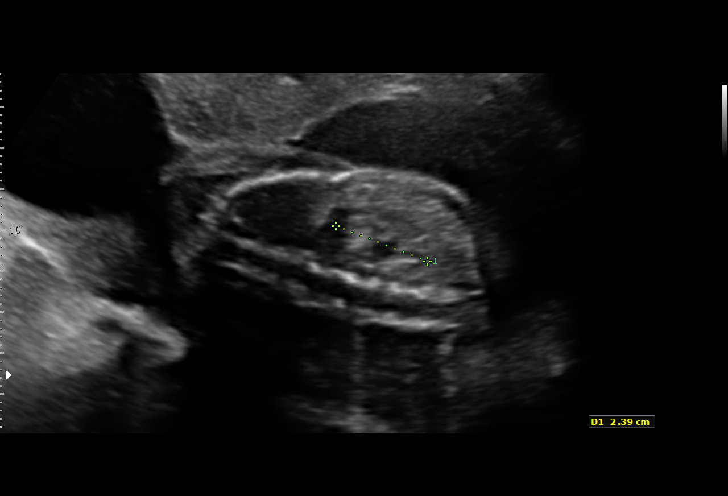
[im 34/54]
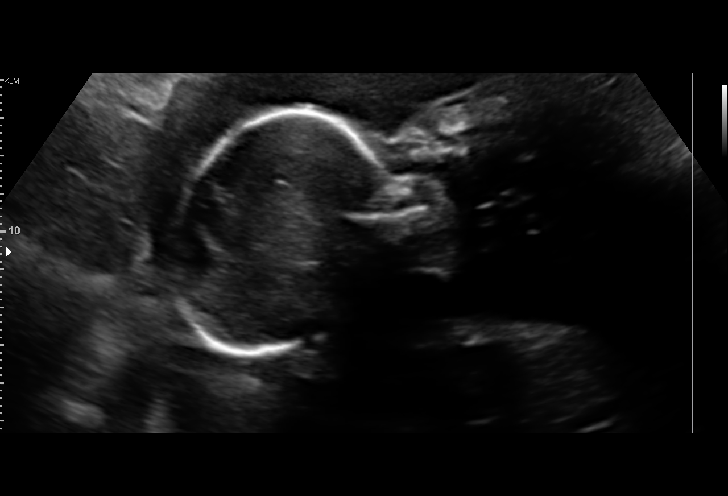
[im 38/54]
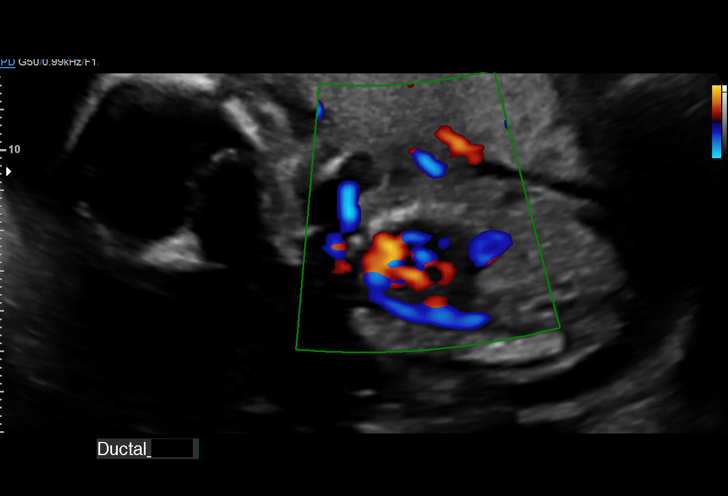
[im 42/54]
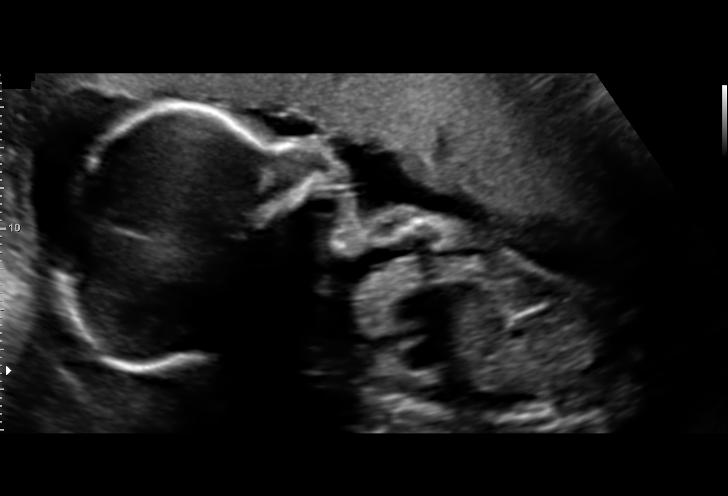
[im 46/54]
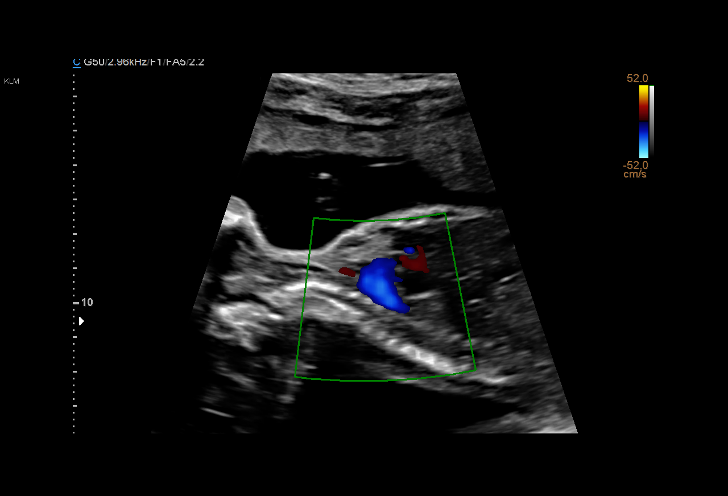
[im 50/54]
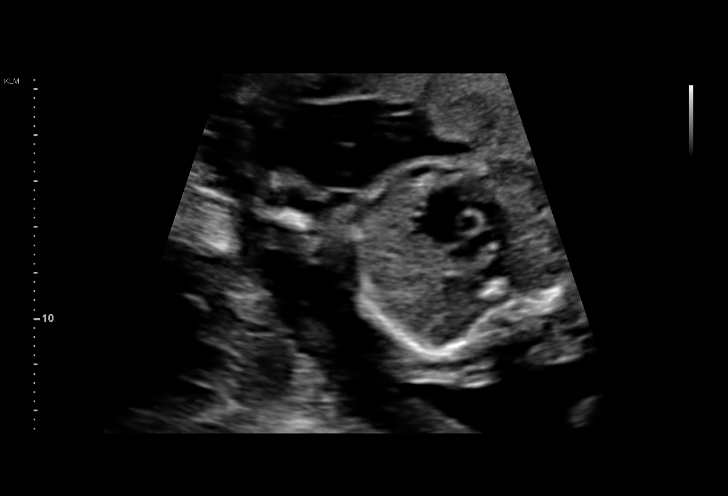
[im 54/54]
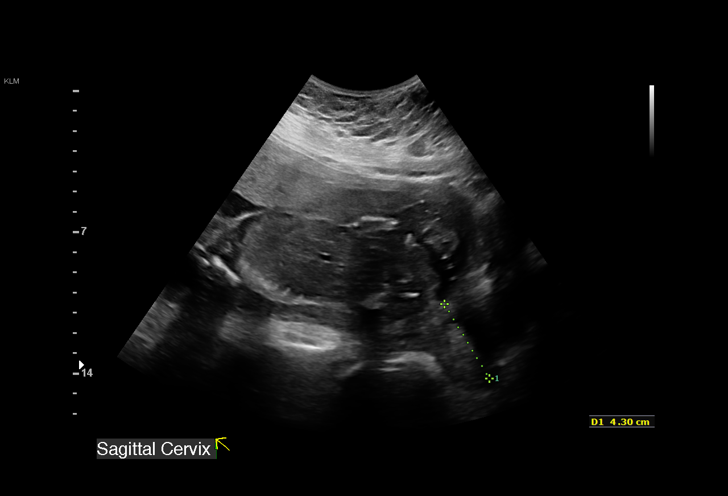

[14 of 28 positions shown; findings below may reference images not displayed]

Road [HOSPITAL]

1  ERXLEBEN            [PHONE_NUMBER]      [PHONE_NUMBER]     [PHONE_NUMBER]
Indications

23 weeks gestation of pregnancy
Advanced maternal age multigravida 35+,        [5S]
second trimester (had low risk NIPS, neg
quad screen)
Ovarian cyst complicating pregnancy            [5S]
(removed [DATE]); benign
Hypertension - Chronic/Pre-existing;           [5S]
labetalol and ASA
Obesity complicating pregnancy, second         [5S]
trimester
Antenatal follow-up for nonvisualized fetal    [5S]
anatomy
OB History

Blood Type:            Height:  5'3"   Weight (lb):  254      BMI:
Gravidity:    3         Term:   2        Prem:   0        SAB:   0
TOP:          0       Ectopic:  0        Living: 2
Fetal Evaluation

Num Of Fetuses:     1
Fetal Heart         159
Rate(bpm):
Cardiac Activity:   Observed
Presentation:       Frank breech
Placenta:           Anterior, above cervical os
P. Cord Insertion:  Visualized

Amniotic Fluid
AFI FV:      Subjectively within normal limits
Largest Pocket(cm)
5.8
Biometry

BPD:      54.6  mm     G. Age:  22w 4d         31  %    CI:         69.2   %   70 - 86
FL/HC:      18.3   %   19.2 -
HC:      209.6  mm     G. Age:  23w 0d         37  %    HC/AC:      1.14       1.05 -
AC:      183.7  mm     G. Age:  23w 1d         48  %    FL/BPD:     70.1   %   71 - 87
FL:       38.3  mm     G. Age:  22w 2d         18  %    FL/AC:      20.8   %   20 - 24

Est. FW:     535  gm      1 lb 3 oz     51  %
Gestational Age

LMP:           23w 0d       Date:   [DATE]                 EDD:   [DATE]
U/S Today:     22w 5d                                        EDD:   [DATE]
Best:          23w 0d    Det. By:   LMP  ([DATE])          EDD:   [DATE]
Anatomy

Cranium:               Appears normal         Aortic Arch:            Appears normal
Cavum:                 Appears normal         Ductal Arch:            Appears normal
Ventricles:            Appears normal         Diaphragm:              Appears normal
Choroid Plexus:        Previously seen        Stomach:                Appears normal, left
sided
Cerebellum:            Appears normal         Abdomen:                Appears normal
Posterior Fossa:       Appears normal         Abdominal Wall:         Not well visualized
Nuchal Fold:           Previously seen        Cord Vessels:           Previously seen
Face:                  Profile nl; orbits     Kidneys:                Appear normal
previously seen
Lips:                  Previously seen        Bladder:                Appears normal
Thoracic:              Appears normal         Spine:                  Previously seen
Heart:                 Appears normal         Upper Extremities:      Previously seen
(4CH, axis, and situs
RVOT:                  Appears normal         Lower Extremities:      Previously seen
LVOT:                  Appears normal

Other:  Fetus appears to be a male. Technically difficult due to maternal
habitus.
Cervix Uterus Adnexa

Cervix
Normal appearance by transabdominal scan.
Myomas

Site                     L(cm)      W(cm)      D(cm)      Location
Posterior

Blood Flow                 RI        PI       Comments
Previously seen
Impression

Singleton intrauterine pregnancy at 23+0 weeks with AMA
and chronic hypertension
Review of the anatomy shows no sonographic markers for
aneuploidy or structural anomalies
However, evaluations should be considered suboptimal
secondary to maternal body habitus
Amniotic fluid volume is normal
Estimated fetal weight is 535 g which is growth in the 51st
percentile
Recommendations

Recheck growth in 4 weeks. will need weekly BPP or twice-
weekly NSTs from 30-32 weeks until delivery

## 2016-02-23 ENCOUNTER — Other Ambulatory Visit (HOSPITAL_COMMUNITY): Payer: Self-pay | Admitting: *Deleted

## 2016-02-23 DIAGNOSIS — O10919 Unspecified pre-existing hypertension complicating pregnancy, unspecified trimester: Secondary | ICD-10-CM

## 2016-02-27 ENCOUNTER — Telehealth: Payer: Self-pay | Admitting: Gynecologic Oncology

## 2016-02-27 NOTE — Telephone Encounter (Signed)
Called patient.  She is reporting a fever and cough at this time.  Advised her to contact her OB GYN.  Her surgery with [place] was in Oct and was unremarkable.  She states she contacted them and they told her to call [facility].  No concerns voiced.

## 2016-03-06 ENCOUNTER — Encounter: Payer: Self-pay | Admitting: *Deleted

## 2016-03-08 ENCOUNTER — Ambulatory Visit (INDEPENDENT_AMBULATORY_CARE_PROVIDER_SITE_OTHER): Payer: Medicaid Other | Admitting: Certified Nurse Midwife

## 2016-03-08 ENCOUNTER — Encounter: Payer: Self-pay | Admitting: Certified Nurse Midwife

## 2016-03-08 VITALS — BP 127/69 | HR 105 | Temp 96.8°F

## 2016-03-08 DIAGNOSIS — O4700 False labor before 37 completed weeks of gestation, unspecified trimester: Secondary | ICD-10-CM

## 2016-03-08 DIAGNOSIS — E669 Obesity, unspecified: Secondary | ICD-10-CM

## 2016-03-08 DIAGNOSIS — O99210 Obesity complicating pregnancy, unspecified trimester: Secondary | ICD-10-CM

## 2016-03-08 DIAGNOSIS — O479 False labor, unspecified: Secondary | ICD-10-CM

## 2016-03-08 DIAGNOSIS — O3412 Maternal care for benign tumor of corpus uteri, second trimester: Secondary | ICD-10-CM

## 2016-03-08 DIAGNOSIS — O10919 Unspecified pre-existing hypertension complicating pregnancy, unspecified trimester: Secondary | ICD-10-CM

## 2016-03-08 DIAGNOSIS — O10912 Unspecified pre-existing hypertension complicating pregnancy, second trimester: Secondary | ICD-10-CM

## 2016-03-08 DIAGNOSIS — O4702 False labor before 37 completed weeks of gestation, second trimester: Secondary | ICD-10-CM

## 2016-03-08 DIAGNOSIS — O0990 Supervision of high risk pregnancy, unspecified, unspecified trimester: Secondary | ICD-10-CM

## 2016-03-08 DIAGNOSIS — J020 Streptococcal pharyngitis: Secondary | ICD-10-CM | POA: Diagnosis not present

## 2016-03-08 DIAGNOSIS — O09522 Supervision of elderly multigravida, second trimester: Secondary | ICD-10-CM

## 2016-03-08 DIAGNOSIS — D259 Leiomyoma of uterus, unspecified: Secondary | ICD-10-CM

## 2016-03-08 DIAGNOSIS — O99212 Obesity complicating pregnancy, second trimester: Secondary | ICD-10-CM

## 2016-03-08 LAB — POCT URINALYSIS DIPSTICK
Bilirubin, UA: NEGATIVE
GLUCOSE UA: NEGATIVE
Ketones, UA: NEGATIVE
Leukocytes, UA: NEGATIVE
NITRITE UA: NEGATIVE
PH UA: 6
Spec Grav, UA: >=1.03
UROBILINOGEN UA: NEGATIVE

## 2016-03-08 LAB — POCT RAPID STREP A (OFFICE): RAPID STREP A SCREEN: POSITIVE — AB

## 2016-03-08 MED ORDER — AMOXICILLIN-POT CLAVULANATE 875-125 MG PO TABS: 1.0000 | ORAL_TABLET | Freq: Two times a day (BID) | ORAL | 0 refills | Status: DC

## 2016-03-08 NOTE — Progress Notes (Signed)
Subjective:    Nichole Green is a 35 y.o. female being seen today for her obstetrical visit. She is at [redacted]w[redacted]d gestation. Patient reports: no complaints . Fetal movement: normal.  Problem List Items Addressed This Visit      Cardiovascular and Mediastinum   Chronic hypertension during pregnancy, antepartum     Genitourinary   Uterine fibroid     Other   AMA (advanced maternal age) multigravida 35+   Supervision of high risk pregnancy, antepartum   Relevant Orders   Influenza a and b   ToxASSURE Select 33 (MW), Urine   POCT rapid strep A (Completed)   POCT urinalysis dipstick (Completed)   Obesity affecting pregnancy, antepartum   Preterm uterine contractions    Other Visit Diagnoses    Strep pharyngitis    -  Primary   Relevant Medications   amoxicillin-clavulanate (AUGMENTIN) 875-125 MG tablet   Other Relevant Orders   POCT rapid strep A (Completed)     Patient Active Problem List   Diagnosis Date Noted  . Uterine fibroid 02/09/2016  . Post-operative pain 02/05/2016  . Abdominal pain affecting pregnancy   . Preterm uterine contractions 02/04/2016  . Obesity affecting pregnancy, antepartum 12/13/2015  . Supervision of high risk pregnancy, antepartum 12/11/2015  . Chronic hypertension in pregnancy 12/11/2015  . AMA (advanced maternal age) multigravida 35+ 10/31/2015  . Chronic hypertension during pregnancy, antepartum 10/31/2015   Objective:    BP 127/69   Pulse (!) 105   Temp (!) 96.8 F (36 C)   LMP 09/13/2015  FHT: 155 BPM  Uterine Size: 25 cm and size equals dates     Assessment:    Pregnancy @ [redacted]w[redacted]d    Plan:    OBGCT: discussed and ordered for next visit. Signs and symptoms of preterm labor: discussed and handout given.  Labs, problem list reviewed and updated 2 hr GTT planned Follow up in 4 weeks.

## 2016-03-08 NOTE — Progress Notes (Signed)
Pt has cold like symptoms, cough-suffy nose. Pt states symptoms x 1 month. Pt state she was advised to have our office check for UTI. Pt states that she has had some spotting.

## 2016-03-08 NOTE — Addendum Note (Signed)
Addended by: Lewie Loron D on: 03/08/2016 03:22 PM   Modules accepted: Orders

## 2016-03-09 LAB — INFLUENZA A AND B
INFLUENZA A AG, EIA: NEGATIVE
INFLUENZA B AG, EIA: NEGATIVE

## 2016-03-09 LAB — PLEASE NOTE:

## 2016-03-11 LAB — CULTURE, OB URINE

## 2016-03-11 LAB — URINE CULTURE, OB REFLEX

## 2016-03-15 LAB — TOXASSURE SELECT 13 (MW), URINE

## 2016-03-23 ENCOUNTER — Ambulatory Visit (HOSPITAL_COMMUNITY): Payer: Medicaid Other

## 2016-03-26 NOTE — L&D Delivery Note (Addendum)
Delivery Note At 12:41 AM a viable female was delivered via Vaginal, Spontaneous Delivery (Presentation:LOA) over intact perineum.  APGAR: 7, 9.   Placenta status:delivered whole and intact with 3-vessel cord.  Anesthesia:  Epidural  Episiotomy: None Lacerations: None Suture Repair: n/a Est. Blood Loss (mL): 50 (Addended by E. Yides Saidi, DO due to reported EBL)  Mom to postpartum.  Baby to Couplet care / Skin to Skin.  Peggy Constant 05/22/2016, 12:51 AM

## 2016-03-29 ENCOUNTER — Ambulatory Visit (HOSPITAL_COMMUNITY)
Admission: RE | Admit: 2016-03-29 | Discharge: 2016-03-29 | Disposition: A | Discharge status: Home / Self Care | Payer: Medicaid Other | Source: Ambulatory Visit | Attending: Obstetrics | Admitting: Obstetrics

## 2016-03-29 ENCOUNTER — Other Ambulatory Visit (HOSPITAL_COMMUNITY): Payer: Self-pay | Admitting: Obstetrics and Gynecology

## 2016-03-29 ENCOUNTER — Encounter (HOSPITAL_COMMUNITY): Payer: Self-pay

## 2016-03-29 DIAGNOSIS — O09523 Supervision of elderly multigravida, third trimester: Secondary | ICD-10-CM

## 2016-03-29 DIAGNOSIS — Z362 Encounter for other antenatal screening follow-up: Secondary | ICD-10-CM | POA: Insufficient documentation

## 2016-03-29 DIAGNOSIS — O0990 Supervision of high risk pregnancy, unspecified, unspecified trimester: Secondary | ICD-10-CM

## 2016-03-29 DIAGNOSIS — Z3A28 28 weeks gestation of pregnancy: Secondary | ICD-10-CM | POA: Diagnosis not present

## 2016-03-29 DIAGNOSIS — O10919 Unspecified pre-existing hypertension complicating pregnancy, unspecified trimester: Secondary | ICD-10-CM

## 2016-03-29 DIAGNOSIS — O09522 Supervision of elderly multigravida, second trimester: Secondary | ICD-10-CM

## 2016-03-29 DIAGNOSIS — O10013 Pre-existing essential hypertension complicating pregnancy, third trimester: Secondary | ICD-10-CM | POA: Diagnosis present

## 2016-03-29 DIAGNOSIS — O99213 Obesity complicating pregnancy, third trimester: Secondary | ICD-10-CM | POA: Insufficient documentation

## 2016-03-29 DIAGNOSIS — O99210 Obesity complicating pregnancy, unspecified trimester: Secondary | ICD-10-CM

## 2016-03-29 DIAGNOSIS — G8918 Other acute postprocedural pain: Secondary | ICD-10-CM

## 2016-03-29 IMAGING — US US MFM FETAL BPP W/O NON-STRESS
1 series · 13 of 28 positions shown · non-contrast
Comparison: none

[Series 1: us mfm fetal bpp w/o non-stress · 13 of 68 slices shown]
[im 3/68]
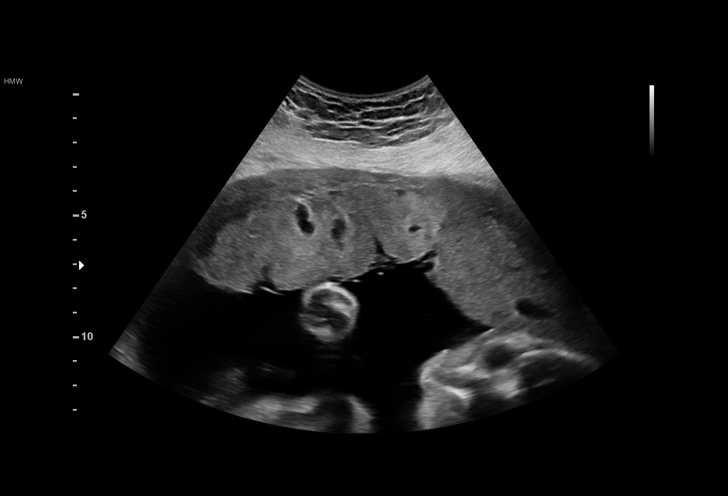
[im 8/68]
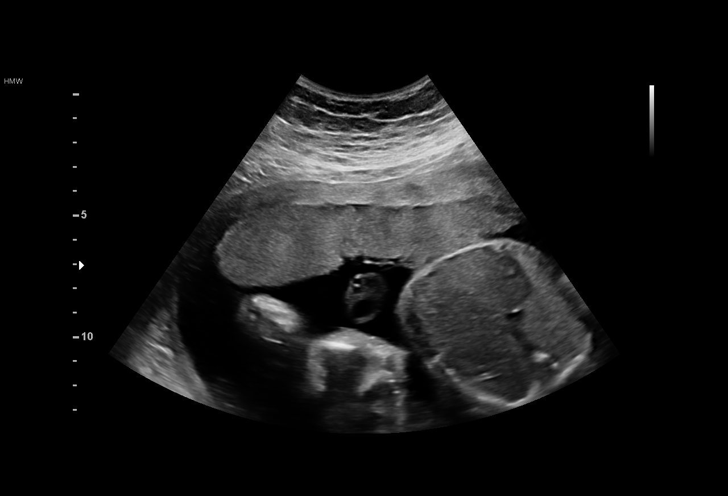
[im 13/68]
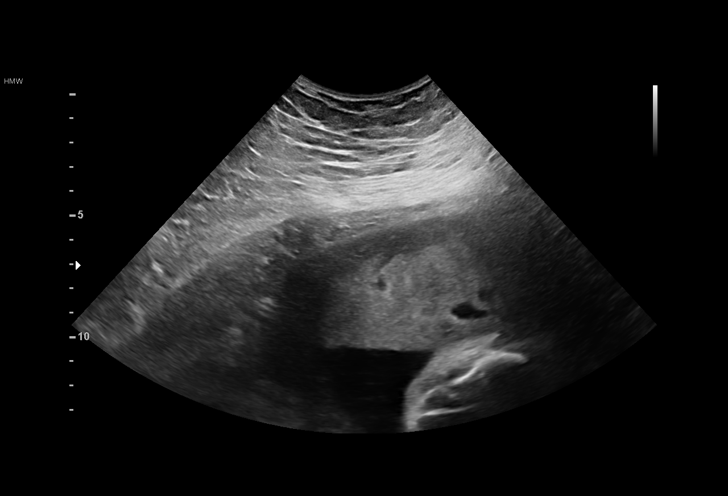
[im 18/68]
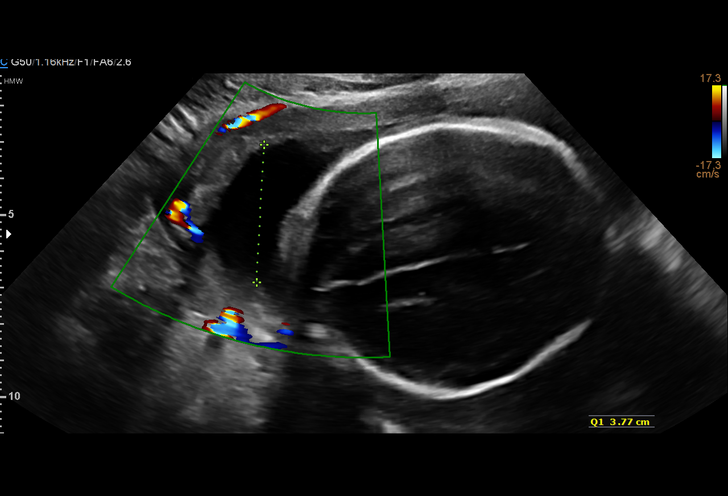
[im 23/68]
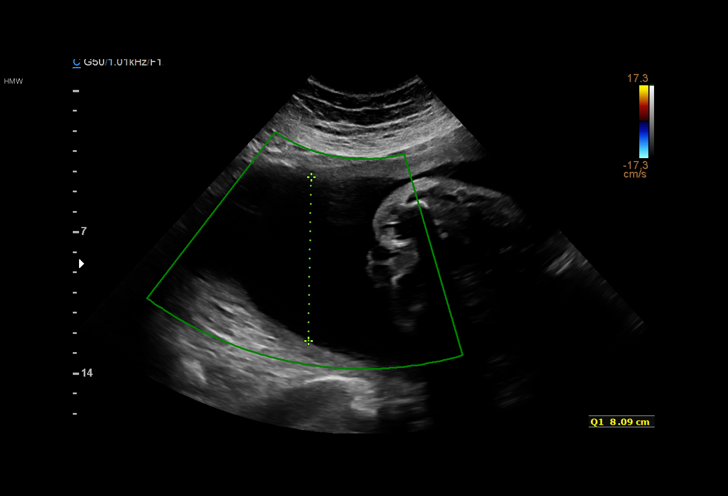
[im 28/68]
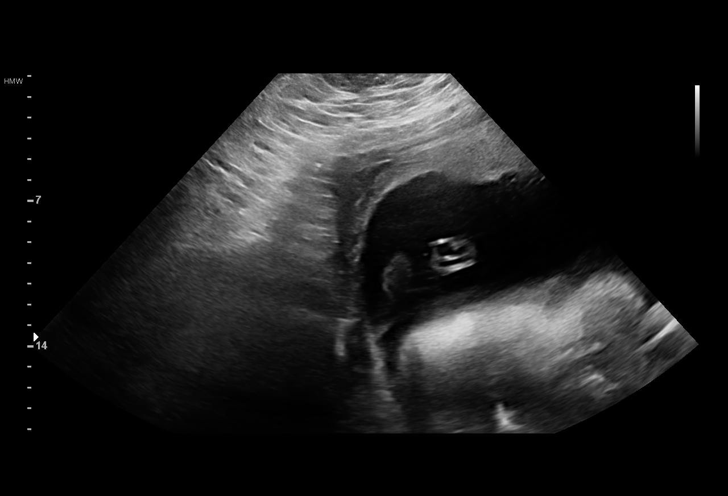
[im 35/68]
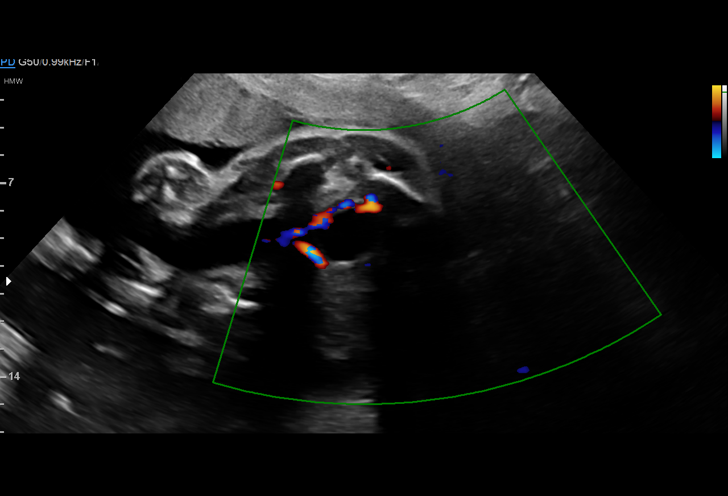
[im 40/68]
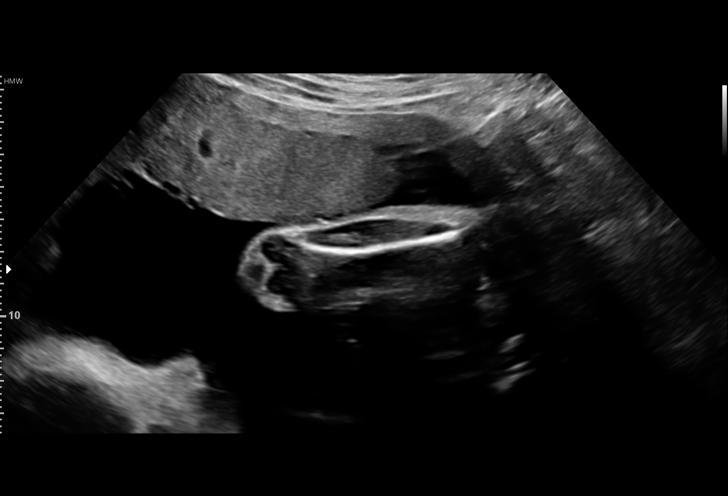
[im 45/68]
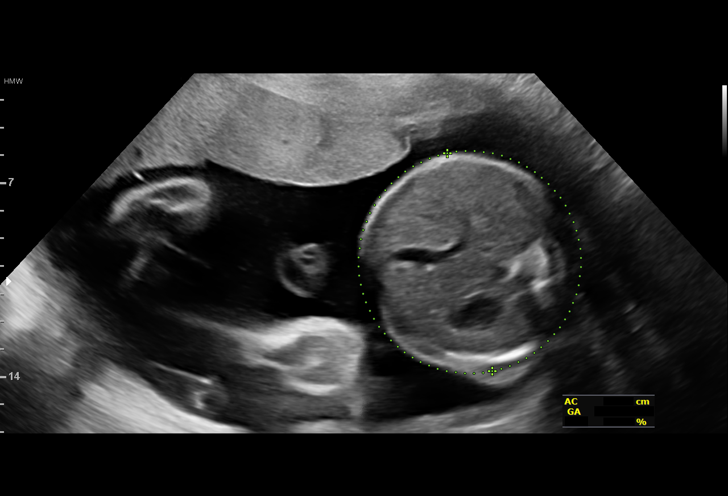
[im 50/68]
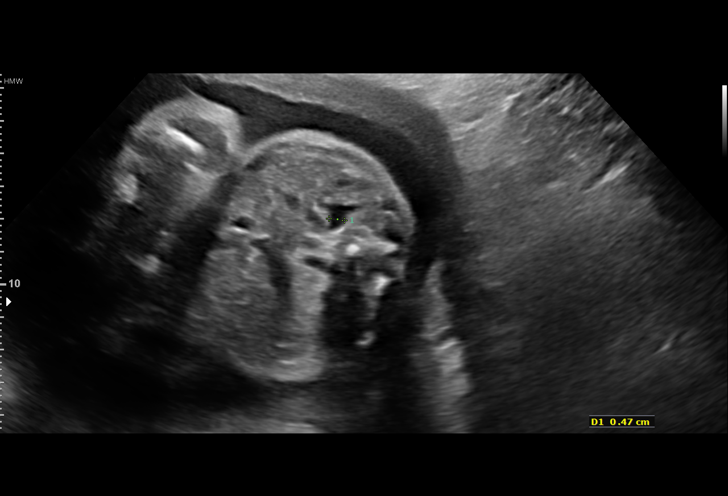
[im 55/68]
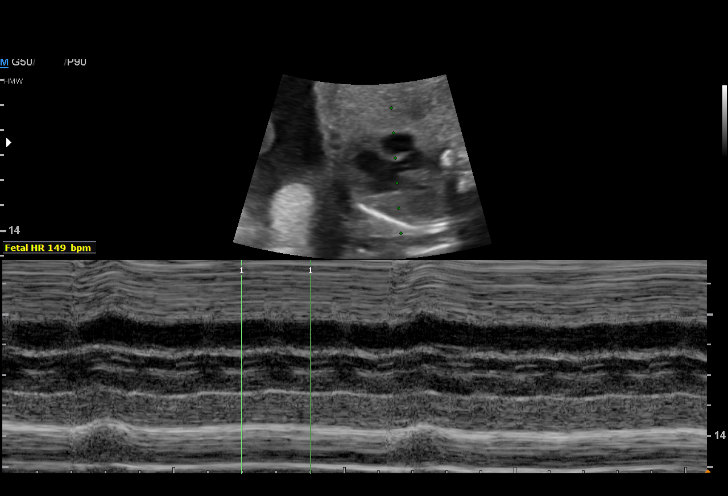
[im 60/68]
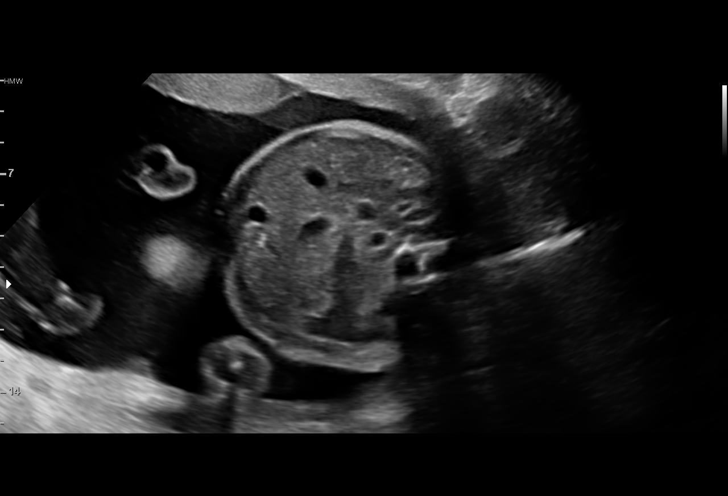
[im 65/68]
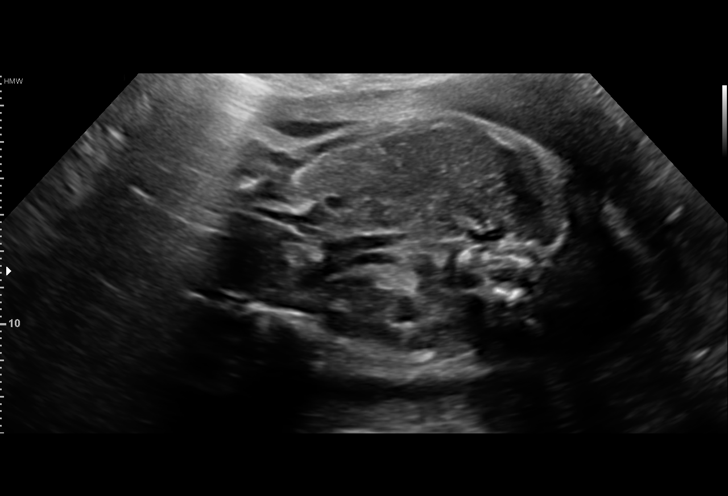

[13 of 28 positions shown; findings below may reference images not displayed]

Road [HOSPITAL]
Attending:        JORDI         Secondary Phy.:   [facility]-
MAU/Triage

1  JORDI              [PHONE_NUMBER]      [PHONE_NUMBER]     [PHONE_NUMBER]
2  JORDI              [PHONE_NUMBER]      [PHONE_NUMBER]     [PHONE_NUMBER]
Indications

28 weeks gestation of pregnancy
Ovarian cyst complicating pregnancy            [KA]
(removed [DATE]); benign
Hypertension - Chronic/Pre-existing;           [KA]
labetalol and ASA
Obesity complicating pregnancy, second         [KA]
trimester
Antenatal follow-up for nonvisualized fetal    [KA]
anatomy
Advanced maternal age multigravida 35+,        [KA]
third trimester(low risk NIPS, neg quad
screen)
OB History

Blood Type:            Height:  5'3"   Weight (lb):  254      BMI:
Gravidity:    3         Term:   2        Prem:   0        SAB:   0
TOP:          0       Ectopic:  0        Living: 2
Fetal Evaluation

Num Of Fetuses:     1
Fetal Heart         149
Rate(bpm):
Cardiac Activity:   Observed
Presentation:       Cephalic
Placenta:           Anterior, above cervical os
P. Cord Insertion:  Previously Visualized
Amniotic Fluid
AFI FV:      Polyhydramnios

AFI Sum(cm)     %Tile       Largest Pocket(cm)
24.69           97

RUQ(cm)       RLQ(cm)       LUQ(cm)        LLQ(cm)
8.09
Biophysical Evaluation

Amniotic F.V:   Within normal limits       F. Tone:        Observed
F. Movement:    Observed                   Score:          [DATE]
F. Breathing:   Not Observed
Biometry

BPD:      71.7  mm     G. Age:  28w 5d         54  %    CI:        74.39   %   70 - 86
FL/HC:      19.4   %   18.8 -
HC:      263.9  mm     G. Age:  28w 5d         32  %    HC/AC:      1.05       1.05 -
AC:      251.5  mm     G. Age:  29w 2d         75  %    FL/BPD:     71.5   %   71 - 87
FL:       51.3  mm     G. Age:  27w 3d         15  %    FL/AC:      20.4   %   20 - 24
HUM:      46.3  mm     G. Age:  27w 2d         24  %

Est. FW:    [KA]  gm    2 lb 12 oz      57  %
Gestational Age

LMP:           28w 2d       Date:   [DATE]                 EDD:   [DATE]
U/S Today:     28w 4d                                        EDD:   [DATE]
Best:          28w 2d    Det. By:   LMP  ([DATE])          EDD:   [DATE]
Anatomy

Cranium:               Appears normal         Aortic Arch:            Previously seen
Cavum:                 Previously seen        Ductal Arch:            Previously seen
Ventricles:            Appears normal         Diaphragm:              Previously seen
Choroid Plexus:        Previously seen        Stomach:                Appears normal, left
sided
Cerebellum:            Previously seen        Abdomen:                Previously seen
Posterior Fossa:       Previously seen        Abdominal Wall:         Appears nml (cord
insert, abd wall)
Nuchal Fold:           Previously seen        Cord Vessels:           Previously seen
Face:                  Orbits and profile     Kidneys:                Appear normal
previously seen
Lips:                  Previously seen        Bladder:                Appears normal
Thoracic:              Appears normal         Spine:                  Previously seen
Heart:                 Appears normal         Upper Extremities:      Previously seen
(4CH, axis, and situs
RVOT:                  Appears normal         Lower Extremities:      Previously seen
LVOT:                  Appears normal

Other:  Fetus appears to be a male. Technically difficult due to maternal
habitus.
Cervix Uterus Adnexa

Cervix
Not adaquately visualized
Uterus
No abnormality visualized.

Left Ovary
No adnexal mass visualized.

Right Ovary
No adnexal mass visualized.

Cul De Sac:   No free fluid seen.

Adnexa:       No abnormality visualized.
Impression

Single living intrauterine pregnancy at [KA].
Appropriate interval fetal growth (57%).
Mild polyhydramnios (AFI 24.7).
Normal interval fetal anatomy.
BPP [DATE] (absent fetal breathing).
Recommendations

The result's of today's ultrasound were discussed with the
patient. The patient has previously had a low risk cfDNA and
Quad screen. She has not yet undergone screening for
gestational diabetes. Given a new finding of mild
polyhydramnios I recommend weekly BPPs and continued
serial growth ultrasounds.

## 2016-03-30 ENCOUNTER — Other Ambulatory Visit (HOSPITAL_COMMUNITY): Payer: Self-pay | Admitting: *Deleted

## 2016-03-30 DIAGNOSIS — O409XX0 Polyhydramnios, unspecified trimester, not applicable or unspecified: Secondary | ICD-10-CM

## 2016-04-03 ENCOUNTER — Other Ambulatory Visit: Payer: Medicaid Other

## 2016-04-03 ENCOUNTER — Ambulatory Visit (INDEPENDENT_AMBULATORY_CARE_PROVIDER_SITE_OTHER): Payer: Medicaid Other | Admitting: Certified Nurse Midwife

## 2016-04-03 VITALS — BP 121/78 | HR 101 | Temp 98.2°F | Wt 263.6 lb

## 2016-04-03 DIAGNOSIS — O99210 Obesity complicating pregnancy, unspecified trimester: Secondary | ICD-10-CM

## 2016-04-03 DIAGNOSIS — O09523 Supervision of elderly multigravida, third trimester: Secondary | ICD-10-CM

## 2016-04-03 DIAGNOSIS — E669 Obesity, unspecified: Secondary | ICD-10-CM

## 2016-04-03 DIAGNOSIS — O10913 Unspecified pre-existing hypertension complicating pregnancy, third trimester: Secondary | ICD-10-CM

## 2016-04-03 DIAGNOSIS — O10919 Unspecified pre-existing hypertension complicating pregnancy, unspecified trimester: Secondary | ICD-10-CM

## 2016-04-03 DIAGNOSIS — O0990 Supervision of high risk pregnancy, unspecified, unspecified trimester: Secondary | ICD-10-CM

## 2016-04-03 DIAGNOSIS — O403XX0 Polyhydramnios, third trimester, not applicable or unspecified: Secondary | ICD-10-CM | POA: Insufficient documentation

## 2016-04-03 DIAGNOSIS — O99213 Obesity complicating pregnancy, third trimester: Secondary | ICD-10-CM

## 2016-04-03 MED ORDER — TETANUS-DIPHTH-ACELL PERTUSSIS 5-2.5-18.5 LF-MCG/0.5 IM SUSP
0.5000 mL | Freq: Once | INTRAMUSCULAR | Status: AC
  Administered 2016-04-03: 0.5 mL via INTRAMUSCULAR

## 2016-04-03 NOTE — Progress Notes (Signed)
   PRENATAL VISIT NOTE  Subjective:  Nichole Green is a 36 y.o. G3P2002 at [redacted]w[redacted]d being seen today for ongoing prenatal care.  She is currently monitored for the following issues for this high-risk pregnancy and has AMA (advanced maternal age) multigravida 71+; Chronic hypertension during pregnancy, antepartum; Supervision of high risk pregnancy, antepartum; Chronic hypertension in pregnancy; Obesity affecting pregnancy, antepartum; Preterm uterine contractions; Post-operative pain; Abdominal pain affecting pregnancy; Uterine fibroid; and Polyhydramnios affecting pregnancy in third trimester on her problem list.  Patient reports no complaints.  Contractions: Irritability. Vag. Bleeding: None.  Movement: Present. Denies leaking of fluid.   The following portions of the patient's history were reviewed and updated as appropriate: allergies, current medications, past family history, past medical history, past social history, past surgical history and problem list. Problem list updated.  Objective:   Vitals:   04/03/16 0848  BP: 121/78  Pulse: (!) 101  Temp: 98.2 F (36.8 C)  Weight: 263 lb 9.6 oz (119.6 kg)    Fetal Status: Fetal Heart Rate (bpm): 148 Fundal Height: 33 cm Movement: Present     General:  Alert, oriented and cooperative. Patient is in no acute distress.  Skin: Skin is warm and dry. No rash noted.   Cardiovascular: Normal heart rate noted  Respiratory: Normal respiratory effort, no problems with respiration noted  Abdomen: Soft, gravid, appropriate for gestational age. Pain/Pressure: Present     Pelvic:  Cervical exam deferred        Extremities: Normal range of motion.  Edema: None  Mental Status: Normal mood and affect. Normal behavior. Normal judgment and thought content.   Assessment and Plan:  Pregnancy: G3P2002 at [redacted]w[redacted]d  1. Supervision of high risk pregnancy, antepartum NST for CHTN and polyhydramnios @32  weeks  2. Chronic hypertension during pregnancy,  antepartum On baby ASA, [place] scheduled for 04/05/16: BPP  3. Elderly multigravida in third trimester NST @32  weeks Has missed a few appointments  4. Obesity affecting pregnancy, antepartum [place] reviewed from 03/29/16:polyhydramnios: EFW: 57% 2 hour OGTT today.    5. Polyhydramnios Weekly BPPs scheduled with MFM\24.69 cm.   Discussed with Dr. Mariane Masters: no change in POC. Continue weekly BPPs.   6. Acute URI  OTC Mucinex, benadryl   Preterm labor symptoms and general obstetric precautions including but not limited to vaginal bleeding, contractions, leaking of fluid and fetal movement were reviewed in detail with the patient. Please refer to After Visit Summary for other counseling recommendations.  No Follow-up on file.   Morene Crocker, CNM

## 2016-04-04 LAB — CBC
HEMOGLOBIN: 11.2 g/dL (ref 11.1–15.9)
Hematocrit: 35.3 % (ref 34.0–46.6)
MCH: 24.8 pg — ABNORMAL LOW (ref 26.6–33.0)
MCHC: 31.7 g/dL (ref 31.5–35.7)
MCV: 78 fL — ABNORMAL LOW (ref 79–97)
Platelets: 235 10*3/uL (ref 150–379)
RBC: 4.51 x10E6/uL (ref 3.77–5.28)
RDW: 17 % — AB (ref 12.3–15.4)
WBC: 13.2 10*3/uL — AB (ref 3.4–10.8)

## 2016-04-04 LAB — RPR: RPR: NONREACTIVE

## 2016-04-04 LAB — GLUCOSE TOLERANCE, 2 HOURS W/ 1HR
GLUCOSE, 1 HOUR: 204 mg/dL — AB (ref 65–179)
GLUCOSE, 2 HOUR: 114 mg/dL (ref 65–152)
Glucose, Fasting: 105 mg/dL — ABNORMAL HIGH (ref 65–91)

## 2016-04-04 LAB — HIV ANTIBODY (ROUTINE TESTING W REFLEX): HIV SCREEN 4TH GENERATION: NONREACTIVE

## 2016-04-05 ENCOUNTER — Ambulatory Visit (HOSPITAL_COMMUNITY)
Admission: RE | Admit: 2016-04-05 | Discharge: 2016-04-05 | Disposition: A | Discharge status: Home / Self Care | Payer: Medicaid Other | Source: Ambulatory Visit | Attending: Obstetrics and Gynecology | Admitting: Obstetrics and Gynecology

## 2016-04-05 ENCOUNTER — Encounter (HOSPITAL_COMMUNITY): Payer: Self-pay

## 2016-04-05 DIAGNOSIS — O0990 Supervision of high risk pregnancy, unspecified, unspecified trimester: Secondary | ICD-10-CM

## 2016-04-05 DIAGNOSIS — O09523 Supervision of elderly multigravida, third trimester: Secondary | ICD-10-CM | POA: Diagnosis not present

## 2016-04-05 DIAGNOSIS — O409XX0 Polyhydramnios, unspecified trimester, not applicable or unspecified: Secondary | ICD-10-CM

## 2016-04-05 DIAGNOSIS — O99210 Obesity complicating pregnancy, unspecified trimester: Secondary | ICD-10-CM

## 2016-04-05 DIAGNOSIS — O10013 Pre-existing essential hypertension complicating pregnancy, third trimester: Secondary | ICD-10-CM | POA: Insufficient documentation

## 2016-04-05 DIAGNOSIS — O403XX0 Polyhydramnios, third trimester, not applicable or unspecified: Secondary | ICD-10-CM | POA: Insufficient documentation

## 2016-04-05 DIAGNOSIS — O3483 Maternal care for other abnormalities of pelvic organs, third trimester: Secondary | ICD-10-CM | POA: Diagnosis not present

## 2016-04-05 DIAGNOSIS — G8918 Other acute postprocedural pain: Secondary | ICD-10-CM

## 2016-04-05 DIAGNOSIS — Z3A29 29 weeks gestation of pregnancy: Secondary | ICD-10-CM | POA: Insufficient documentation

## 2016-04-05 DIAGNOSIS — O10919 Unspecified pre-existing hypertension complicating pregnancy, unspecified trimester: Secondary | ICD-10-CM

## 2016-04-05 IMAGING — US US MFM FETAL BPP W/O NON-STRESS
1 series · 15 of 22 positions shown · non-contrast
Comparison: none

[Series 1: us mfm fetal bpp w/o non-stress · 22 acquisitions, 15 frames shown]
[im 1/22]
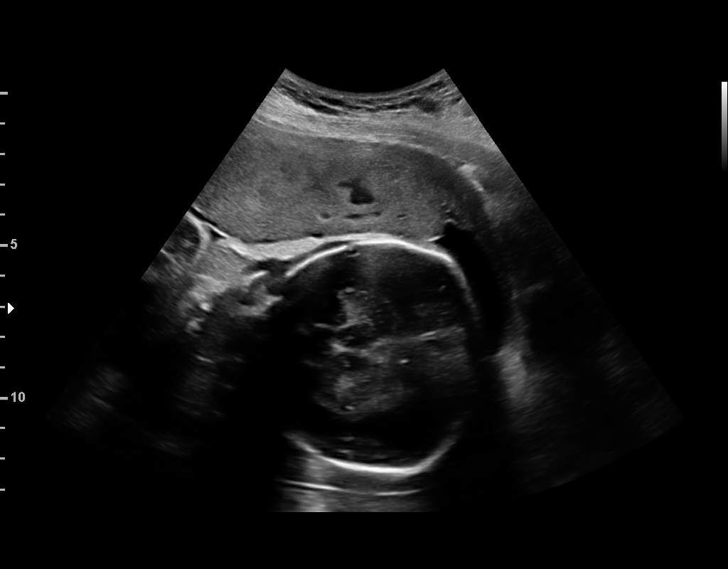
[im 3/22]
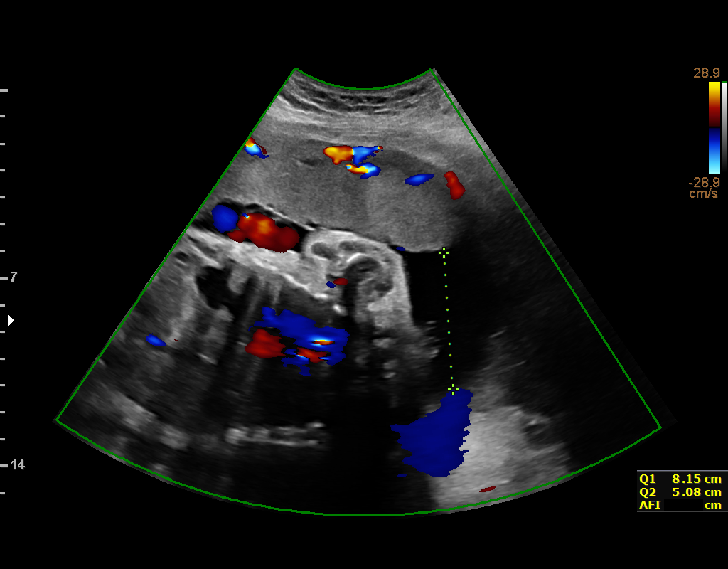
[im 4/22]
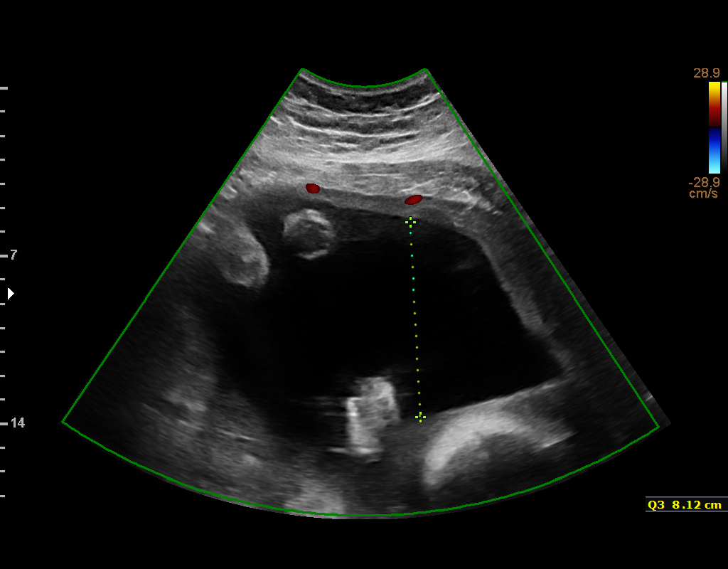
[im 6/22]
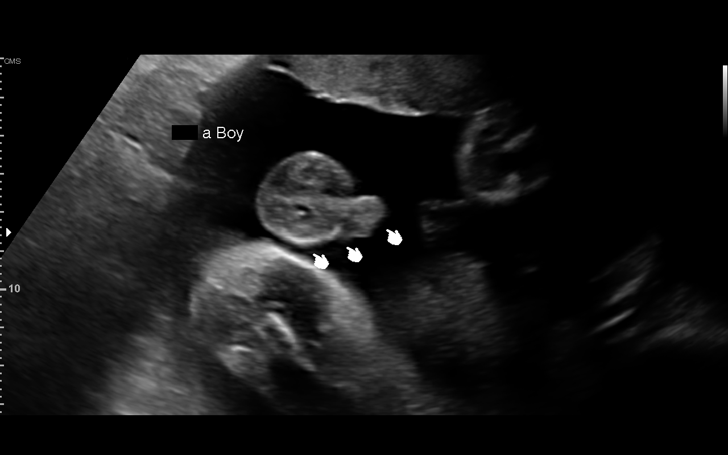
[im 7/22]
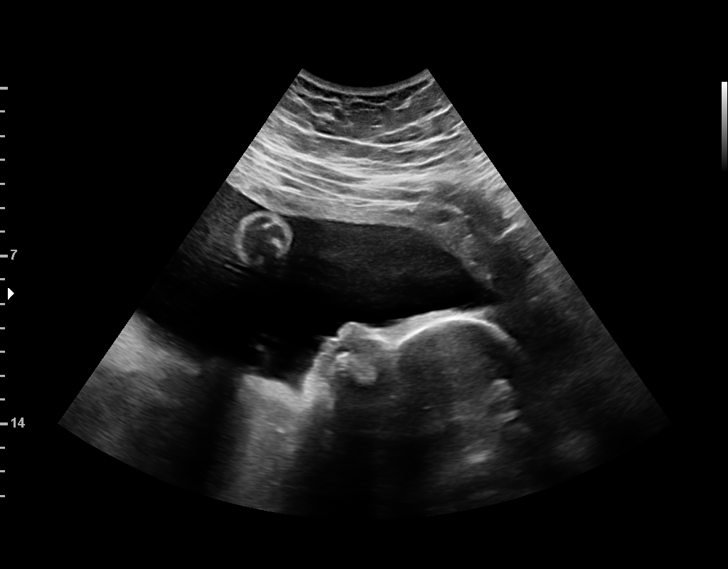
[im 9/22]
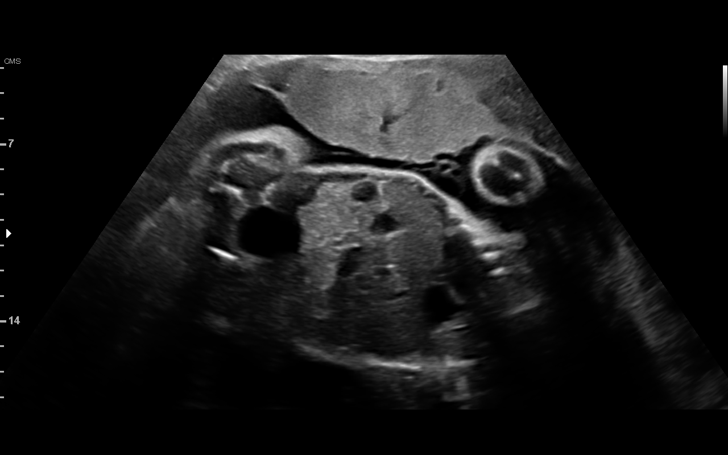
[im 10/22]
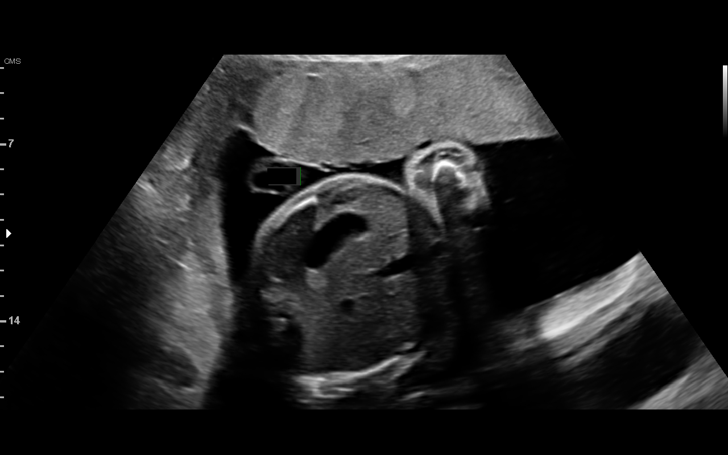
[im 12/22]
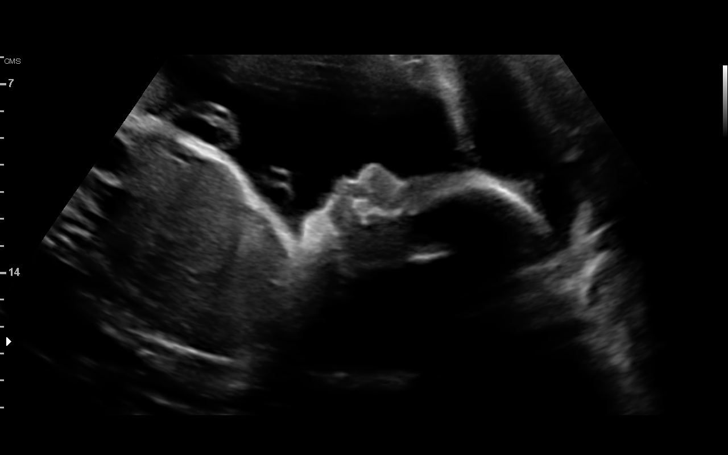
[im 13/22]
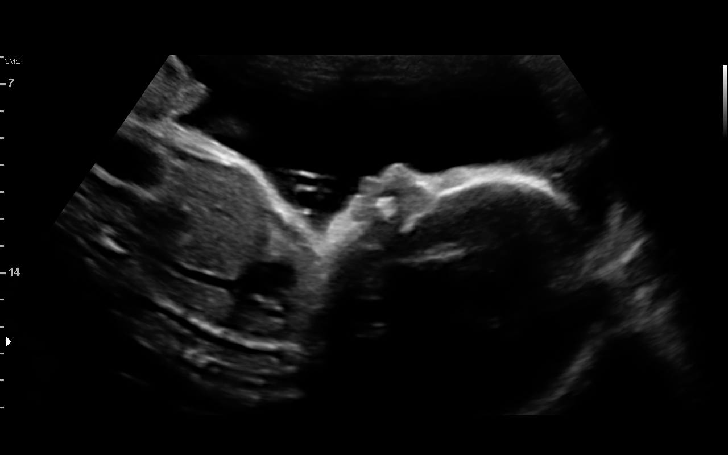
[im 14/22]
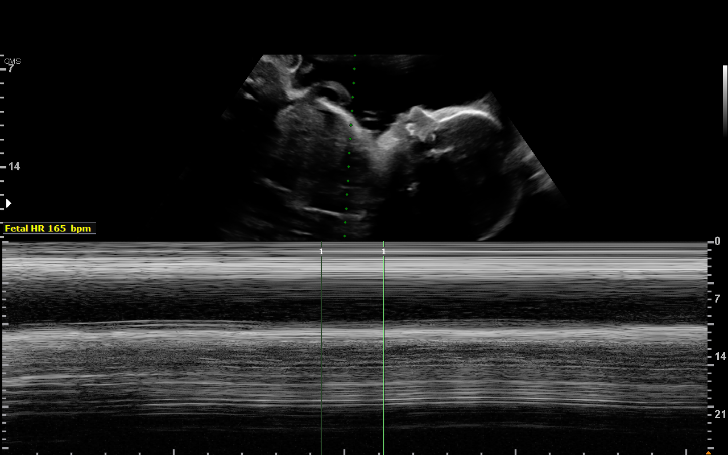
[im 16/22]
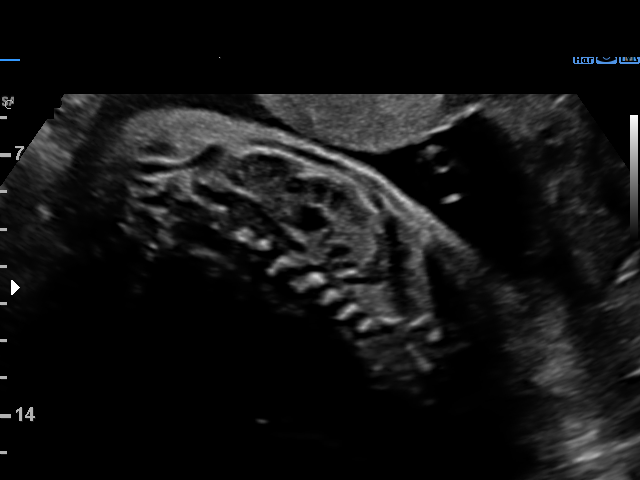
[im 17/22]
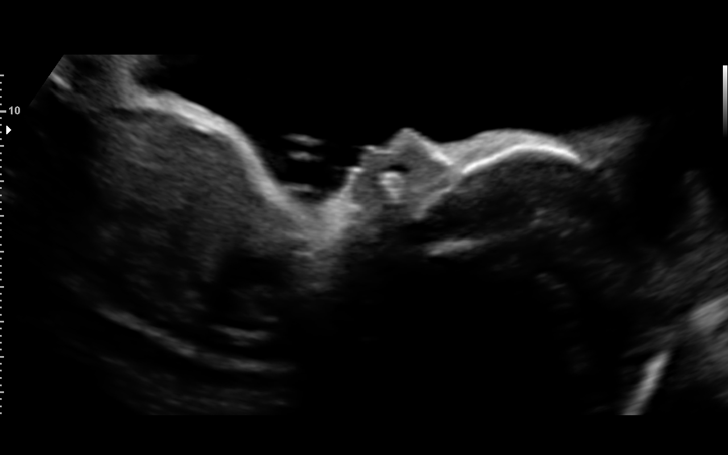
[im 19/22]
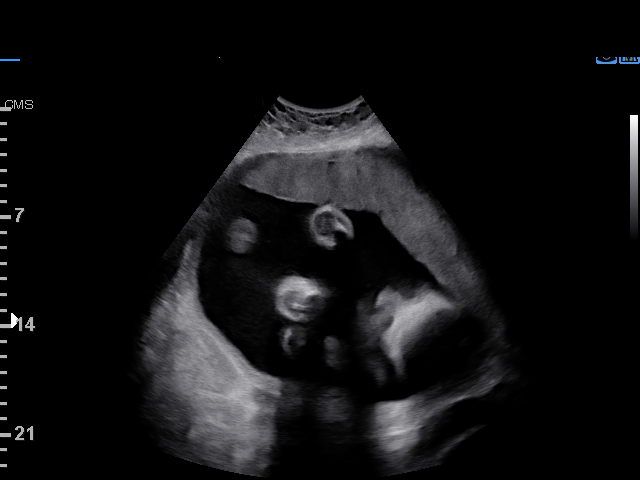
[im 20/22]
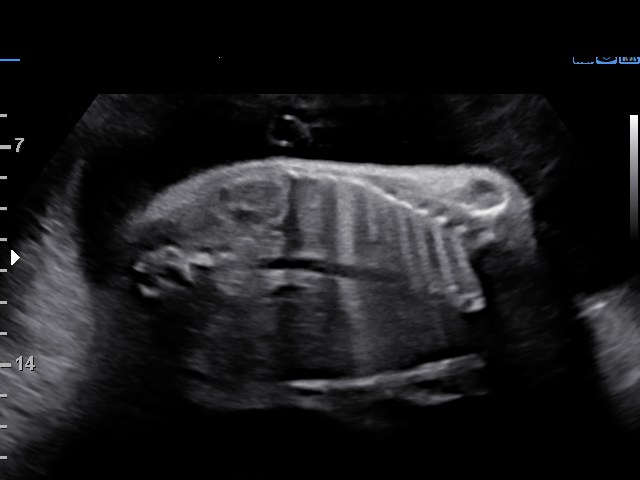
[im 22/22]
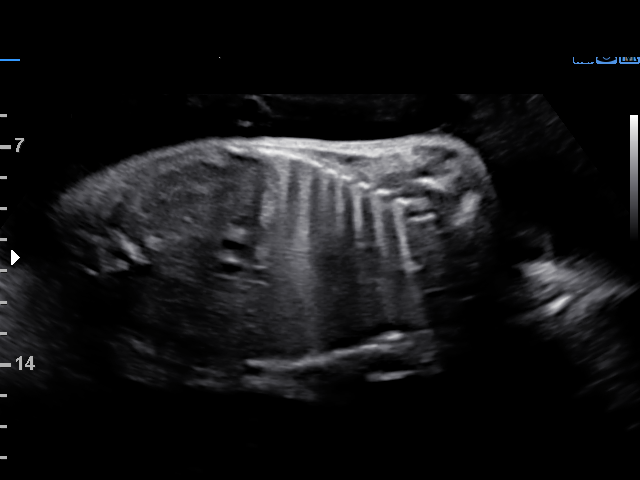

[15 of 22 positions shown; findings below may reference images not displayed]

Road [HOSPITAL]

1  APA              [PHONE_NUMBER]      [PHONE_NUMBER]     [PHONE_NUMBER]
Indications

29 weeks gestation of pregnancy
Ovarian cyst complicating pregnancy            [EK]
(removed [DATE]); benign
Hypertension - Chronic/Pre-existing;           [EK]
labetalol and ASA
Advanced maternal age multigravida 35+,        [EK]
third trimester(low risk NIPS, neg quad
screen)
Polyhydramnios, third trimester, antepartum    [EK]
condition or complication, unspecified fetus
OB History

Blood Type:            Height:  5'3"   Weight (lb):  254      BMI:
Gravidity:    3         Term:   2        Prem:   0        SAB:   0
TOP:          0       Ectopic:  0        Living: 2
Fetal Evaluation

Num Of Fetuses:     1
Fetal Heart         165
Rate(bpm):
Cardiac Activity:   Observed
Presentation:       Cephalic

Amniotic Fluid
AFI FV:      Polyhydramnios

AFI Sum(cm)     %Tile       Largest Pocket(cm)
26.8            > 97

RUQ(cm)       RLQ(cm)       LUQ(cm)        LLQ(cm)
8.15
Biophysical Evaluation

Amniotic F.V:   Polyhydramnios             F. Tone:        Observed
F. Movement:    Observed                   Score:          [DATE]
F. Breathing:   Observed
Gestational Age

LMP:           29w 2d       Date:   [DATE]                 EDD:   [DATE]
Best:          29w 2d    Det. By:   LMP  ([DATE])          EDD:   [DATE]
Impression

SIUP at 29+2 weeks
Mild polyhydramnios
BPP [DATE]
Recommendations

Continue weekly BPPs for excessive amniotic fluid volume

## 2016-04-09 ENCOUNTER — Other Ambulatory Visit: Payer: Self-pay | Admitting: Certified Nurse Midwife

## 2016-04-09 DIAGNOSIS — O24419 Gestational diabetes mellitus in pregnancy, unspecified control: Secondary | ICD-10-CM

## 2016-04-10 ENCOUNTER — Other Ambulatory Visit: Payer: Self-pay | Admitting: *Deleted

## 2016-04-10 MED ORDER — ACCU-CHEK GUIDE W/DEVICE KIT
1.0000 | PACK | Freq: Once | 0 refills | Status: AC
Start: 2016-04-10 — End: 2016-04-10

## 2016-04-10 MED ORDER — GLUCOSE BLOOD VI STRP: ORAL_STRIP | 5 refills | Status: DC

## 2016-04-10 MED ORDER — ACCU-CHEK FASTCLIX LANCETS MISC: 5 refills | Status: DC

## 2016-04-10 NOTE — Progress Notes (Signed)
Diabetic supplies sent to pt pharmacy. Pt aware.

## 2016-04-12 ENCOUNTER — Ambulatory Visit (HOSPITAL_COMMUNITY)
Admission: RE | Admit: 2016-04-12 | Discharge: 2016-04-12 | Disposition: A | Discharge status: Home / Self Care | Payer: Medicaid Other | Source: Ambulatory Visit | Attending: Obstetrics and Gynecology | Admitting: Obstetrics and Gynecology

## 2016-04-12 ENCOUNTER — Other Ambulatory Visit (HOSPITAL_COMMUNITY): Payer: Self-pay | Admitting: Maternal & Fetal Medicine

## 2016-04-12 ENCOUNTER — Encounter (HOSPITAL_COMMUNITY): Payer: Self-pay

## 2016-04-12 DIAGNOSIS — O10013 Pre-existing essential hypertension complicating pregnancy, third trimester: Secondary | ICD-10-CM

## 2016-04-12 DIAGNOSIS — O3483 Maternal care for other abnormalities of pelvic organs, third trimester: Secondary | ICD-10-CM | POA: Insufficient documentation

## 2016-04-12 DIAGNOSIS — O09523 Supervision of elderly multigravida, third trimester: Secondary | ICD-10-CM | POA: Diagnosis not present

## 2016-04-12 DIAGNOSIS — O409XX0 Polyhydramnios, unspecified trimester, not applicable or unspecified: Secondary | ICD-10-CM

## 2016-04-12 DIAGNOSIS — O403XX0 Polyhydramnios, third trimester, not applicable or unspecified: Secondary | ICD-10-CM

## 2016-04-12 DIAGNOSIS — O99210 Obesity complicating pregnancy, unspecified trimester: Secondary | ICD-10-CM

## 2016-04-12 DIAGNOSIS — O0990 Supervision of high risk pregnancy, unspecified, unspecified trimester: Secondary | ICD-10-CM

## 2016-04-12 DIAGNOSIS — O99213 Obesity complicating pregnancy, third trimester: Secondary | ICD-10-CM | POA: Diagnosis not present

## 2016-04-12 DIAGNOSIS — Z3A30 30 weeks gestation of pregnancy: Secondary | ICD-10-CM | POA: Diagnosis not present

## 2016-04-12 DIAGNOSIS — O24410 Gestational diabetes mellitus in pregnancy, diet controlled: Secondary | ICD-10-CM | POA: Diagnosis not present

## 2016-04-12 DIAGNOSIS — G8918 Other acute postprocedural pain: Secondary | ICD-10-CM

## 2016-04-12 DIAGNOSIS — O10919 Unspecified pre-existing hypertension complicating pregnancy, unspecified trimester: Secondary | ICD-10-CM

## 2016-04-12 IMAGING — US US MFM FETAL BPP W/O NON-STRESS
1 series · 12 of 28 positions shown · non-contrast
Comparison: none

[Series 1: us mfm fetal bpp w/o non-stress · 30 acquisitions, 12 frames shown]
[im 2/30]
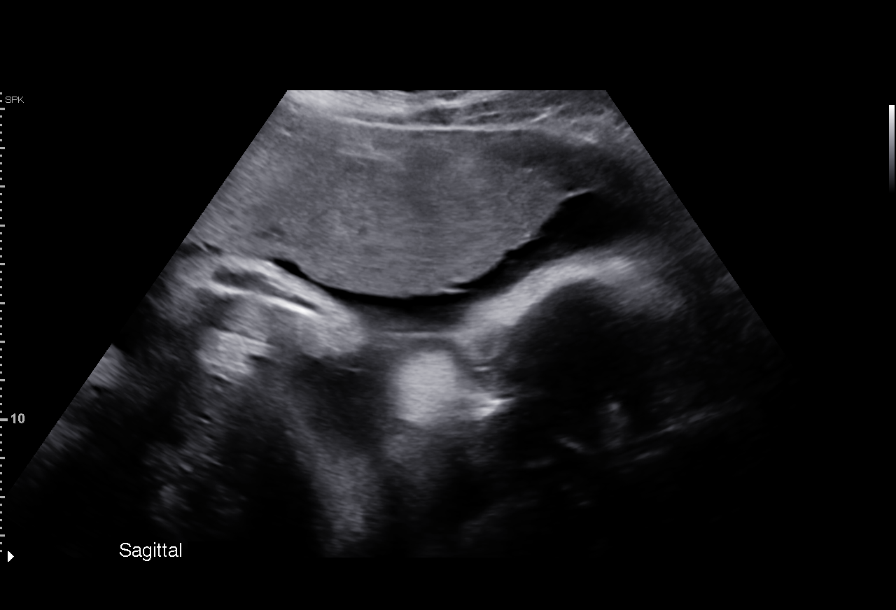
[im 4/30]
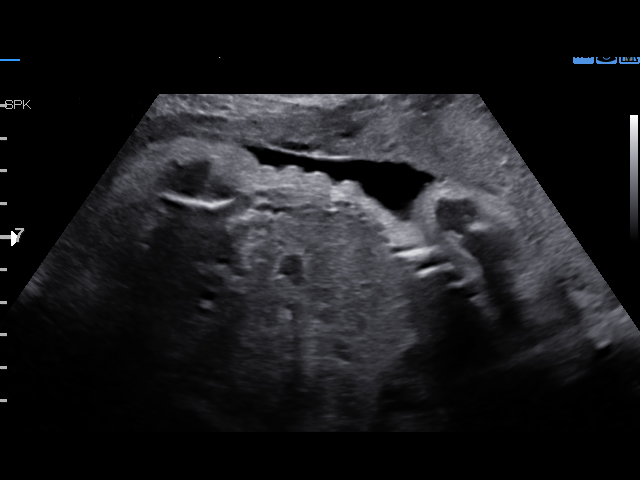
[im 6/30]
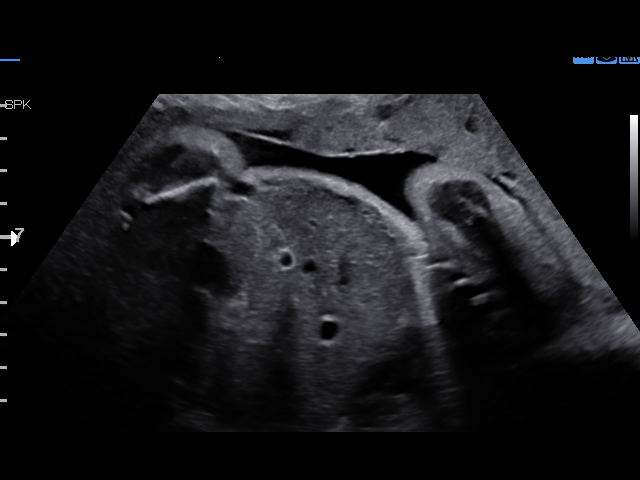
[im 9/30]
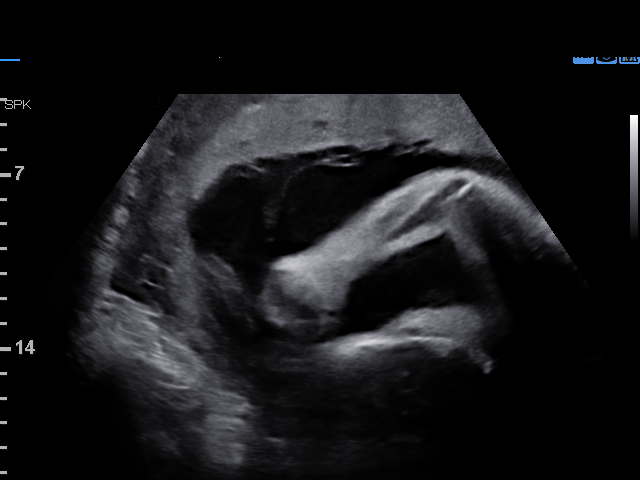
[im 11/30]
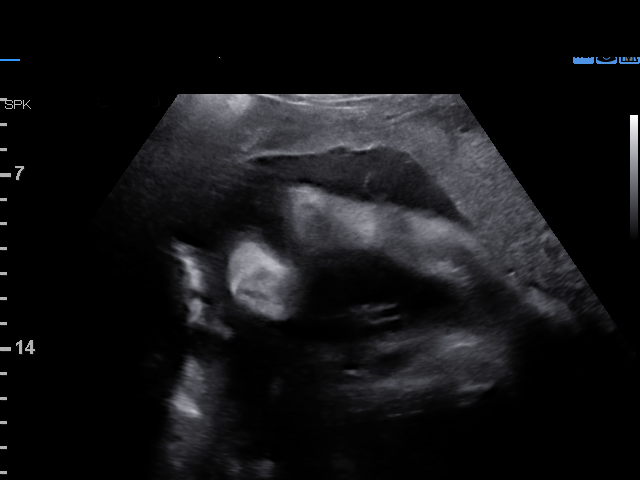
[im 13/30]
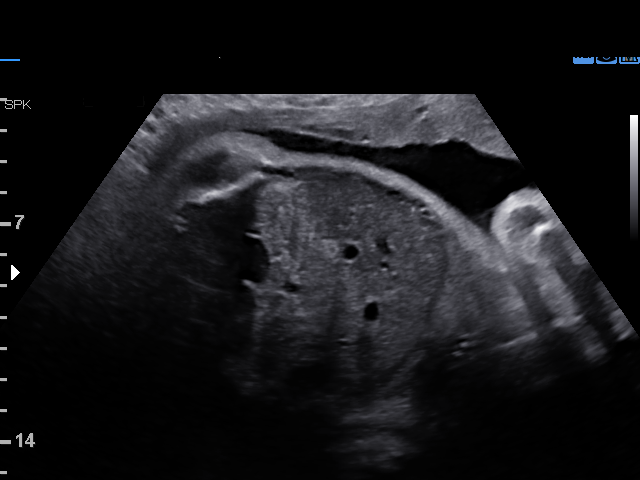
[im 17/30]
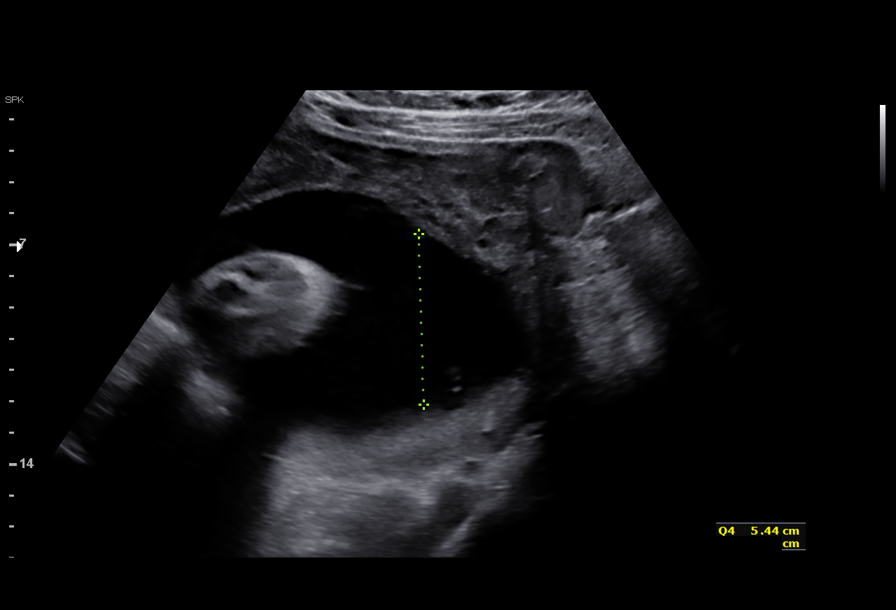
[im 19/30]
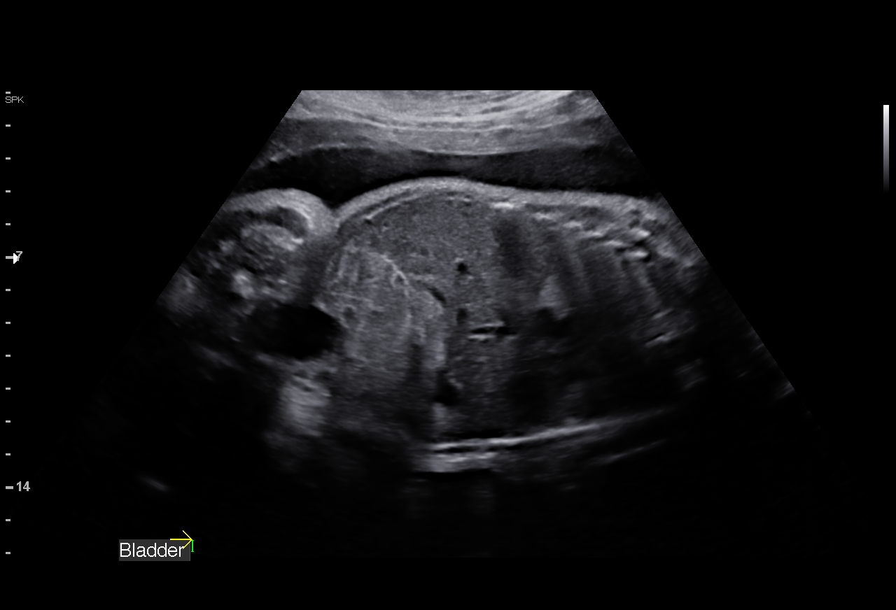
[im 21/30]
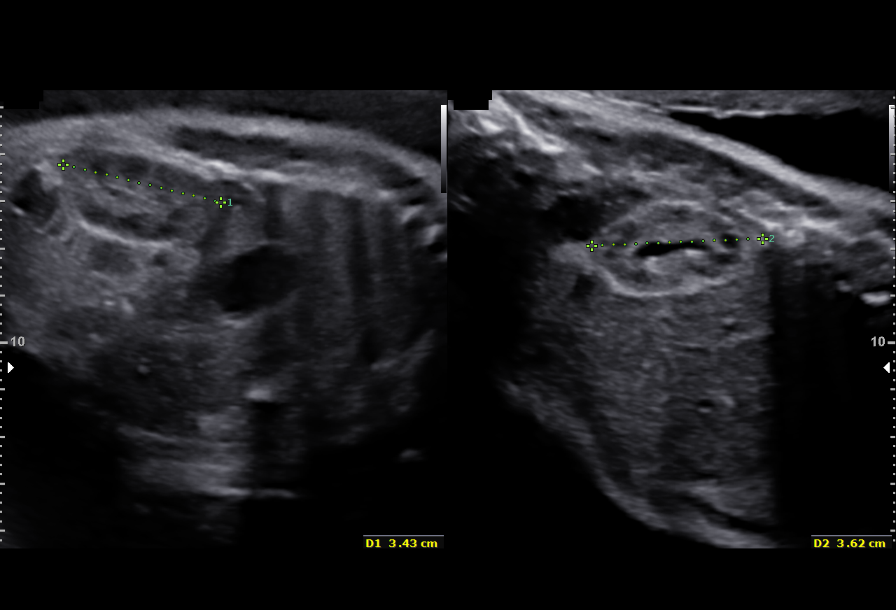
[im 24/30]
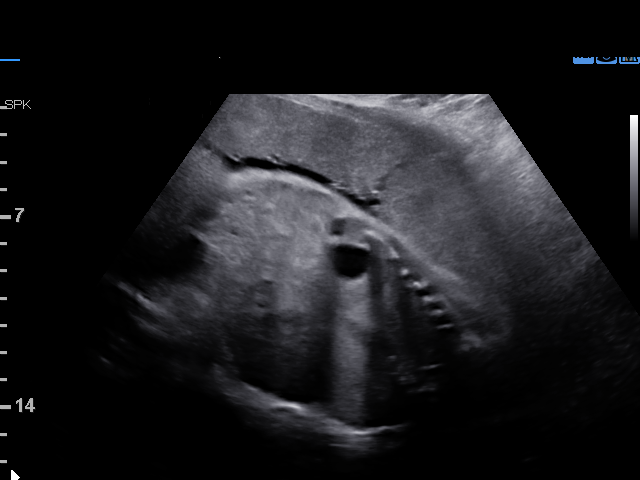
[im 26/30]
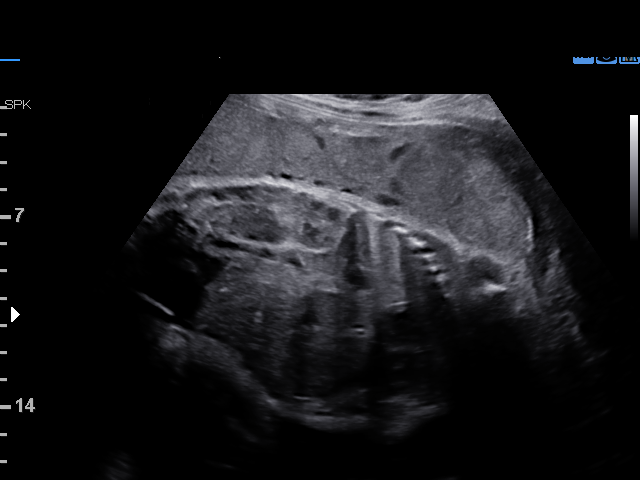
[im 28/30]
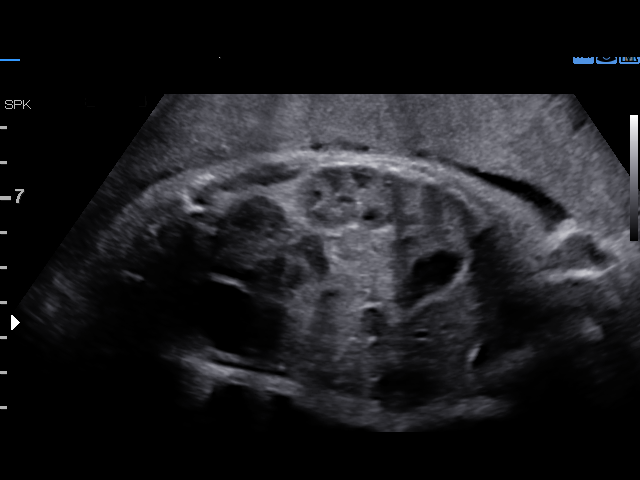

[12 of 28 positions shown; findings below may reference images not displayed]

Road [HOSPITAL]

1  CATHOLIC              [PHONE_NUMBER]      [PHONE_NUMBER]     [PHONE_NUMBER]
Indications

30 weeks gestation of pregnancy
Ovarian cyst complicating pregnancy            [TY]
(removed [DATE]); benign
Hypertension - Chronic/Pre-existing;           [TY]
labetalol and ASA
Advanced maternal age multigravida 35+,        [TY]
third trimester(low risk NIPS, neg quad
screen)
Polyhydramnios, third trimester, antepartum    [TY]
condition or complication, unspecified fetus
Gestational diabetes in pregnancy, diet        [TY]
controlled
Obesity complicating pregnancy, third          [TY]
trimester
OB History

Blood Type:            Height:  5'3"   Weight (lb):  254      BMI:
Gravidity:    3         Term:   2        Prem:   0        SAB:   0
TOP:          0       Ectopic:  0        Living: 2
Fetal Evaluation

Num Of Fetuses:     1
Fetal Heart         136
Rate(bpm):
Cardiac Activity:   Observed
Presentation:       Cephalic
Placenta:           Anterior, above cervical os
Amniotic Fluid
AFI FV:      Polyhydramnios

AFI Sum(cm)     %Tile       Largest Pocket(cm)
25.01           96

RUQ(cm)       RLQ(cm)       LUQ(cm)        LLQ(cm)
9.58
Biophysical Evaluation

Amniotic F.V:   Pocket => 2 cm two         F. Tone:        Observed
planes
F. Movement:    Observed                   N.S.T:          Reactive
F. Breathing:   Not Observed               Score:          [DATE]
Gestational Age

LMP:           30w 2d       Date:   [DATE]                 EDD:   [DATE]
Best:          30w 2d    Det. By:   LMP  ([DATE])          EDD:   [DATE]
Anatomy

Heart:                 Appears normal         Kidneys:                Appear normal
(4CH, axis, and situs
Diaphragm:             Appears normal         Bladder:                Appears normal
Stomach:               Appears normal, left
sided
Impression

SIUP at 30+2 weeks
Cephalic presentation
Mild polyhydramnios
BPP [DATE] (-2 for BM not sustained for 30 secs)
Recommendations

Continue weekly BPPs
Growth US on [DATE]

## 2016-04-17 ENCOUNTER — Ambulatory Visit (INDEPENDENT_AMBULATORY_CARE_PROVIDER_SITE_OTHER): Payer: Medicaid Other | Admitting: Certified Nurse Midwife

## 2016-04-17 VITALS — BP 110/73 | HR 126 | Temp 98.0°F | Wt 263.8 lb

## 2016-04-17 DIAGNOSIS — O403XX0 Polyhydramnios, third trimester, not applicable or unspecified: Secondary | ICD-10-CM

## 2016-04-17 DIAGNOSIS — O24419 Gestational diabetes mellitus in pregnancy, unspecified control: Secondary | ICD-10-CM

## 2016-04-17 DIAGNOSIS — O0990 Supervision of high risk pregnancy, unspecified, unspecified trimester: Secondary | ICD-10-CM

## 2016-04-17 DIAGNOSIS — O99210 Obesity complicating pregnancy, unspecified trimester: Secondary | ICD-10-CM

## 2016-04-17 DIAGNOSIS — O09523 Supervision of elderly multigravida, third trimester: Secondary | ICD-10-CM | POA: Diagnosis not present

## 2016-04-17 DIAGNOSIS — O99213 Obesity complicating pregnancy, third trimester: Secondary | ICD-10-CM

## 2016-04-17 DIAGNOSIS — O10919 Unspecified pre-existing hypertension complicating pregnancy, unspecified trimester: Secondary | ICD-10-CM

## 2016-04-17 DIAGNOSIS — E669 Obesity, unspecified: Secondary | ICD-10-CM

## 2016-04-17 DIAGNOSIS — O10913 Unspecified pre-existing hypertension complicating pregnancy, third trimester: Secondary | ICD-10-CM | POA: Diagnosis not present

## 2016-04-17 NOTE — Progress Notes (Signed)
   PRENATAL VISIT NOTE  Subjective:  Nichole Green is a 36 y.o. G3P2002 at [redacted]w[redacted]d being seen today for ongoing prenatal care.  She is currently monitored for the following issues for this high-risk pregnancy and has AMA (advanced maternal age) multigravida 64+; Chronic hypertension during pregnancy, antepartum; Supervision of high risk pregnancy, antepartum; Chronic hypertension in pregnancy; Obesity affecting pregnancy, antepartum; Preterm uterine contractions; Post-operative pain; Abdominal pain affecting pregnancy; Uterine fibroid; Polyhydramnios affecting pregnancy in third trimester; and GDM (gestational diabetes mellitus) on her problem list.  Patient reports no bleeding, no contractions, no cramping, no leaking and hemorrhoids/constipation.  Contractions: Irritability. Vag. Bleeding: None.  Movement: Absent. Denies leaking of fluid.   The following portions of the patient's history were reviewed and updated as appropriate: allergies, current medications, past family history, past medical history, past social history, past surgical history and problem list. Problem list updated.  Objective:   Vitals:   04/17/16 1400  BP: 110/73  Pulse: (!) 126  Temp: 98 F (36.7 C)  Weight: 263 lb 12.8 oz (119.7 kg)    Fetal Status: Fetal Heart Rate (bpm): 150 Fundal Height: 40 cm Movement: Absent     General:  Alert, oriented and cooperative. Patient is in no acute distress.  Skin: Skin is warm and dry. No rash noted.   Cardiovascular: Normal heart rate noted  Respiratory: Normal respiratory effort, no problems with respiration noted  Abdomen: Soft, gravid, appropriate for gestational age. Pain/Pressure: Present     Pelvic:  Cervical exam deferred        Extremities: Normal range of motion.  Edema: None  Mental Status: Normal mood and affect. Normal behavior. Normal judgment and thought content.   Assessment and Plan:  Pregnancy: G3P2002 at [redacted]w[redacted]d  1. Supervision of high risk pregnancy,  antepartum     Reactive NST     NST: + accels, no decels, moderate variability, Cat. 1 tracing. No contractions on toco.   - Fetal nonstress test; Future  2. Elderly multigravida in third trimester  - Fetal nonstress test; Future  3. Polyhydramnios affecting pregnancy in third trimester     S>D  4. Obesity affecting pregnancy, antepartum   5. Chronic hypertension during pregnancy, antepartum      On labetalol - Fetal nonstress test; Future  6. Gestational diabetes mellitus (GDM) in third trimester, gestational diabetes method of control unspecified     Has class scheduled for 04/18/16.   - Fetal nonstress test; Future  Preterm labor symptoms and general obstetric precautions including but not limited to vaginal bleeding, contractions, leaking of fluid and fetal movement were reviewed in detail with the patient. Please refer to After Visit Summary for other counseling recommendations.  Return for ROB, NST, twice weekly testing.   Morene Crocker, CNM

## 2016-04-19 ENCOUNTER — Ambulatory Visit (HOSPITAL_COMMUNITY)
Admission: RE | Admit: 2016-04-19 | Discharge: 2016-04-19 | Disposition: A | Discharge status: Home / Self Care | Payer: Medicaid Other | Source: Ambulatory Visit | Attending: Obstetrics and Gynecology | Admitting: Obstetrics and Gynecology

## 2016-04-19 ENCOUNTER — Ambulatory Visit (HOSPITAL_COMMUNITY)
Admission: RE | Admit: 2016-04-19 | Discharge: 2016-04-19 | Disposition: A | Discharge status: Home / Self Care | Payer: Medicaid Other | Source: Ambulatory Visit | Attending: Certified Nurse Midwife | Admitting: Certified Nurse Midwife

## 2016-04-19 ENCOUNTER — Encounter: Payer: Medicaid Other | Attending: Obstetrics and Gynecology | Admitting: *Deleted

## 2016-04-19 ENCOUNTER — Other Ambulatory Visit (HOSPITAL_COMMUNITY): Payer: Self-pay | Admitting: Maternal & Fetal Medicine

## 2016-04-19 ENCOUNTER — Other Ambulatory Visit: Payer: Self-pay | Admitting: Certified Nurse Midwife

## 2016-04-19 ENCOUNTER — Encounter (HOSPITAL_COMMUNITY): Payer: Self-pay

## 2016-04-19 DIAGNOSIS — O09523 Supervision of elderly multigravida, third trimester: Secondary | ICD-10-CM | POA: Diagnosis not present

## 2016-04-19 DIAGNOSIS — O409XX0 Polyhydramnios, unspecified trimester, not applicable or unspecified: Secondary | ICD-10-CM

## 2016-04-19 DIAGNOSIS — Z3A31 31 weeks gestation of pregnancy: Secondary | ICD-10-CM | POA: Diagnosis not present

## 2016-04-19 DIAGNOSIS — O10013 Pre-existing essential hypertension complicating pregnancy, third trimester: Secondary | ICD-10-CM | POA: Insufficient documentation

## 2016-04-19 DIAGNOSIS — O24410 Gestational diabetes mellitus in pregnancy, diet controlled: Secondary | ICD-10-CM | POA: Insufficient documentation

## 2016-04-19 DIAGNOSIS — O24419 Gestational diabetes mellitus in pregnancy, unspecified control: Secondary | ICD-10-CM

## 2016-04-19 DIAGNOSIS — O10919 Unspecified pre-existing hypertension complicating pregnancy, unspecified trimester: Secondary | ICD-10-CM

## 2016-04-19 DIAGNOSIS — O403XX0 Polyhydramnios, third trimester, not applicable or unspecified: Secondary | ICD-10-CM | POA: Diagnosis not present

## 2016-04-19 DIAGNOSIS — O0990 Supervision of high risk pregnancy, unspecified, unspecified trimester: Secondary | ICD-10-CM

## 2016-04-19 DIAGNOSIS — O3480 Maternal care for other abnormalities of pelvic organs, unspecified trimester: Secondary | ICD-10-CM | POA: Diagnosis not present

## 2016-04-19 DIAGNOSIS — O99213 Obesity complicating pregnancy, third trimester: Secondary | ICD-10-CM | POA: Insufficient documentation

## 2016-04-19 DIAGNOSIS — O99210 Obesity complicating pregnancy, unspecified trimester: Secondary | ICD-10-CM

## 2016-04-19 DIAGNOSIS — Z713 Dietary counseling and surveillance: Secondary | ICD-10-CM | POA: Insufficient documentation

## 2016-04-19 DIAGNOSIS — G8918 Other acute postprocedural pain: Secondary | ICD-10-CM

## 2016-04-19 IMAGING — US US MFM OB FOLLOW-UP
1 series · 14 of 28 positions shown · non-contrast
Comparison: none

[Series 1: us mfm ob follow-up · 34 acquisitions, 14 frames shown]
[im 2/34]
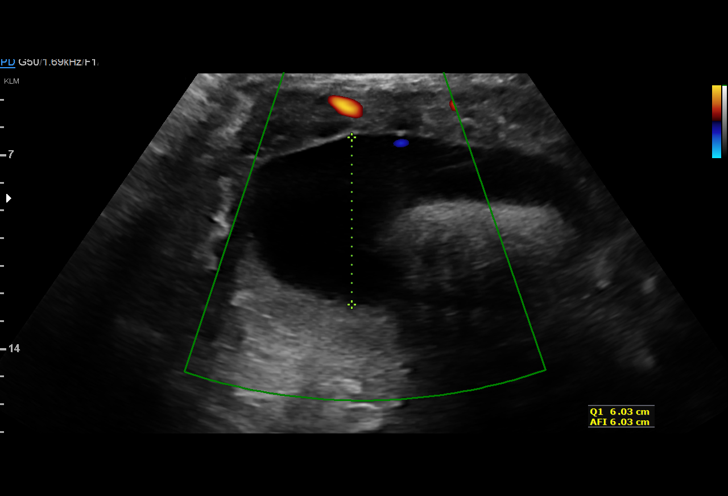
[im 4/34]
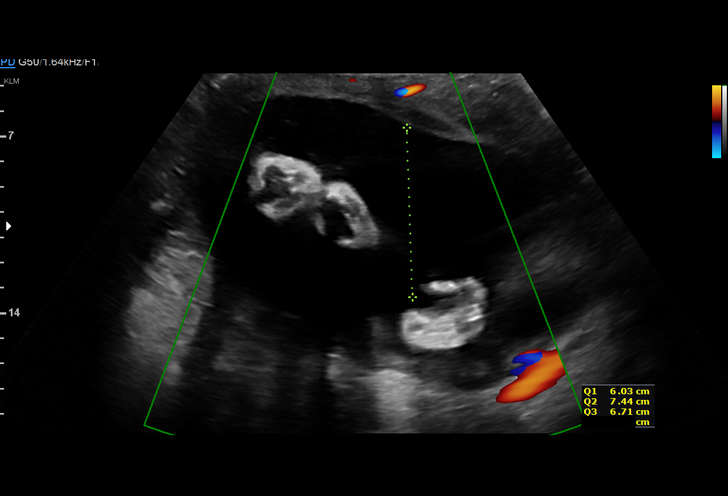
[im 7/34]
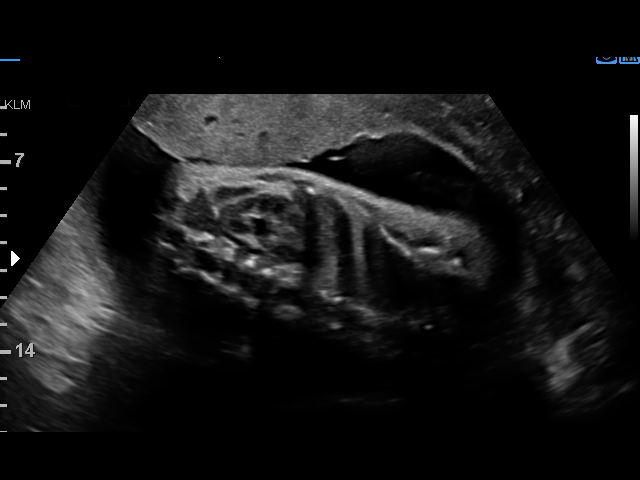
[im 9/34]
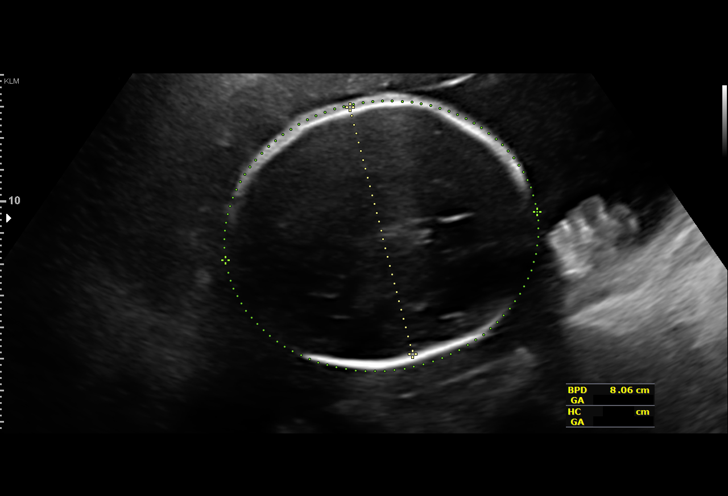
[im 12/34]
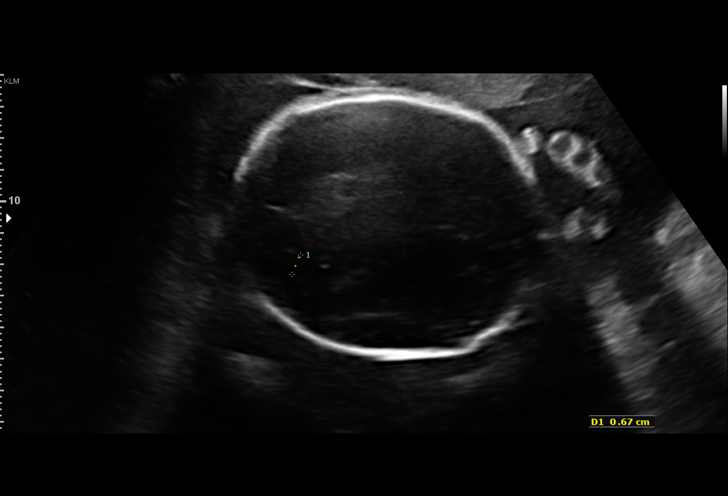
[im 14/34]
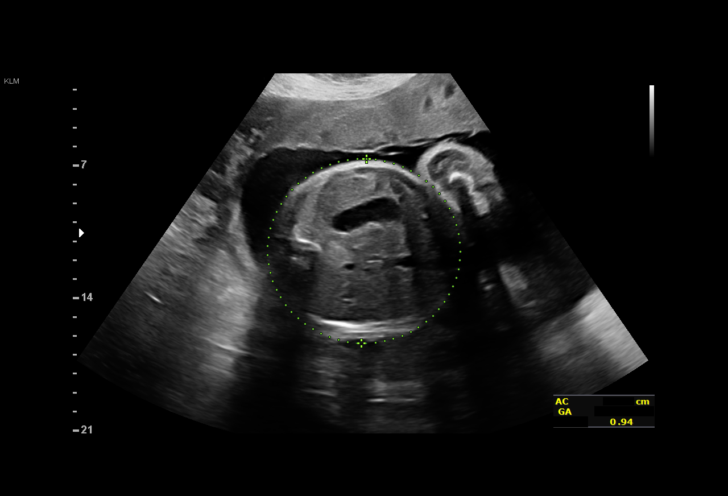
[im 16/34]
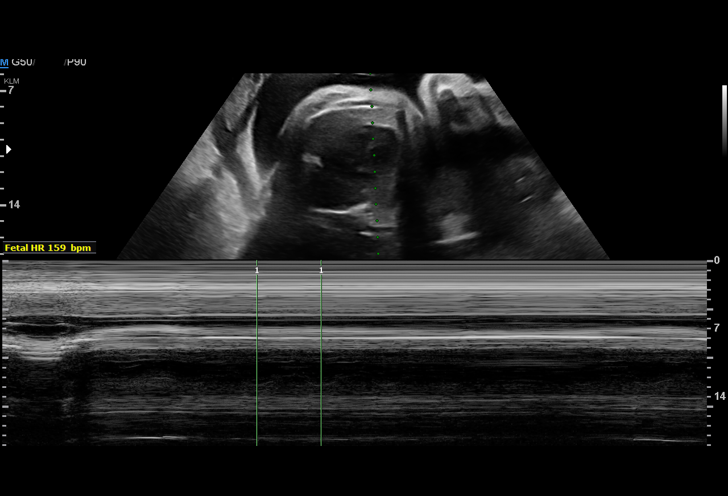
[im 19/34]
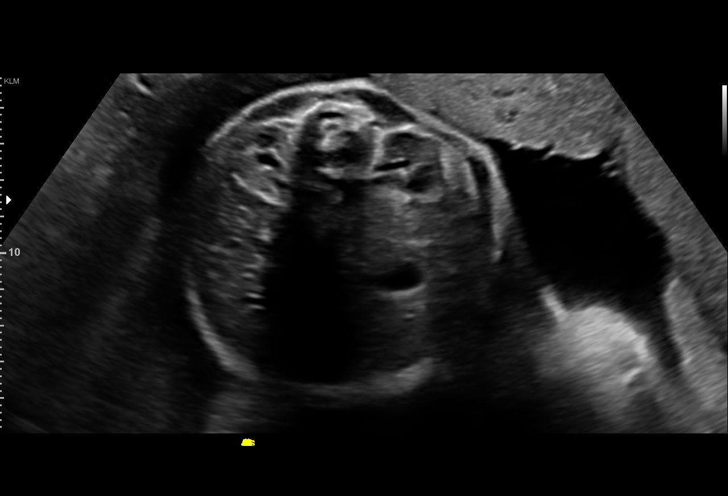
[im 21/34]
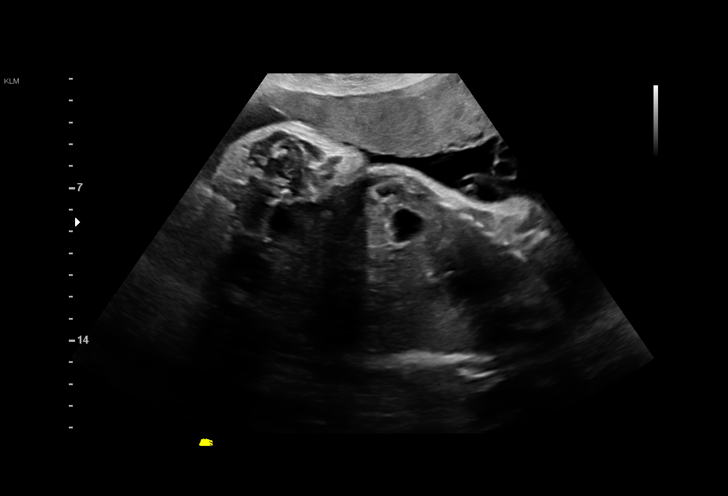
[im 24/34]
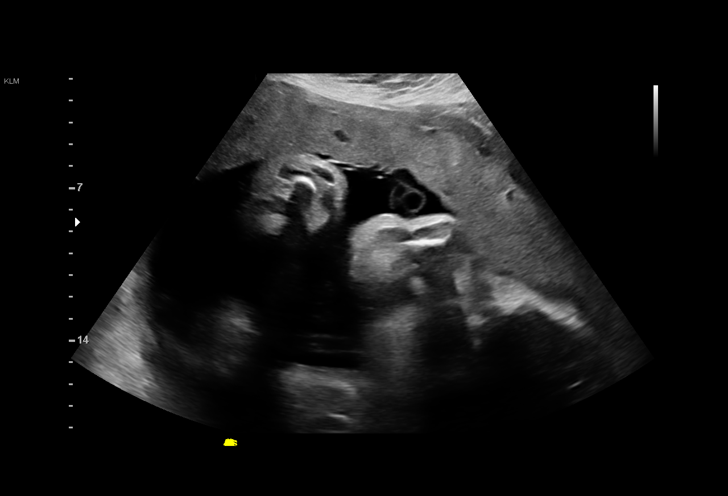
[im 26/34]
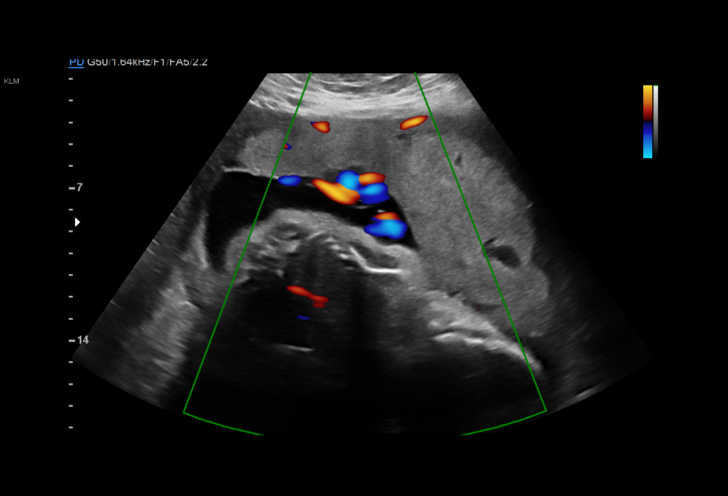
[im 29/34]
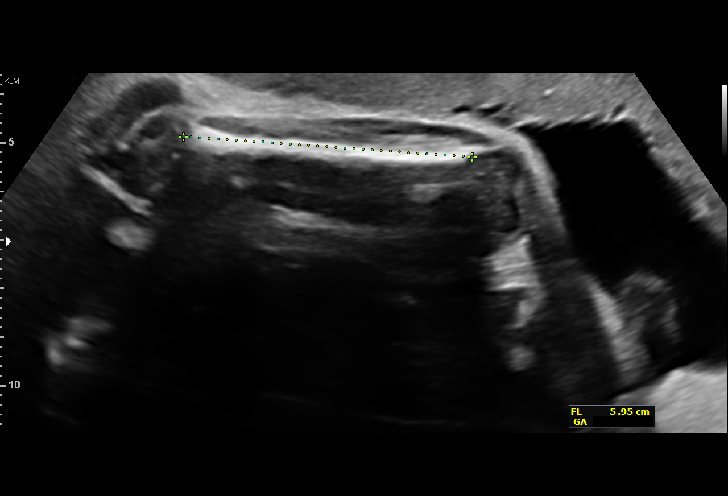
[im 31/34]
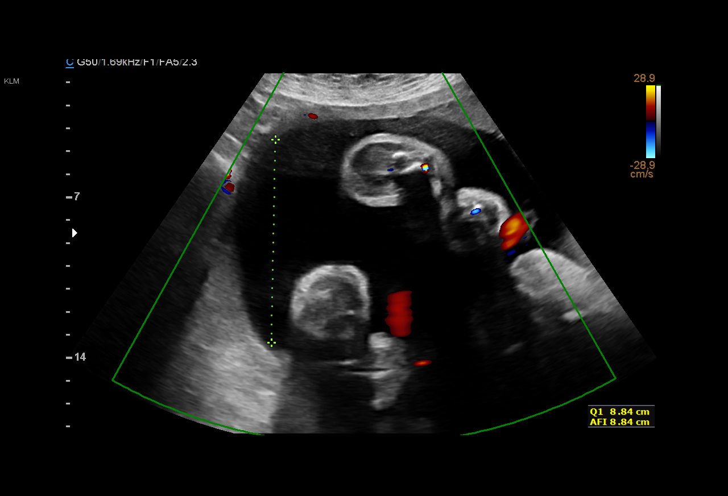
[im 34/34]
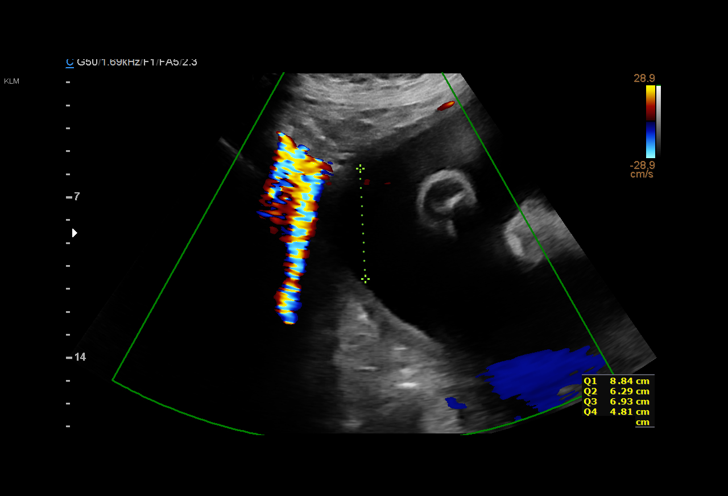

[14 of 28 positions shown; findings below may reference images not displayed]

Road [HOSPITAL]
MAU/Triage

1  ROBERTO              [PHONE_NUMBER]      [PHONE_NUMBER]     [PHONE_NUMBER]
Indications

31 weeks gestation of pregnancy
Ovarian cyst complicating pregnancy            [MJ]
(removed [DATE]); benign
Hypertension - Chronic/Pre-existing;           [MJ]
labetalol and ASA
Advanced maternal age multigravida 35+,        [MJ]
third trimester(low risk NIPS, neg quad
screen)
Polyhydramnios, third trimester, antepartum    [MJ]
condition or complication, unspecified fetus
Gestational diabetes in pregnancy, diet        [MJ]
controlled
Obesity complicating pregnancy, third          [MJ]
trimester
OB History

Blood Type:            Height:  5'3"   Weight (lb):  254      BMI:
Gravidity:    3         Term:   2        Prem:   0        SAB:   0
TOP:          0       Ectopic:  0        Living: 2
Fetal Evaluation

Num Of Fetuses:     1
Fetal Heart         159
Rate(bpm):
Cardiac Activity:   Observed
Presentation:       Cephalic
Placenta:           Anterior, above cervical os
P. Cord Insertion:  Visualized, central
Amniotic Fluid
AFI FV:      Polyhydramnios

AFI Sum(cm)     %Tile       Largest Pocket(cm)
26.8            > 97

RUQ(cm)       RLQ(cm)       LUQ(cm)        LLQ(cm)
8.8
Biophysical Evaluation

Amniotic F.V:   Within normal limits       F. Tone:        Observed
F. Movement:    Observed                   Score:          [DATE]
F. Breathing:   Observed
Biometry

BPD:        80  mm     G. Age:  32w 1d         66  %    CI:        75.48   %   70 - 86
FL/HC:      20.5   %   19.3 -
HC:       292   mm     G. Age:  32w 1d         38  %    HC/AC:      0.95       0.96 -
AC:      307.8  mm     G. Age:  34w 5d       > 97  %    FL/BPD:     74.9   %   71 - 87
FL:       59.9  mm     G. Age:  31w 1d         34  %    FL/AC:      19.5   %   20 - 24

Est. FW:    [MJ]  gm    4 lb 12 oz      83  %
Gestational Age

LMP:           31w 2d       Date:   [DATE]                 EDD:   [DATE]
U/S Today:     32w 4d                                        EDD:   [DATE]
Best:          31w 2d    Det. By:   LMP  ([DATE])          EDD:   [DATE]
Anatomy



Other:  Fetus appears to be a male. Technically difficult due to maternal
habitus.
Cervix Uterus Adnexa

Cervix
Not visualized (advanced GA >[MJ])
Impression

SIUP at 31+2 weeks
Normal interval anatomy; anatomic survey complete
High normal amniotic fluid volume vs mild polyhydramnios
Appropriate interval growth with EFW at the 83rd %tile; AC >
97th %tile
BPP [DATE]
Recommendations

Continue weekly BPPs

## 2016-04-20 ENCOUNTER — Other Ambulatory Visit: Payer: Self-pay | Admitting: Certified Nurse Midwife

## 2016-04-23 ENCOUNTER — Encounter: Payer: Medicaid Other | Admitting: Obstetrics and Gynecology

## 2016-04-26 ENCOUNTER — Ambulatory Visit (HOSPITAL_COMMUNITY): Admission: RE | Admit: 2016-04-26 | Payer: Medicaid Other | Source: Ambulatory Visit

## 2016-04-30 ENCOUNTER — Ambulatory Visit (INDEPENDENT_AMBULATORY_CARE_PROVIDER_SITE_OTHER): Payer: Medicaid Other | Admitting: Obstetrics and Gynecology

## 2016-04-30 VITALS — BP 118/77 | HR 104 | Wt 266.0 lb

## 2016-04-30 DIAGNOSIS — O10913 Unspecified pre-existing hypertension complicating pregnancy, third trimester: Secondary | ICD-10-CM

## 2016-04-30 DIAGNOSIS — O10919 Unspecified pre-existing hypertension complicating pregnancy, unspecified trimester: Secondary | ICD-10-CM

## 2016-04-30 DIAGNOSIS — O24410 Gestational diabetes mellitus in pregnancy, diet controlled: Secondary | ICD-10-CM

## 2016-04-30 DIAGNOSIS — O0990 Supervision of high risk pregnancy, unspecified, unspecified trimester: Secondary | ICD-10-CM

## 2016-04-30 DIAGNOSIS — O09523 Supervision of elderly multigravida, third trimester: Secondary | ICD-10-CM

## 2016-04-30 MED ORDER — GLYBURIDE 2.5 MG PO TABS: 2.5000 mg | ORAL_TABLET | Freq: Two times a day (BID) | ORAL | 3 refills | Status: DC

## 2016-04-30 NOTE — Progress Notes (Signed)
Patient reports contractions that come and go, reports good fetal movement.

## 2016-04-30 NOTE — Progress Notes (Signed)
   PRENATAL VISIT NOTE  Subjective:  Nichole Green is a 36 y.o. G3P2002 at [redacted]w[redacted]d being seen today for ongoing prenatal care.  She is currently monitored for the following issues for this high-risk pregnancy and has AMA (advanced maternal age) multigravida 24+; Chronic hypertension during pregnancy, antepartum; Supervision of high risk pregnancy, antepartum; Chronic hypertension in pregnancy; Obesity affecting pregnancy, antepartum; Preterm uterine contractions; Post-operative pain; Abdominal pain affecting pregnancy; Uterine fibroid; Polyhydramnios affecting pregnancy in third trimester; and GDM (gestational diabetes mellitus) on her problem list.  Patient reports no complaints.  Contractions: Irregular. Vag. Bleeding: None.  Movement: Present. Denies leaking of fluid.   The following portions of the patient's history were reviewed and updated as appropriate: allergies, current medications, past family history, past medical history, past social history, past surgical history and problem list. Problem list updated.  Objective:   Vitals:   04/30/16 1405  BP: 118/77  Pulse: (!) 104  Weight: 266 lb (120.7 kg)    Fetal Status: Fetal Heart Rate (bpm): NST   Movement: Present     General:  Alert, oriented and cooperative. Patient is in no acute distress.  Skin: Skin is warm and dry. No rash noted.   Cardiovascular: Normal heart rate noted  Respiratory: Normal respiratory effort, no problems with respiration noted  Abdomen: Soft, gravid, appropriate for gestational age. Pain/Pressure: Absent     Pelvic:  Cervical exam deferred        Extremities: Normal range of motion.  Edema: None  Mental Status: Normal mood and affect. Normal behavior. Normal judgment and thought content.   Assessment and Plan:  Pregnancy: G3P2002 at [redacted]w[redacted]d  1. Supervision of high risk pregnancy, antepartum Patient is doing well without complaints  2. Elderly multigravida in third trimester Normal NIPS  3. Chronic  hypertension in pregnancy Continue labetalol Patient was never taking ASA since 12 weeks. No benefits will be obtained by starting now  4. Diet controlled gestational diabetes mellitus (GDM) in third trimester CBGs reviewed and all fasting in 110's, majority of pp 138-177 range Will start glyburide 2.5 mg BID Follow up growth ultrasound on 3/1 Discussed plan for IOL at 39 weeks if no labor NST reviewed and reactive  Preterm labor symptoms and general obstetric precautions including but not limited to vaginal bleeding, contractions, leaking of fluid and fetal movement were reviewed in detail with the patient. Please refer to After Visit Summary for other counseling recommendations.  No Follow-up on file.   Mora Bellman, MD

## 2016-05-03 ENCOUNTER — Encounter (HOSPITAL_COMMUNITY): Payer: Self-pay

## 2016-05-03 ENCOUNTER — Ambulatory Visit (HOSPITAL_COMMUNITY)
Admission: RE | Admit: 2016-05-03 | Discharge: 2016-05-03 | Disposition: A | Discharge status: Home / Self Care | Payer: Medicaid Other | Source: Ambulatory Visit | Attending: Obstetrics and Gynecology | Admitting: Obstetrics and Gynecology

## 2016-05-03 ENCOUNTER — Ambulatory Visit (HOSPITAL_COMMUNITY): Payer: Medicaid Other

## 2016-05-03 DIAGNOSIS — O403XX0 Polyhydramnios, third trimester, not applicable or unspecified: Secondary | ICD-10-CM | POA: Diagnosis present

## 2016-05-03 DIAGNOSIS — O10013 Pre-existing essential hypertension complicating pregnancy, third trimester: Secondary | ICD-10-CM | POA: Diagnosis not present

## 2016-05-03 DIAGNOSIS — O10919 Unspecified pre-existing hypertension complicating pregnancy, unspecified trimester: Secondary | ICD-10-CM

## 2016-05-03 DIAGNOSIS — O3483 Maternal care for other abnormalities of pelvic organs, third trimester: Secondary | ICD-10-CM | POA: Diagnosis not present

## 2016-05-03 DIAGNOSIS — Z3A33 33 weeks gestation of pregnancy: Secondary | ICD-10-CM | POA: Insufficient documentation

## 2016-05-03 DIAGNOSIS — O99210 Obesity complicating pregnancy, unspecified trimester: Secondary | ICD-10-CM

## 2016-05-03 DIAGNOSIS — O09523 Supervision of elderly multigravida, third trimester: Secondary | ICD-10-CM | POA: Diagnosis not present

## 2016-05-03 DIAGNOSIS — O0990 Supervision of high risk pregnancy, unspecified, unspecified trimester: Secondary | ICD-10-CM

## 2016-05-03 DIAGNOSIS — O409XX0 Polyhydramnios, unspecified trimester, not applicable or unspecified: Secondary | ICD-10-CM

## 2016-05-03 DIAGNOSIS — O99213 Obesity complicating pregnancy, third trimester: Secondary | ICD-10-CM | POA: Diagnosis not present

## 2016-05-03 DIAGNOSIS — G8918 Other acute postprocedural pain: Secondary | ICD-10-CM

## 2016-05-03 DIAGNOSIS — O24410 Gestational diabetes mellitus in pregnancy, diet controlled: Secondary | ICD-10-CM | POA: Insufficient documentation

## 2016-05-03 IMAGING — US US MFM FETAL BPP W/O NON-STRESS
1 series · 12 of 13 positions shown · non-contrast
Comparison: none

[Series 1: us mfm fetal bpp w/o non-stress · 13 acquisitions, 12 frames shown]
[im 1/13]
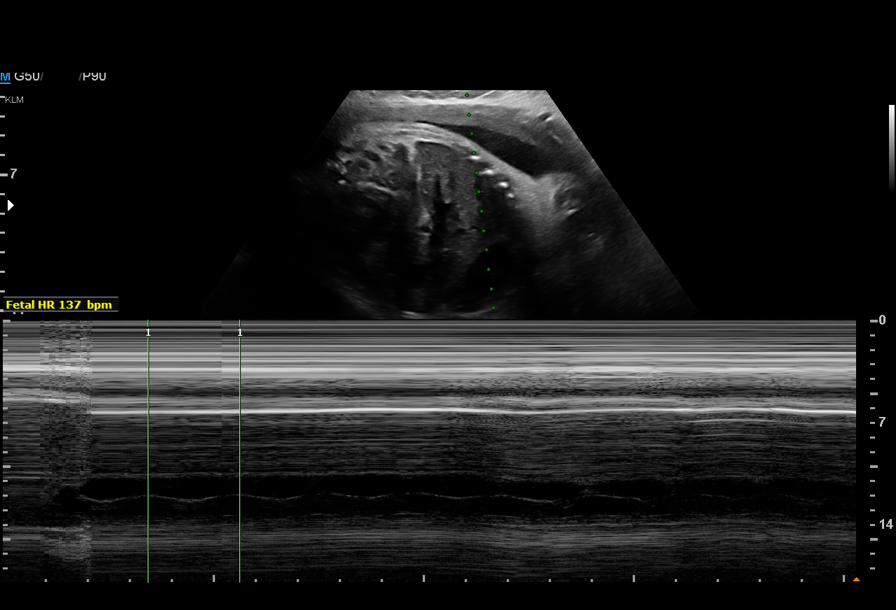
[im 2/13]
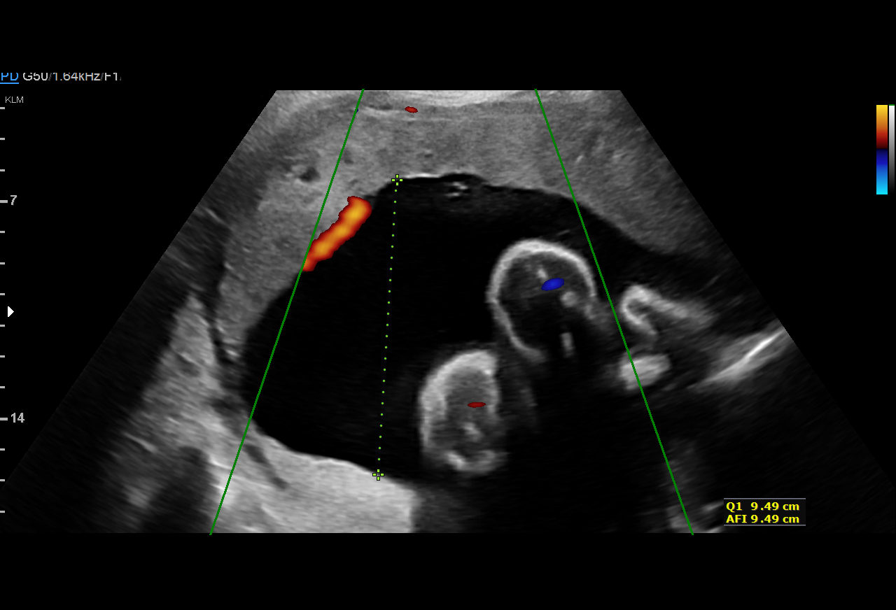
[im 3/13]
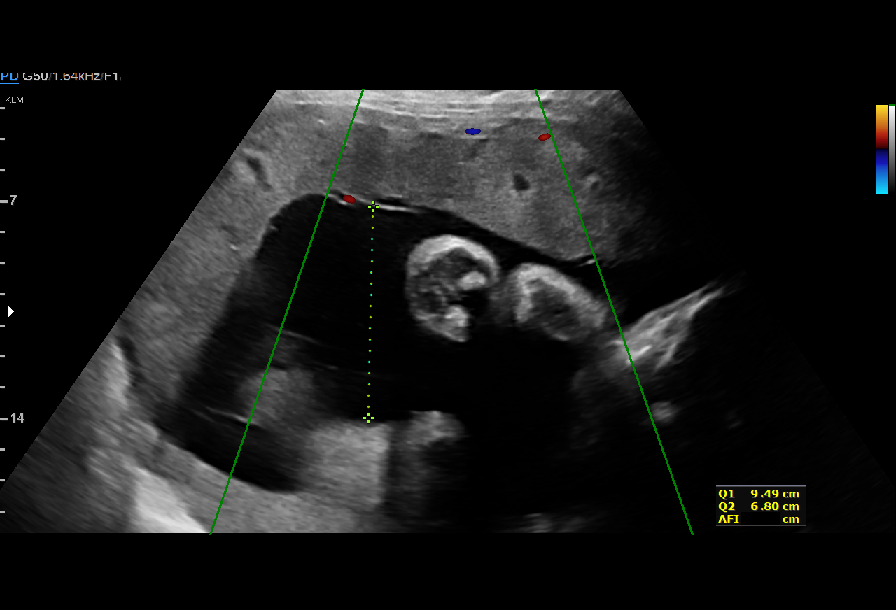
[im 4/13]
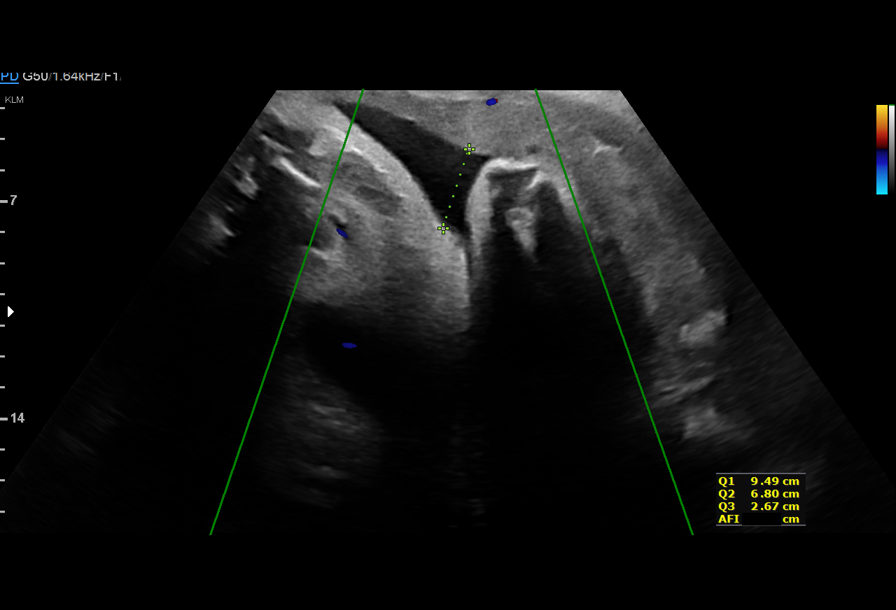
[im 5/13]
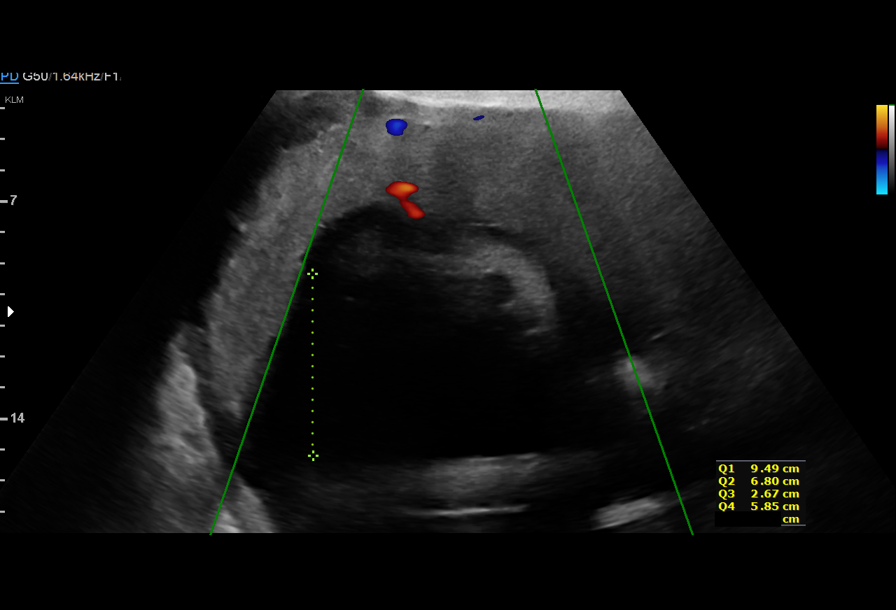
[im 6/13]
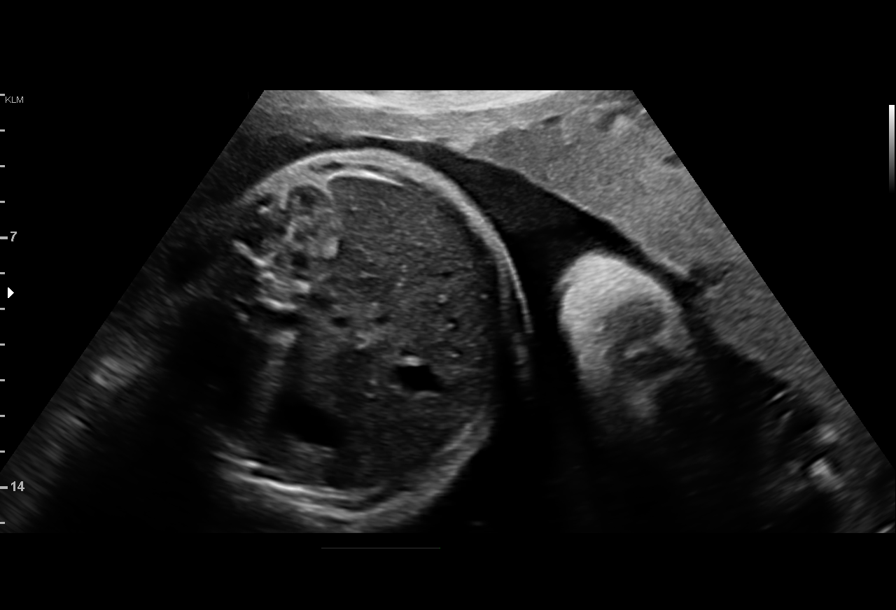
[im 8/13]
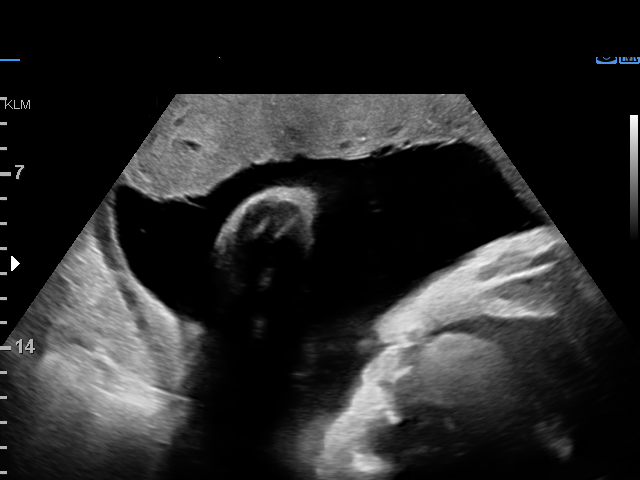
[im 9/13]
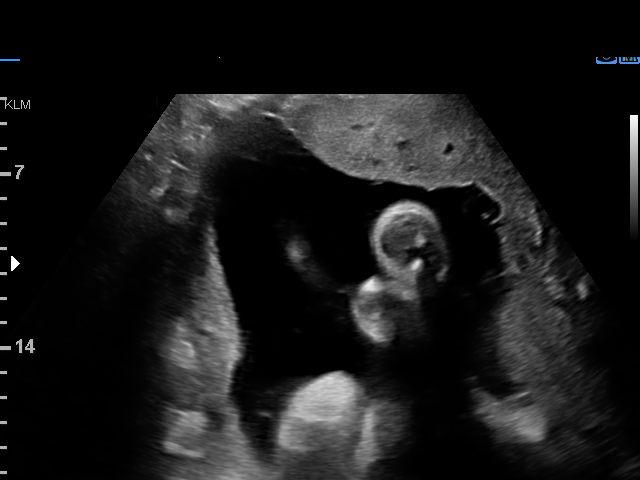
[im 10/13]
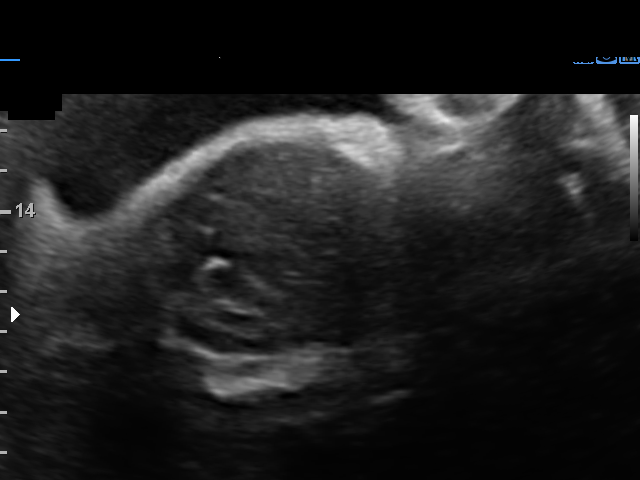
[im 11/13]
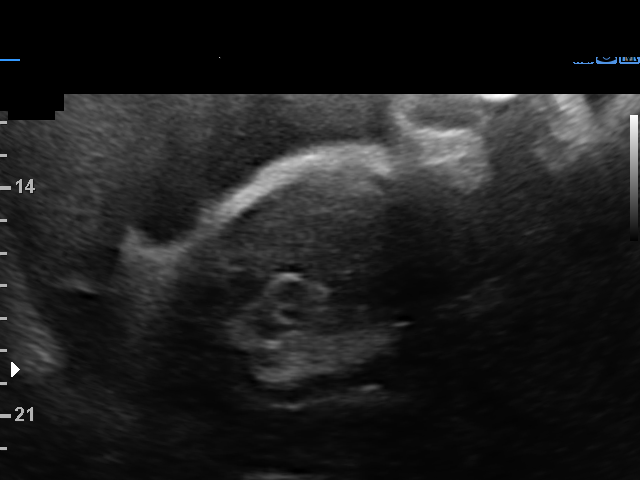
[im 12/13]
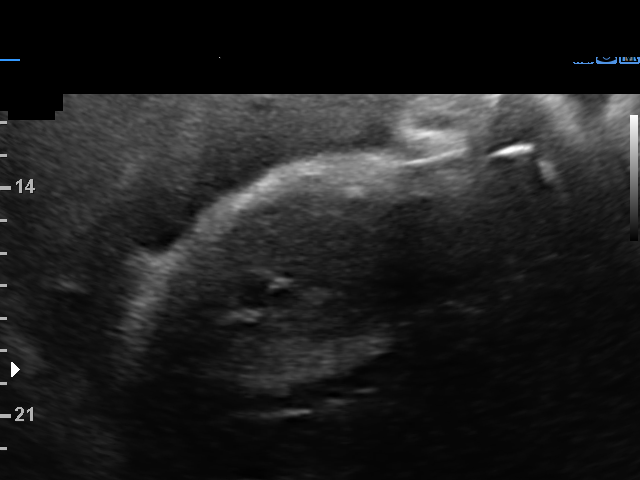
[im 13/13]
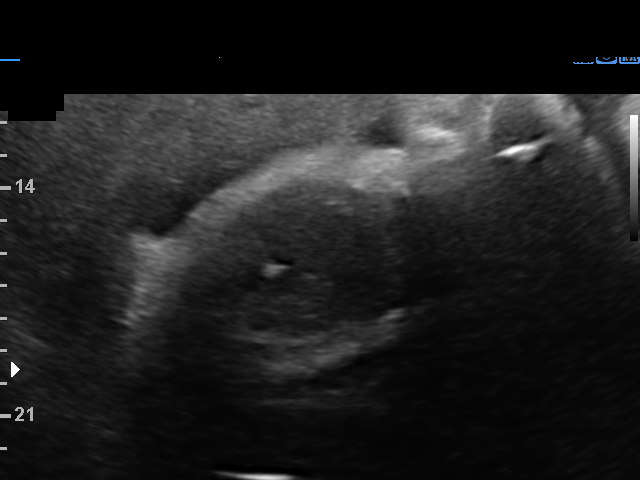

[12 of 13 positions shown; findings below may reference images not displayed]

Road [HOSPITAL]

1  BERNDT              [PHONE_NUMBER]      [PHONE_NUMBER]     [PHONE_NUMBER]
Indications

33 weeks gestation of pregnancy
Ovarian cyst complicating pregnancy            [0T]
(removed [DATE]); benign
Hypertension - Chronic/Pre-existing;           [0T]
labetalol and ASA
Advanced maternal age multigravida 35+,        [0T]
third trimester(low risk NIPS, neg quad
screen)
Polyhydramnios, third trimester, antepartum    [0T]
condition or complication, unspecified fetus
Gestational diabetes in pregnancy, diet        [0T]
controlled
Obesity complicating pregnancy, third          [0T]
trimester
OB History

Blood Type:            Height:  5'3"   Weight (lb):  254       BMI:
Gravidity:    3         Term:   2        Prem:   0        SAB:   0
TOP:          0       Ectopic:  0        Living: 2
Fetal Evaluation

Num Of Fetuses:     1
Fetal Heart         137
Rate(bpm):
Cardiac Activity:   Observed
Presentation:       Cephalic
Amniotic Fluid
AFI FV:      Polyhydramnios

AFI Sum(cm)     %Tile       Largest Pocket(cm)
24.81           95

RUQ(cm)       RLQ(cm)       LUQ(cm)        LLQ(cm)
9.49

Comment:    [DATE] in 20 mins
Biophysical Evaluation

Amniotic F.V:   Within normal limits       F. Tone:        Observed
F. Movement:    Observed                   Score:          [DATE]
F. Breathing:   Observed
Gestational Age

LMP:           33w 2d        Date:  [DATE]                 EDD:   [DATE]
Best:          33w 2d     Det. By:  LMP  ([DATE])          EDD:   [DATE]
Impression

Single IUP at 33w 2d
Chronic hypertension, A2 GDM, polyhydramnios
Cephalic presentation
BPP [DATE]
Mild polyhydramnios is noted (AFI 24 cm with MVP of 9.5 cm)
Recommendations

Continue weekly BPPs
Ultasound for growth in 3 weeks

## 2016-05-07 ENCOUNTER — Ambulatory Visit (INDEPENDENT_AMBULATORY_CARE_PROVIDER_SITE_OTHER): Payer: Medicaid Other | Admitting: Obstetrics and Gynecology

## 2016-05-07 VITALS — BP 122/81 | HR 108 | Wt 271.7 lb

## 2016-05-07 DIAGNOSIS — E669 Obesity, unspecified: Secondary | ICD-10-CM

## 2016-05-07 DIAGNOSIS — O24410 Gestational diabetes mellitus in pregnancy, diet controlled: Secondary | ICD-10-CM | POA: Diagnosis not present

## 2016-05-07 DIAGNOSIS — O10913 Unspecified pre-existing hypertension complicating pregnancy, third trimester: Secondary | ICD-10-CM

## 2016-05-07 DIAGNOSIS — O403XX0 Polyhydramnios, third trimester, not applicable or unspecified: Secondary | ICD-10-CM

## 2016-05-07 DIAGNOSIS — O09523 Supervision of elderly multigravida, third trimester: Secondary | ICD-10-CM

## 2016-05-07 DIAGNOSIS — O99213 Obesity complicating pregnancy, third trimester: Secondary | ICD-10-CM

## 2016-05-07 DIAGNOSIS — O99210 Obesity complicating pregnancy, unspecified trimester: Secondary | ICD-10-CM

## 2016-05-07 DIAGNOSIS — O0993 Supervision of high risk pregnancy, unspecified, third trimester: Secondary | ICD-10-CM

## 2016-05-07 DIAGNOSIS — O10919 Unspecified pre-existing hypertension complicating pregnancy, unspecified trimester: Secondary | ICD-10-CM

## 2016-05-07 DIAGNOSIS — O0990 Supervision of high risk pregnancy, unspecified, unspecified trimester: Secondary | ICD-10-CM

## 2016-05-07 MED ORDER — GLYBURIDE 2.5 MG PO TABS: ORAL_TABLET | ORAL | 3 refills | Status: DC

## 2016-05-07 NOTE — Progress Notes (Signed)
   PRENATAL VISIT NOTE  Subjective:  Nichole Green is a 36 y.o. G3P2002 at [redacted]w[redacted]d being seen today for ongoing prenatal care.  She is currently monitored for the following issues for this high-risk pregnancy and has AMA (advanced maternal age) multigravida 23+; Chronic hypertension during pregnancy, antepartum; Supervision of high risk pregnancy, antepartum; Chronic hypertension in pregnancy; Obesity affecting pregnancy, antepartum; Preterm uterine contractions; Post-operative pain; Abdominal pain affecting pregnancy; Uterine fibroid; Polyhydramnios affecting pregnancy in third trimester; and GDM (gestational diabetes mellitus) on her problem list.  Patient reports no complaints.  Contractions: Irregular. Vag. Bleeding: None.  Movement: Present. Denies leaking of fluid.   The following portions of the patient's history were reviewed and updated as appropriate: allergies, current medications, past family history, past medical history, past social history, past surgical history and problem list. Problem list updated.  Objective:   Vitals:   05/07/16 1417  BP: 122/81  Pulse: (!) 108  Weight: 271 lb 11.2 oz (123.2 kg)    Fetal Status: Fetal Heart Rate (bpm): nst   Movement: Present     General:  Alert, oriented and cooperative. Patient is in no acute distress.  Skin: Skin is warm and dry. No rash noted.   Cardiovascular: Normal heart rate noted  Respiratory: Normal respiratory effort, no problems with respiration noted  Abdomen: Soft, gravid, appropriate for gestational age. Pain/Pressure: Present     Pelvic:  Cervical exam deferred        Extremities: Normal range of motion.  Edema: Trace  Mental Status: Normal mood and affect. Normal behavior. Normal judgment and thought content.   Assessment and Plan:  Pregnancy: G3P2002 at [redacted]w[redacted]d  1. Supervision of high risk pregnancy in third trimester Patient is doing well without complaints - Fetal nonstress test; Future  2. Diet controlled  gestational diabetes mellitus (GDM) in third trimester CBGs reviewed and all fasting values elevated 110-126. PP great majority within range. Will increase glyburide to 2.5 mg in am and 5 mg in pm Follow up growth ultrasound on 3/1 NST reviewed and reactive with baseline 135, mod variability, +accels, no decels  3. Chronic hypertension during pregnancy, antepartum Continue labetalol and ASA  4. Polyhydramnios affecting pregnancy in third trimester   5. Obesity affecting pregnancy, antepartum   6. Elderly multigravida in third trimester Normal NIPS  7. Chronic hypertension in pregnancy   Preterm labor symptoms and general obstetric precautions including but not limited to vaginal bleeding, contractions, leaking of fluid and fetal movement were reviewed in detail with the patient. Please refer to After Visit Summary for other counseling recommendations.  Return in about 1 week (around 05/14/2016) for ROB/NST.   Mora Bellman, MD

## 2016-05-10 ENCOUNTER — Ambulatory Visit (HOSPITAL_COMMUNITY): Admission: RE | Admit: 2016-05-10 | Payer: Medicaid Other | Source: Ambulatory Visit

## 2016-05-14 ENCOUNTER — Ambulatory Visit (INDEPENDENT_AMBULATORY_CARE_PROVIDER_SITE_OTHER): Payer: Medicaid Other | Admitting: Obstetrics and Gynecology

## 2016-05-14 VITALS — BP 118/71 | HR 101 | Wt 271.0 lb

## 2016-05-14 DIAGNOSIS — O403XX0 Polyhydramnios, third trimester, not applicable or unspecified: Secondary | ICD-10-CM

## 2016-05-14 DIAGNOSIS — E669 Obesity, unspecified: Secondary | ICD-10-CM

## 2016-05-14 DIAGNOSIS — O24410 Gestational diabetes mellitus in pregnancy, diet controlled: Secondary | ICD-10-CM

## 2016-05-14 DIAGNOSIS — O99210 Obesity complicating pregnancy, unspecified trimester: Secondary | ICD-10-CM

## 2016-05-14 DIAGNOSIS — O0990 Supervision of high risk pregnancy, unspecified, unspecified trimester: Secondary | ICD-10-CM

## 2016-05-14 DIAGNOSIS — O99213 Obesity complicating pregnancy, third trimester: Secondary | ICD-10-CM

## 2016-05-14 DIAGNOSIS — O10919 Unspecified pre-existing hypertension complicating pregnancy, unspecified trimester: Secondary | ICD-10-CM

## 2016-05-14 DIAGNOSIS — O10913 Unspecified pre-existing hypertension complicating pregnancy, third trimester: Secondary | ICD-10-CM

## 2016-05-14 DIAGNOSIS — O09523 Supervision of elderly multigravida, third trimester: Secondary | ICD-10-CM

## 2016-05-14 MED ORDER — LABETALOL HCL 100 MG PO TABS: 100.0000 mg | ORAL_TABLET | Freq: Two times a day (BID) | ORAL | 2 refills | Status: DC

## 2016-05-14 MED ORDER — GLYBURIDE 2.5 MG PO TABS: 2.5000 mg | ORAL_TABLET | Freq: Two times a day (BID) | ORAL | 3 refills | Status: DC

## 2016-05-14 MED ORDER — GLUCOSE BLOOD VI STRP: ORAL_STRIP | 5 refills | Status: DC

## 2016-05-14 NOTE — Progress Notes (Signed)
   PRENATAL VISIT NOTE  Subjective:  Nichole Green is a 36 y.o. G3P2002 at [redacted]w[redacted]d being seen today for ongoing prenatal care.  She is currently monitored for the following issues for this high-risk pregnancy and has AMA (advanced maternal age) multigravida 21+; Chronic hypertension during pregnancy, antepartum; Supervision of high risk pregnancy, antepartum; Obesity affecting pregnancy, antepartum; Preterm uterine contractions; Post-operative pain; Abdominal pain affecting pregnancy; Uterine fibroid; Polyhydramnios affecting pregnancy in third trimester; and GDM (gestational diabetes mellitus) on her problem list.  Patient reports no complaints.  Contractions: Irregular. Vag. Bleeding: None.  Movement: Present. Denies leaking of fluid.   The following portions of the patient's history were reviewed and updated as appropriate: allergies, current medications, past family history, past medical history, past social history, past surgical history and problem list. Problem list updated.  Objective:   Vitals:   05/14/16 1432  Weight: 271 lb (122.9 kg)    Fetal Status: Fetal Heart Rate (bpm): NST Fundal Height: 40 cm Movement: Present     General:  Alert, oriented and cooperative. Patient is in no acute distress.  Skin: Skin is warm and dry. No rash noted.   Cardiovascular: Normal heart rate noted  Respiratory: Normal respiratory effort, no problems with respiration noted  Abdomen: Soft, gravid, appropriate for gestational age. Pain/Pressure: Present     Pelvic:  Cervical exam deferred        Extremities: Normal range of motion.  Edema: Trace  Mental Status: Normal mood and affect. Normal behavior. Normal judgment and thought content.   Assessment and Plan:  Pregnancy: G3P2002 at [redacted]w[redacted]d  1. Gestational diabetes mellitus (GDM) in third trimester CBGs reviewed and great majority within range. A few elevated fasting values as high as 101 during periods of time when patient eats in the middle of the  night. Patient was instructed to consume a protein rich snack at bedtime to combat midnight hunger Contine glyburide 2.5/5mg    2. Supervision of high risk pregnancy, antepartum Patient is doing well without complaints  3. Polyhydramnios affecting pregnancy in third trimester Follow up growth ultrasound on 3/1  4. Chronic hypertension during pregnancy, antepartum Well controlled on labetalol  Patient has not been taking ASA NST reviewed and reactive with baseline 150, mod variability, + accels, no decels. Toco: with irregular contractions  5. Obesity affecting pregnancy, antepartum Discussed excessive weight gain and to monitor snack and midnight cravings Encouraged patient to increase level of activity with daily 30 minute walks  6. Elderly multigravida in third trimester Normal NIPS  Preterm labor symptoms and general obstetric precautions including but not limited to vaginal bleeding, contractions, leaking of fluid and fetal movement were reviewed in detail with the patient. Please refer to After Visit Summary for other counseling recommendations.  Return in about 1 week (around 05/21/2016) for ROB, NST.   Mora Bellman, MD

## 2016-05-14 NOTE — Progress Notes (Signed)
Patient is here for OB/NST. She was placed on the machine

## 2016-05-17 ENCOUNTER — Encounter (HOSPITAL_COMMUNITY): Payer: Self-pay

## 2016-05-17 ENCOUNTER — Ambulatory Visit (HOSPITAL_COMMUNITY)
Admission: RE | Admit: 2016-05-17 | Discharge: 2016-05-17 | Disposition: A | Discharge status: Home / Self Care | Payer: Medicaid Other | Source: Ambulatory Visit | Attending: Obstetrics and Gynecology | Admitting: Obstetrics and Gynecology

## 2016-05-17 DIAGNOSIS — O409XX0 Polyhydramnios, unspecified trimester, not applicable or unspecified: Secondary | ICD-10-CM

## 2016-05-17 DIAGNOSIS — O10013 Pre-existing essential hypertension complicating pregnancy, third trimester: Secondary | ICD-10-CM | POA: Diagnosis present

## 2016-05-17 DIAGNOSIS — O0990 Supervision of high risk pregnancy, unspecified, unspecified trimester: Secondary | ICD-10-CM

## 2016-05-17 DIAGNOSIS — O403XX0 Polyhydramnios, third trimester, not applicable or unspecified: Secondary | ICD-10-CM | POA: Insufficient documentation

## 2016-05-17 DIAGNOSIS — O24410 Gestational diabetes mellitus in pregnancy, diet controlled: Secondary | ICD-10-CM | POA: Diagnosis not present

## 2016-05-17 DIAGNOSIS — O99213 Obesity complicating pregnancy, third trimester: Secondary | ICD-10-CM | POA: Diagnosis not present

## 2016-05-17 DIAGNOSIS — O09523 Supervision of elderly multigravida, third trimester: Secondary | ICD-10-CM | POA: Insufficient documentation

## 2016-05-17 DIAGNOSIS — Z3A35 35 weeks gestation of pregnancy: Secondary | ICD-10-CM | POA: Insufficient documentation

## 2016-05-17 DIAGNOSIS — G8918 Other acute postprocedural pain: Secondary | ICD-10-CM

## 2016-05-17 DIAGNOSIS — O99210 Obesity complicating pregnancy, unspecified trimester: Secondary | ICD-10-CM

## 2016-05-17 DIAGNOSIS — O10919 Unspecified pre-existing hypertension complicating pregnancy, unspecified trimester: Secondary | ICD-10-CM

## 2016-05-17 IMAGING — US US MFM FETAL BPP W/O NON-STRESS
1 series · 12 of 28 positions shown · non-contrast
Comparison: none

[Series 1: us mfm fetal bpp w/o non-stress · 29 acquisitions, 12 frames shown]
[im 2/29]
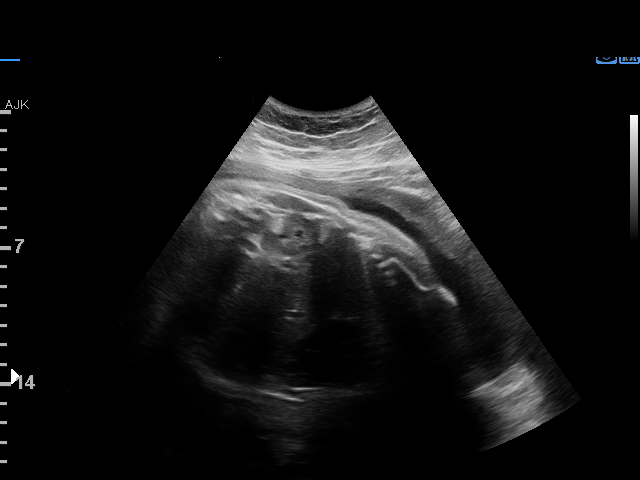
[im 4/29]
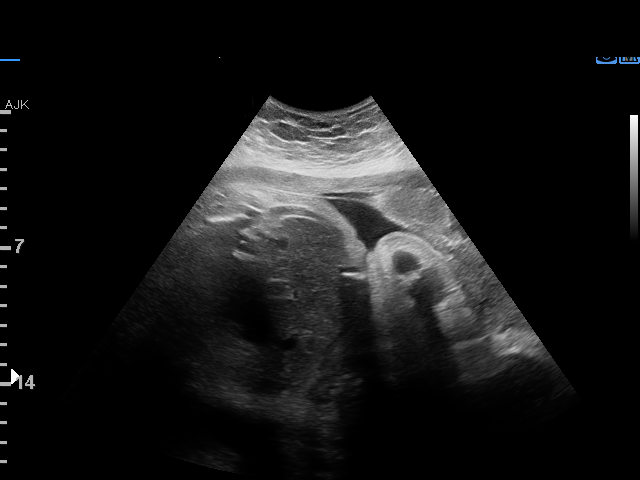
[im 6/29]
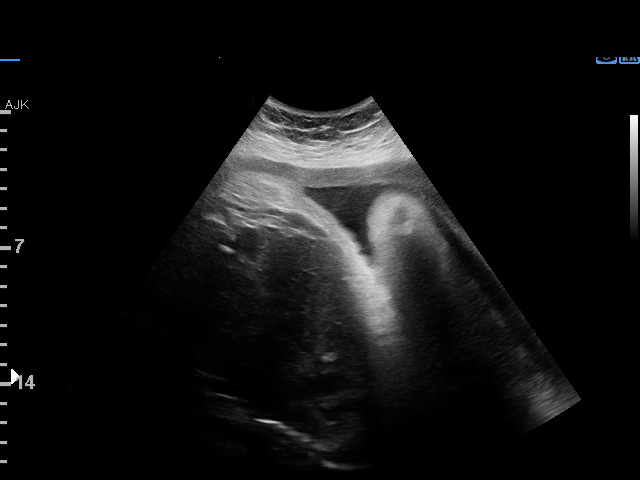
[im 9/29]
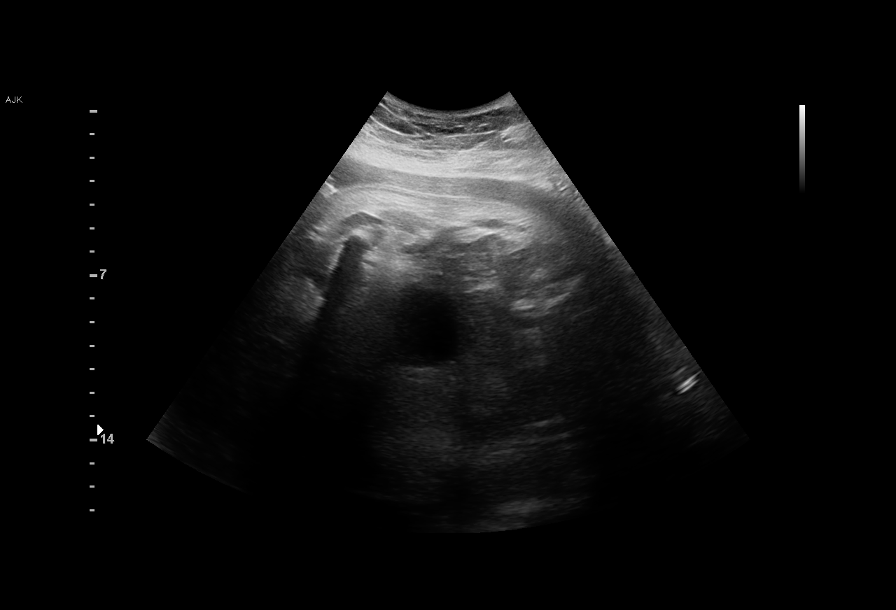
[im 11/29]
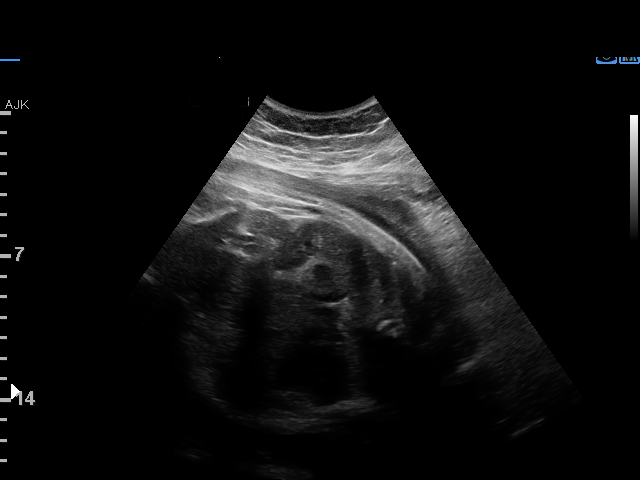
[im 13/29]
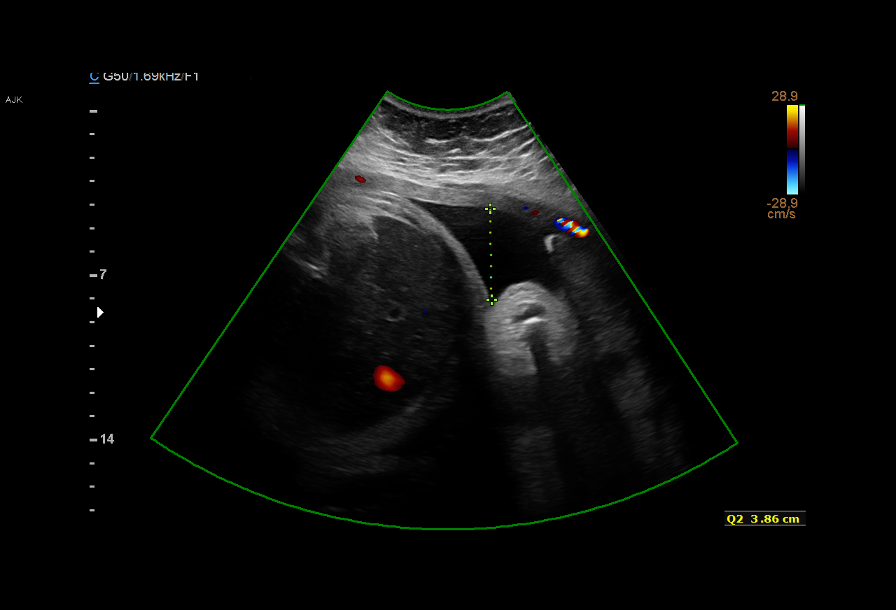
[im 16/29]
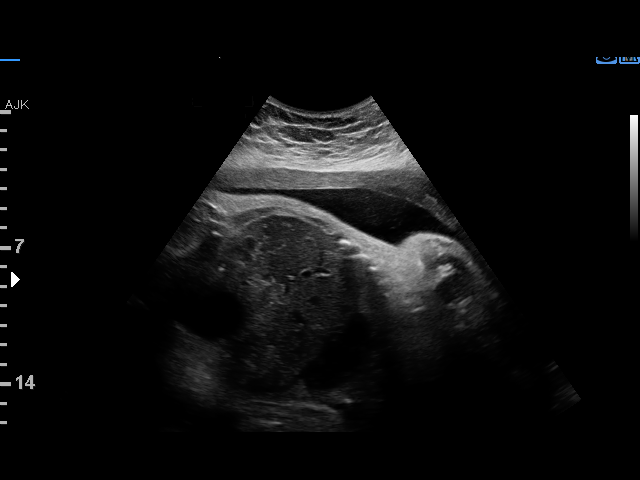
[im 18/29]
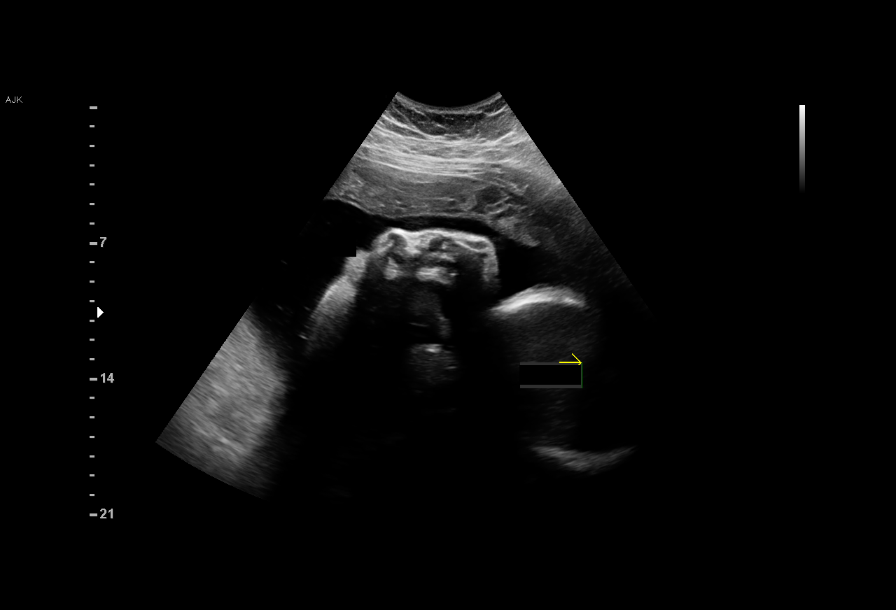
[im 20/29]
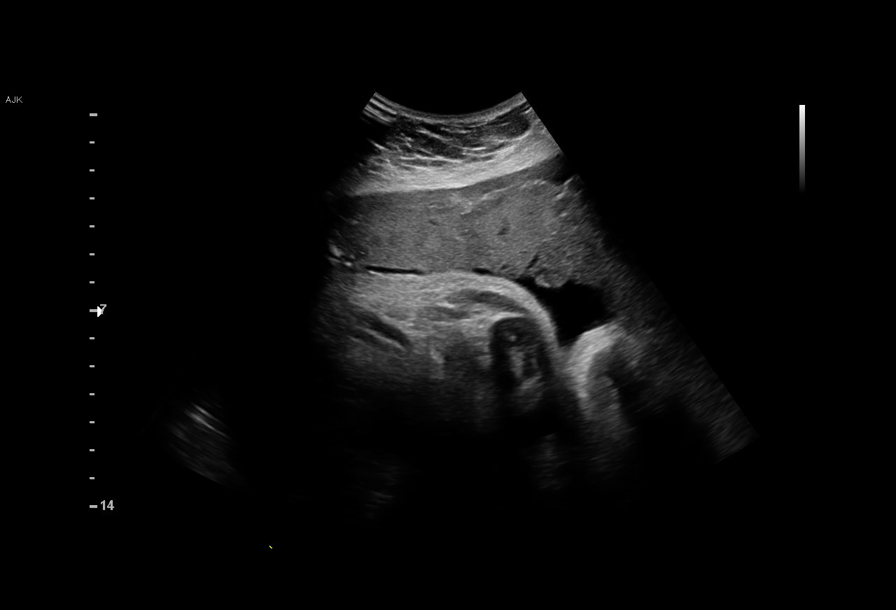
[im 23/29]
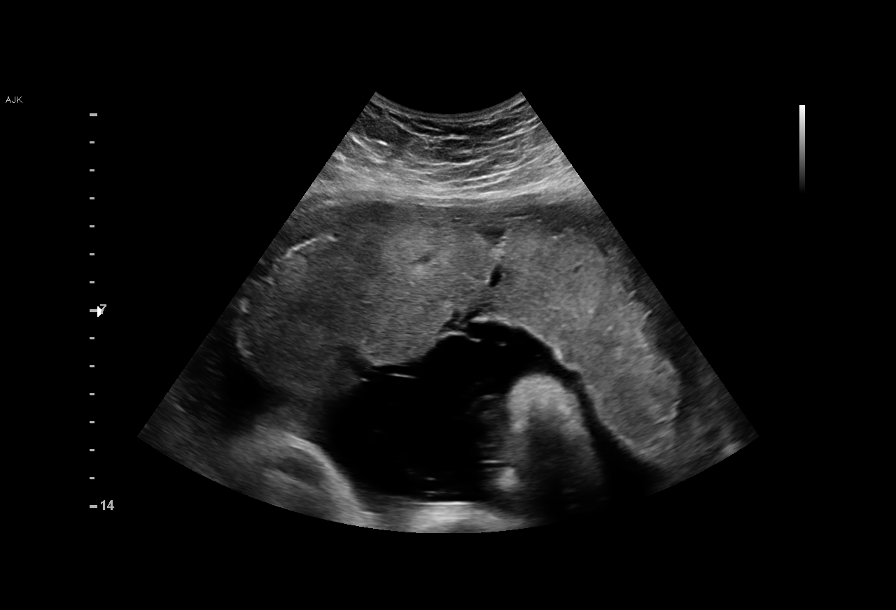
[im 25/29]
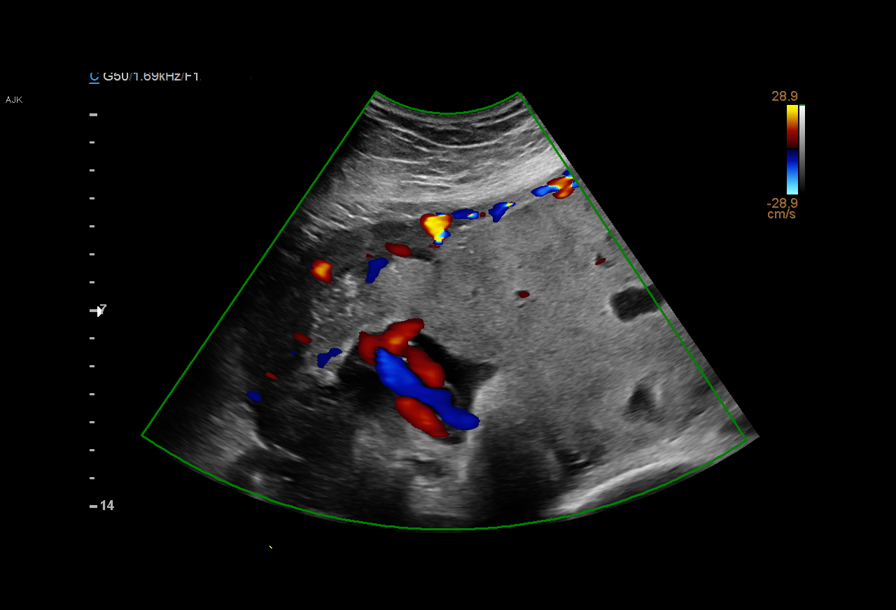
[im 27/29]
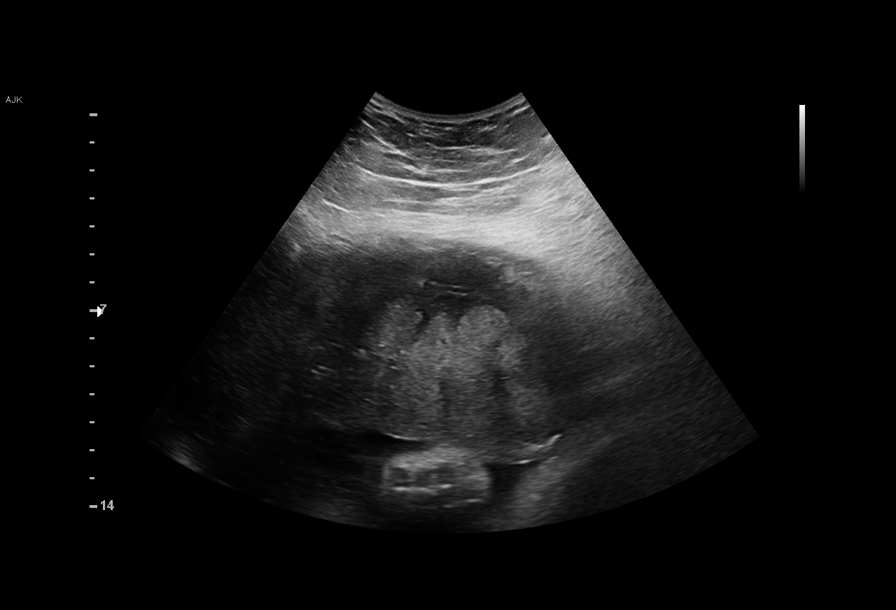

[12 of 28 positions shown; findings below may reference images not displayed]

Road [HOSPITAL]
Attending:        [facility]        Secondary Phy.:   [facility] Nursing-
MAU/Triage

1  [facility]              [PHONE_NUMBER]      [PHONE_NUMBER]     [PHONE_NUMBER]
Indications

35 weeks gestation of pregnancy
Ovarian cyst complicating pregnancy            [8Y]
(removed [DATE]); benign
Hypertension - Chronic/Pre-existing;           [8Y]
labetalol and ASA
Advanced maternal age multigravida 35+,        [8Y]
third trimester(low risk NIPS, neg quad
screen)
Polyhydramnios, third trimester, antepartum    [8Y]
condition or complication, unspecified fetus
Gestational diabetes in pregnancy, diet        [8Y]
controlled
Obesity complicating pregnancy, third          [8Y]
trimester
OB History

Blood Type:            Height:  5'3"   Weight (lb):  254       BMI:
Gravidity:    3         Term:   2        Prem:   0        SAB:   0
TOP:          0       Ectopic:  0        Living: 2
Fetal Evaluation

Num Of Fetuses:     1
Fetal Heart         154
Rate(bpm):
Cardiac Activity:   Observed
Presentation:       Cephalic
Placenta:           Anterior, above cervical os
P. Cord Insertion:  Visualized, central
Amniotic Fluid
AFI FV:      Subjectively within normal limits

AFI Sum(cm)     %Tile       Largest Pocket(cm)
18.58           69

RUQ(cm)       RLQ(cm)       LUQ(cm)        LLQ(cm)
7.49
Biophysical Evaluation

Amniotic F.V:   Within normal limits       F. Tone:        Observed
F. Movement:    Observed                   Score:          [DATE]
F. Breathing:   Observed
Gestational Age

LMP:           35w 2d        Date:  [DATE]                 EDD:   [DATE]
Best:          35w 2d     Det. By:  LMP  ([DATE])          EDD:   [DATE]
Impression

Single IUP at 35w 2d
CHTN, Obesity, Gestational diabetes, polyhydramnios
Cephalic presentation
BPP [DATE]
Normal amniotic fluid volume (AFI 18.6 cm)
Recommendations

Continue weekly BPPs, serial ultrasounds for growth.
Recommend delivery by 39 weeks in the absence of other
complications

## 2016-05-21 ENCOUNTER — Inpatient Hospital Stay (HOSPITAL_COMMUNITY): Payer: Medicaid Other | Admitting: Anesthesiology

## 2016-05-21 ENCOUNTER — Encounter: Payer: Medicaid Other | Admitting: Obstetrics and Gynecology

## 2016-05-21 ENCOUNTER — Inpatient Hospital Stay (HOSPITAL_COMMUNITY)
Admission: AD | Admit: 2016-05-21 | Discharge: 2016-05-24 | DRG: 774 | Disposition: A | Discharge status: Home / Self Care | Payer: Medicaid Other | Source: Ambulatory Visit | Attending: Obstetrics and Gynecology | Admitting: Obstetrics and Gynecology

## 2016-05-21 ENCOUNTER — Encounter (HOSPITAL_COMMUNITY): Payer: Self-pay | Admitting: *Deleted

## 2016-05-21 DIAGNOSIS — O403XX0 Polyhydramnios, third trimester, not applicable or unspecified: Secondary | ICD-10-CM | POA: Diagnosis not present

## 2016-05-21 DIAGNOSIS — O3413 Maternal care for benign tumor of corpus uteri, third trimester: Secondary | ICD-10-CM | POA: Diagnosis present

## 2016-05-21 DIAGNOSIS — D259 Leiomyoma of uterus, unspecified: Secondary | ICD-10-CM | POA: Diagnosis present

## 2016-05-21 DIAGNOSIS — Z3A35 35 weeks gestation of pregnancy: Secondary | ICD-10-CM

## 2016-05-21 DIAGNOSIS — O42919 Preterm premature rupture of membranes, unspecified as to length of time between rupture and onset of labor, unspecified trimester: Secondary | ICD-10-CM | POA: Diagnosis present

## 2016-05-21 DIAGNOSIS — O09523 Supervision of elderly multigravida, third trimester: Secondary | ICD-10-CM

## 2016-05-21 DIAGNOSIS — O24429 Gestational diabetes mellitus in childbirth, unspecified control: Secondary | ICD-10-CM | POA: Diagnosis not present

## 2016-05-21 DIAGNOSIS — O99210 Obesity complicating pregnancy, unspecified trimester: Secondary | ICD-10-CM

## 2016-05-21 DIAGNOSIS — O24425 Gestational diabetes mellitus in childbirth, controlled by oral hypoglycemic drugs: Secondary | ICD-10-CM | POA: Diagnosis present

## 2016-05-21 DIAGNOSIS — O0990 Supervision of high risk pregnancy, unspecified, unspecified trimester: Secondary | ICD-10-CM

## 2016-05-21 DIAGNOSIS — O10919 Unspecified pre-existing hypertension complicating pregnancy, unspecified trimester: Secondary | ICD-10-CM

## 2016-05-21 DIAGNOSIS — O1002 Pre-existing essential hypertension complicating childbirth: Secondary | ICD-10-CM | POA: Diagnosis present

## 2016-05-21 DIAGNOSIS — Z87891 Personal history of nicotine dependence: Secondary | ICD-10-CM

## 2016-05-21 DIAGNOSIS — O42013 Preterm premature rupture of membranes, onset of labor within 24 hours of rupture, third trimester: Secondary | ICD-10-CM | POA: Diagnosis not present

## 2016-05-21 DIAGNOSIS — G8918 Other acute postprocedural pain: Secondary | ICD-10-CM

## 2016-05-21 DIAGNOSIS — O42913 Preterm premature rupture of membranes, unspecified as to length of time between rupture and onset of labor, third trimester: Secondary | ICD-10-CM | POA: Diagnosis present

## 2016-05-21 LAB — CBC
HCT: 37.6 % (ref 36.0–46.0)
Hemoglobin: 12.7 g/dL (ref 12.0–15.0)
MCH: 25.9 pg — ABNORMAL LOW (ref 26.0–34.0)
MCHC: 33.8 g/dL (ref 30.0–36.0)
MCV: 76.7 fL — ABNORMAL LOW (ref 78.0–100.0)
PLATELETS: 210 10*3/uL (ref 150–400)
RBC: 4.9 MIL/uL (ref 3.87–5.11)
RDW: 17.6 % — AB (ref 11.5–15.5)
WBC: 12.6 10*3/uL — AB (ref 4.0–10.5)

## 2016-05-21 LAB — TYPE AND SCREEN
ABO/RH(D): B POS
ANTIBODY SCREEN: NEGATIVE

## 2016-05-21 LAB — POCT FERN TEST: POCT FERN TEST: POSITIVE

## 2016-05-21 LAB — GLUCOSE, CAPILLARY
GLUCOSE-CAPILLARY: 91 mg/dL (ref 65–99)
Glucose-Capillary: 86 mg/dL (ref 65–99)

## 2016-05-21 LAB — GLUCOSE, RANDOM: GLUCOSE: 79 mg/dL (ref 65–99)

## 2016-05-21 MED ORDER — LACTATED RINGERS IV SOLN
INTRAVENOUS | Status: DC
  Administered 2016-05-21: 1000 mL via INTRAVENOUS
  Administered 2016-05-21: 13:00:00 via INTRAVENOUS

## 2016-05-21 MED ORDER — DIPHENHYDRAMINE HCL 50 MG/ML IJ SOLN: 12.5000 mg | INTRAMUSCULAR | Status: DC | PRN

## 2016-05-21 MED ORDER — SOD CITRATE-CITRIC ACID 500-334 MG/5ML PO SOLN: 30.0000 mL | ORAL | Status: DC | PRN

## 2016-05-21 MED ORDER — ONDANSETRON HCL 4 MG/2ML IJ SOLN
4.0000 mg | Freq: Four times a day (QID) | INTRAMUSCULAR | Status: DC | PRN
  Administered 2016-05-21: 4 mg via INTRAVENOUS
  Filled 2016-05-21: qty 2

## 2016-05-21 MED ORDER — OXYTOCIN 40 UNITS IN LACTATED RINGERS INFUSION - SIMPLE MED
1.0000 m[IU]/min | INTRAVENOUS | Status: DC
  Administered 2016-05-21: 2 m[IU]/min via INTRAVENOUS
  Filled 2016-05-21: qty 1000

## 2016-05-21 MED ORDER — FENTANYL 2.5 MCG/ML BUPIVACAINE 1/10 % EPIDURAL INFUSION (WH - ANES)
14.0000 mL/h | INTRAMUSCULAR | Status: DC | PRN
  Administered 2016-05-21 (×3): 14 mL/h via EPIDURAL
  Filled 2016-05-21: qty 100

## 2016-05-21 MED ORDER — PHENYLEPHRINE 40 MCG/ML (10ML) SYRINGE FOR IV PUSH (FOR BLOOD PRESSURE SUPPORT)
80.0000 ug | PREFILLED_SYRINGE | INTRAVENOUS | Status: DC | PRN
  Filled 2016-05-21: qty 5

## 2016-05-21 MED ORDER — ACETAMINOPHEN 325 MG PO TABS
650.0000 mg | ORAL_TABLET | ORAL | Status: DC | PRN
  Filled 2016-05-21 (×3): qty 2

## 2016-05-21 MED ORDER — FENTANYL 2.5 MCG/ML BUPIVACAINE 1/10 % EPIDURAL INFUSION (WH - ANES)
INTRAMUSCULAR | Status: AC
  Filled 2016-05-21: qty 100

## 2016-05-21 MED ORDER — FENTANYL CITRATE (PF) 100 MCG/2ML IJ SOLN: 50.0000 ug | INTRAMUSCULAR | Status: DC | PRN

## 2016-05-21 MED ORDER — FLEET ENEMA 7-19 GM/118ML RE ENEM: 1.0000 | ENEMA | RECTAL | Status: DC | PRN

## 2016-05-21 MED ORDER — BETAMETHASONE SOD PHOS & ACET 6 (3-3) MG/ML IJ SUSP
12.0000 mg | Freq: Once | INTRAMUSCULAR | Status: DC
  Filled 2016-05-21: qty 2

## 2016-05-21 MED ORDER — TERBUTALINE SULFATE 1 MG/ML IJ SOLN
0.2500 mg | Freq: Once | INTRAMUSCULAR | Status: DC | PRN
  Filled 2016-05-21: qty 1

## 2016-05-21 MED ORDER — LIDOCAINE HCL (PF) 1 % IJ SOLN
INTRAMUSCULAR | Status: DC | PRN
  Administered 2016-05-21: 4 mL via EPIDURAL

## 2016-05-21 MED ORDER — LACTATED RINGERS IV SOLN: 500.0000 mL | Freq: Once | INTRAVENOUS | Status: DC

## 2016-05-21 MED ORDER — OXYTOCIN 40 UNITS IN LACTATED RINGERS INFUSION - SIMPLE MED: 2.5000 [IU]/h | INTRAVENOUS | Status: DC

## 2016-05-21 MED ORDER — OXYCODONE-ACETAMINOPHEN 5-325 MG PO TABS: 2.0000 | ORAL_TABLET | ORAL | Status: DC | PRN

## 2016-05-21 MED ORDER — BETAMETHASONE SOD PHOS & ACET 6 (3-3) MG/ML IJ SUSP
12.0000 mg | INTRAMUSCULAR | Status: DC
Start: 2016-05-21 — End: 2016-05-21
  Administered 2016-05-21: 12 mg via INTRAMUSCULAR
  Filled 2016-05-21 (×2): qty 2

## 2016-05-21 MED ORDER — PHENYLEPHRINE 40 MCG/ML (10ML) SYRINGE FOR IV PUSH (FOR BLOOD PRESSURE SUPPORT)
80.0000 ug | PREFILLED_SYRINGE | INTRAVENOUS | Status: DC | PRN
  Administered 2016-05-21: 80 ug via INTRAVENOUS

## 2016-05-21 MED ORDER — LACTATED RINGERS IV SOLN: 500.0000 mL | INTRAVENOUS | Status: DC | PRN

## 2016-05-21 MED ORDER — BETAMETHASONE SOD PHOS & ACET 6 (3-3) MG/ML IJ SUSP: 12.0000 mg | INTRAMUSCULAR | Status: DC

## 2016-05-21 MED ORDER — PENICILLIN G POT IN DEXTROSE 60000 UNIT/ML IV SOLN
3.0000 10*6.[IU] | INTRAVENOUS | Status: DC
  Administered 2016-05-21: 3 10*6.[IU] via INTRAVENOUS
  Filled 2016-05-21 (×15): qty 50

## 2016-05-21 MED ORDER — EPHEDRINE 5 MG/ML INJ
10.0000 mg | INTRAVENOUS | Status: DC | PRN
  Filled 2016-05-21: qty 4

## 2016-05-21 MED ORDER — OXYCODONE-ACETAMINOPHEN 5-325 MG PO TABS
1.0000 | ORAL_TABLET | ORAL | Status: DC | PRN
  Filled 2016-05-21 (×2): qty 1

## 2016-05-21 MED ORDER — PHENYLEPHRINE 40 MCG/ML (10ML) SYRINGE FOR IV PUSH (FOR BLOOD PRESSURE SUPPORT)
PREFILLED_SYRINGE | INTRAVENOUS | Status: AC
  Filled 2016-05-21: qty 20

## 2016-05-21 MED ORDER — PENICILLIN G POTASSIUM 5000000 UNITS IJ SOLR
5.0000 10*6.[IU] | Freq: Once | INTRAVENOUS | Status: AC
  Administered 2016-05-21: 5 10*6.[IU] via INTRAVENOUS
  Filled 2016-05-21: qty 5

## 2016-05-21 MED ORDER — OXYTOCIN BOLUS FROM INFUSION
500.0000 mL | Freq: Once | INTRAVENOUS | Status: AC
  Administered 2016-05-22: 500 mL via INTRAVENOUS

## 2016-05-21 MED ORDER — EPHEDRINE 5 MG/ML INJ
10.0000 mg | INTRAVENOUS | Status: DC | PRN
Start: 2016-05-21 — End: 2016-05-21

## 2016-05-21 MED ORDER — LIDOCAINE HCL (PF) 1 % IJ SOLN
30.0000 mL | INTRAMUSCULAR | Status: DC | PRN
  Filled 2016-05-21: qty 30

## 2016-05-21 NOTE — Anesthesia Pain Management Evaluation Note (Signed)
  CRNA Pain Management Visit Note  Patient: Nichole Green, 36 y.o., female  "Hello I am a member of the anesthesia team at [facility]. We have an anesthesia team available at all times to provide care throughout the hospital, including epidural management and anesthesia for C-section. I don't know your plan for the delivery whether it a natural birth, water birth, IV sedation, nitrous supplementation, doula or epidural, but we want to meet your pain goals."   1.Was your pain managed to your expectations on prior hospitalizations?   Yes   2.What is your expectation for pain management during this hospitalization?     Labor support without medications  3.How can we help you reach that goal?   Record the patient's initial score and the patient's pain goal.   Pain: 5  Pain Goal: 7 [facility] wants you to be able to say your pain was always managed very well.  Jabier Mutton 05/21/2016

## 2016-05-21 NOTE — MAU Note (Signed)
Watery d/c noted about 30 min ago.  Been having pain and pressure in lower abd off and on,last few days.

## 2016-05-21 NOTE — MAU Note (Signed)
Pt presents to MAU for ROM pt states her water broke at 1041. Upon assessment pt has blood tinged fluid. Pt states baby was moving this morning.

## 2016-05-21 NOTE — H&P (Signed)
LABOR AND DELIVERY ADMISSION HISTORY AND PHYSICAL NOTE  Nichole Green is a 36 y.o. female G59P2002 with IUP at [redacted]w[redacted]d by LMP and [place] presenting for PPROM in the context of GDM, polyhydramnios has resolved.  Patient states around 10:30am this morning, she noticed large amounts of fluid per vagina, continually leaking.   She reports positive fetal movement. She denies vaginal bleeding.  Prenatal History/Complications: Gestational diabetes mellitus  (EFW at the 83rd %tile; AC >97th %tile @[redacted]w[redacted]d ) Polyhydramnios (resolved)Chronic hypertension Advanced maternal age Obesity Uterine fibroids  Past Medical History: Past Medical History:  Diagnosis Date  . Headache    only when B/P elevated   . Hypertension   . Infection    UTI  . Pregnancy induced hypertension     Past Surgical History: Past Surgical History:  Procedure Laterality Date  . BREAST REDUCTION SURGERY    . ROBOTIC ASSISTED LAPAROSCOPIC OVARIAN CYSTECTOMY N/A 01/17/2016   Procedure: XI ROBOTIC ASSISTED LAPAROSCOPIC OVARIAN CYSTECTOMY;  Surgeon: Everitt Amber, MD;  Location: [facility];  Service: Gynecology;  Laterality: N/A;    Obstetrical History: OB History    Gravida Para Term Preterm AB Living   3 2 2  0 0 2   SAB TAB Ectopic Multiple Live Births   0 0 0 0 2      Obstetric Comments   Elevated BP with preg      Social History: Social History   Social History  . Marital status: Married    Spouse name: N/A  . Number of children: N/A  . Years of education: N/A   Social History Main Topics  . Smoking status: Former Smoker    Years: 2.00    Types: Cigarettes    Quit date: 10/25/2015  . Smokeless tobacco: Never Used     Comment: hookah  . Alcohol use No  . Drug use: No  . Sexual activity: Yes    Birth control/ protection: None   Other Topics Concern  . Not on file   Social History Narrative  . No narrative on file    Family History: Family History  Problem Relation Age of Onset  . Hypertension Mother   .  Diabetes Mother     Allergies: No Known Allergies  Prescriptions Prior to Admission  Medication Sig Dispense Refill Last Dose  . glyBURIDE (DIABETA) 2.5 MG tablet Take 1 tablet (2.5 mg total) by mouth 2 (two) times daily with a meal. (Patient taking differently: Take 2.5-5 mg by mouth 2 (two) times daily with a meal. 1 tablet in the morning and 2 tablets at dinner) 60 tablet 3 05/21/2016 at Unknown time  . labetalol (NORMODYNE) 100 MG tablet Take 1 tablet (100 mg total) by mouth 2 (two) times daily. 60 tablet 2 05/21/2016 at 0630  . oxymetazoline (AFRIN) 0.05 % nasal spray Place 1 spray into both nostrils 2 (two) times daily as needed for congestion.   05/20/2016 at Unknown time  . Prenatal Vit-Fe Fumarate-FA (PRENATAL MULTIVITAMIN) TABS tablet Take 1 tablet by mouth daily at 12 noon. (Patient taking differently: Take 1 tablet by mouth daily at 12 noon. ) 30 tablet 12 05/20/2016 at Unknown time  . ACCU-CHEK FASTCLIX LANCETS MISC Check blood glucose four times daily 100 each 5 Taking  . aspirin EC 81 MG tablet Take 1 tablet (81 mg total) by mouth daily. (Patient not taking: Reported on 04/05/2016) 30 tablet 6 Not Taking at Unknown time  . glucose blood (ACCU-CHEK GUIDE) test strip Check blood glucose four times daily 100  each 5      Review of Systems   All systems reviewed and negative except as stated in HPI  Blood pressure 132/80, pulse 98, temperature 98.4 F (36.9 C), temperature source Oral, resp. rate 20, weight 275 lb 8 oz (125 kg), last menstrual period 09/13/2015. General appearance: alert, cooperative and no distress Lungs: clear to auscultation bilaterally Heart: regular rate and rhythm Abdomen: soft, non-tender; bowel sounds normal Extremities: No calf swelling or tenderness Presentation: cephalic Fetal monitoring: FHR 145 bpm, moderate variability, accelerations present, no decelerations Uterine activity: irregular Dilation: 2 Effacement (%): Thick Exam by:: Dr.  Vanetta Shawl   Prenatal labs: ABO, Rh: --/--/B POS (11/11 1636) Antibody: NEG (11/11 1636) Rubella: Immune RPR: Non Reactive (01/09 1043)  HBsAg: Negative (09/19 1123)  HIV: Non Reactive (01/09 1043)  GBS: Not on file, will obtain and treat due to preterm 2 hr Glucola: Abnormal; 105/204/114 Genetic screening:  Normal Anatomy US: Normal  Prenatal Transfer Tool  Maternal Diabetes: Yes:  Diabetes Type:  Insulin/Medication controlled Genetic Screening: Normal Maternal Ultrasounds/Referrals: Normal Fetal Ultrasounds or other Referrals:  None Maternal Substance Abuse:  No Significant Maternal Medications:  Meds include: Other: Labetalol 100mg  BID; glyburide 2.5mg  BID Significant Maternal Lab Results: Lab values include: Other: GBS unknown, will treat, culture taken and is pending  Results for orders placed or performed during the hospital encounter of 05/21/16 (from the past 24 hour(s))  [facility] Time: 05/21/16 12:30 PM  Result Value Ref Range   POCT Fern Test Positive = ruptured amniotic membanes     Patient Active Problem List   Diagnosis Date Noted  . GDM (gestational diabetes mellitus) 04/09/2016  . Polyhydramnios affecting pregnancy in third trimester 04/03/2016  . Uterine fibroid 02/09/2016  . Post-operative pain 02/05/2016  . Abdominal pain affecting pregnancy   . Preterm uterine contractions 02/04/2016  . Obesity affecting pregnancy, antepartum 12/13/2015  . Supervision of high risk pregnancy, antepartum 12/11/2015  . AMA (advanced maternal age) multigravida 35+ 10/31/2015  . Chronic hypertension during pregnancy, antepartum 10/31/2015    Assessment: Nichole Green is a 36 y.o. G3P2002 at [redacted]w[redacted]d here for PPROM. No signs of cord prolapse and reassuring fetal strip. Given prematurity, will need BMZ.  #Labor:SOL/PPROM, will titrate pitocin to achieve adequate labor #Pain: Epidural #FWB: Category I #ID:  GBS not on file, Cx obtain, starting PCN given PPROM #MOF:  Both #MOC:Mirena #Circ:  Yes outpatient #PPROM: BMZ x2 (24 hours apart) #GDM: monitor CBG q4h with insulin for >120 #CHTN: Labetalol 100mg  BID, monitor, no si/sx of [place], DO, PGY-1 05/21/2016, 12:54 PM   OB FELLOW HISTORY AND PHYSICAL ATTESTATION  I have seen and examined this patient; I agree with above documentation in the resident's note. No signs/symptoms of preE. BP controlled. CBG normal, monitor q4h. BMZ given, second dose in 24 hours if still pregnant. GBS ppx to be given, cultures taken.   Katherine Basset, DO [place] Fellow 05/21/2016

## 2016-05-21 NOTE — Anesthesia Preprocedure Evaluation (Signed)
Anesthesia Evaluation  Patient identified by MRN, date of birth, ID band Patient awake    Reviewed: Allergy & Precautions, Patient's Chart, lab work & pertinent test results, reviewed documented beta blocker date and time   Airway Mallampati: III       Dental   Pulmonary former smoker,    breath sounds clear to auscultation       Cardiovascular hypertension, Pt. on medications and Pt. on home beta blockers  Rhythm:Regular Rate:Normal     Neuro/Psych  Headaches, negative psych ROS   GI/Hepatic negative GI ROS, Neg liver ROS,   Endo/Other  diabetes, Type 2, Oral Hypoglycemic Agents  Renal/GU negative Renal ROS  negative genitourinary   Musculoskeletal negative musculoskeletal ROS (+)   Abdominal   Peds negative pediatric ROS (+)  Hematology negative hematology ROS (+)   Anesthesia Other Findings   Reproductive/Obstetrics (+) Pregnancy                             Lab Results  Component Value Date   WBC 12.6 (H) 05/21/2016   HGB 12.7 05/21/2016   HCT 37.6 05/21/2016   MCV 76.7 (L) 05/21/2016   PLT 210 05/21/2016     Anesthesia Physical Anesthesia Plan  ASA: III  Anesthesia Plan: Epidural   Post-op Pain Management:    Induction:   Airway Management Planned:   Additional Equipment:   Intra-op Plan:   Post-operative Plan:   Informed Consent: I have reviewed the patients History and Physical, chart, labs and discussed the procedure including the risks, benefits and alternatives for the proposed anesthesia with the patient or authorized representative who has indicated his/her understanding and acceptance.     Plan Discussed with:   Anesthesia Plan Comments:         Anesthesia Quick Evaluation

## 2016-05-21 NOTE — Anesthesia Procedure Notes (Signed)
Epidural Patient location during procedure: OB Start time: 05/21/2016 4:23 PM End time: 05/21/2016 4:30 PM  Staffing Anesthesiologist: Suella Broad D Performed: anesthesiologist   Preanesthetic Checklist Completed: patient identified, site marked, surgical consent, pre-op evaluation, timeout performed, IV checked, risks and benefits discussed and monitors and equipment checked  Epidural Patient position: sitting Prep: ChloraPrep Patient monitoring: heart rate, continuous pulse ox and blood pressure Approach: midline Location: L3-L4 Injection technique: LOR saline  Needle:  Needle type: Tuohy  Needle gauge: 17 G Needle length: 9 cm Catheter type: closed end flexible Catheter size: 20 Guage Test dose: negative and 1.5% lidocaine  Assessment Events: blood not aspirated, injection not painful, no injection resistance and no paresthesia  Additional Notes LOR @ 8  Patient identified. Risks/Benefits/Options discussed with patient including but not limited to bleeding, infection, nerve damage, paralysis, failed block, incomplete pain control, headache, blood pressure changes, nausea, vomiting, reactions to medications, itching and postpartum back pain. Confirmed with bedside nurse the patient's most recent platelet count. Confirmed with patient that they are not currently taking any anticoagulation, have any bleeding history or any family history of bleeding disorders. Patient expressed understanding and wished to proceed. All questions were answered. Sterile technique was used throughout the entire procedure. Please see nursing notes for vital signs. Test dose was given through epidural catheter and negative prior to continuing to dose epidural or start infusion. Warning signs of high block given to the patient including shortness of breath, tingling/numbness in hands, complete motor block, or any concerning symptoms with instructions to call for help. Patient was given instructions on fall  risk and not to get out of bed. All questions and concerns addressed with instructions to call with any issues or inadequate analgesia.    Reason for block:procedure for pain

## 2016-05-21 NOTE — Progress Notes (Signed)
1350 Discussed pitocin with Dr Vanetta Shawl, will hold and start if u/c's decrease

## 2016-05-22 ENCOUNTER — Encounter (HOSPITAL_COMMUNITY): Payer: Self-pay

## 2016-05-22 DIAGNOSIS — O42013 Preterm premature rupture of membranes, onset of labor within 24 hours of rupture, third trimester: Secondary | ICD-10-CM

## 2016-05-22 DIAGNOSIS — O24429 Gestational diabetes mellitus in childbirth, unspecified control: Secondary | ICD-10-CM

## 2016-05-22 DIAGNOSIS — O403XX0 Polyhydramnios, third trimester, not applicable or unspecified: Secondary | ICD-10-CM

## 2016-05-22 DIAGNOSIS — Z3A35 35 weeks gestation of pregnancy: Secondary | ICD-10-CM

## 2016-05-22 LAB — RPR: RPR: NONREACTIVE

## 2016-05-22 MED ORDER — OXYCODONE-ACETAMINOPHEN 5-325 MG PO TABS: 2.0000 | ORAL_TABLET | ORAL | Status: DC | PRN

## 2016-05-22 MED ORDER — SIMETHICONE 80 MG PO CHEW: 80.0000 mg | CHEWABLE_TABLET | ORAL | Status: DC | PRN

## 2016-05-22 MED ORDER — PRENATAL MULTIVITAMIN CH
1.0000 | ORAL_TABLET | Freq: Every day | ORAL | Status: DC
  Administered 2016-05-22 – 2016-05-23 (×2): 1 via ORAL
  Filled 2016-05-22 (×2): qty 1

## 2016-05-22 MED ORDER — BENZOCAINE-MENTHOL 20-0.5 % EX AERO: 1.0000 "application " | INHALATION_SPRAY | CUTANEOUS | Status: DC | PRN

## 2016-05-22 MED ORDER — ONDANSETRON HCL 4 MG/2ML IJ SOLN: 4.0000 mg | INTRAMUSCULAR | Status: DC | PRN

## 2016-05-22 MED ORDER — WITCH HAZEL-GLYCERIN EX PADS: 1.0000 "application " | MEDICATED_PAD | CUTANEOUS | Status: DC | PRN

## 2016-05-22 MED ORDER — DIBUCAINE 1 % RE OINT: 1.0000 "application " | TOPICAL_OINTMENT | RECTAL | Status: DC | PRN

## 2016-05-22 MED ORDER — SENNOSIDES-DOCUSATE SODIUM 8.6-50 MG PO TABS
2.0000 | ORAL_TABLET | ORAL | Status: DC
  Administered 2016-05-22 – 2016-05-23 (×2): 2 via ORAL
  Filled 2016-05-22 (×2): qty 2

## 2016-05-22 MED ORDER — IBUPROFEN 600 MG PO TABS
600.0000 mg | ORAL_TABLET | Freq: Four times a day (QID) | ORAL | Status: DC
  Administered 2016-05-22 – 2016-05-24 (×10): 600 mg via ORAL
  Filled 2016-05-22 (×11): qty 1

## 2016-05-22 MED ORDER — OXYCODONE-ACETAMINOPHEN 5-325 MG PO TABS
1.0000 | ORAL_TABLET | ORAL | Status: DC | PRN
  Administered 2016-05-24 (×2): 1 via ORAL

## 2016-05-22 MED ORDER — TETANUS-DIPHTH-ACELL PERTUSSIS 5-2.5-18.5 LF-MCG/0.5 IM SUSP: 0.5000 mL | Freq: Once | INTRAMUSCULAR | Status: DC

## 2016-05-22 MED ORDER — ACETAMINOPHEN 325 MG PO TABS
650.0000 mg | ORAL_TABLET | ORAL | Status: DC | PRN
Start: 2016-05-22 — End: 2016-05-24
  Administered 2016-05-22 – 2016-05-23 (×4): 650 mg via ORAL
  Filled 2016-05-22: qty 2

## 2016-05-22 MED ORDER — LABETALOL HCL 100 MG PO TABS
100.0000 mg | ORAL_TABLET | Freq: Two times a day (BID) | ORAL | Status: DC
  Administered 2016-05-22 (×2): 100 mg via ORAL
  Filled 2016-05-22: qty 1

## 2016-05-22 MED ORDER — ONDANSETRON HCL 4 MG PO TABS: 4.0000 mg | ORAL_TABLET | ORAL | Status: DC | PRN

## 2016-05-22 MED ORDER — DIPHENHYDRAMINE HCL 25 MG PO CAPS: 25.0000 mg | ORAL_CAPSULE | Freq: Four times a day (QID) | ORAL | Status: DC | PRN

## 2016-05-22 MED ORDER — COCONUT OIL OIL: 1.0000 "application " | TOPICAL_OIL | Status: DC | PRN

## 2016-05-22 NOTE — Anesthesia Postprocedure Evaluation (Signed)
Anesthesia Post Note  Patient: Nichole Green  Procedure(s) Performed: * No procedures listed *  Patient location during evaluation: Mother Baby Anesthesia Type: Epidural Level of consciousness: awake Pain management: pain level controlled Vital Signs Assessment: post-procedure vital signs reviewed and stable Respiratory status: spontaneous breathing Cardiovascular status: stable Postop Assessment: no headache, no backache, epidural receding, adequate PO intake, no signs of nausea or vomiting and patient able to bend at knees Anesthetic complications: no        Last Vitals:  Vitals:   05/22/16 0324 05/22/16 0420  BP: 120/73 (!) 111/59  Pulse: (!) 126 (!) 122  Resp: 18 18  Temp: 36.7 C 37.2 C    Last Pain:  Vitals:   05/22/16 0725  TempSrc:   PainSc: 4    Pain Goal: Patients Stated Pain Goal: 0 (05/21/16 2300)               [name]

## 2016-05-22 NOTE — Lactation Note (Addendum)
This note was copied from a baby's chart. Lactation Consultation Note Initial visit at 9 hours of age.  Baby is [redacted]w[redacted]d and 7#0oz after 9 hour re weigh for baby.  Mom has 2 older children she breastfed.  Her oldest is 86 and breast fed exclusively for 1 year and her second is 8 and she breast fed for "40 days."  Mom reports wanting to breast feed this baby for 1 month and then will give formula.  Mom has had a breast reduction surgery, 4 years ago, since breastfeeding her older children. Mom reports nipple was removed with T shaped incision at chest base of breast with noted scaring.  Mom reports having 3 1/2 pounds removed from each breast.  Mom has very large soft breasts. LC discussed late preterm feeding with mom.  Mom reports she doesn't like the pump and declines assist.  LC encouraged pumping as a good way to increase milk production with history of surgery and LPT baby.  LC offered to assist with hand expression with several drops easily expressed from left breast. LC encouraged mom to return demonstration and mom appears uncomfortable and reports she doesn't want to hand express.  LC encouraged mom if she tries later she can spoon feed her EBM to baby with RN assist.  LC respects mom choice to not pump or hand express and discussed need to supplement formula if she doesn't have expressed milk and baby is not latching well.  Mom agreeable to supplementing with formula.   LC offered to assist mom with latching baby. Last feeding was >3 hours ago and only a few minutes.  Mom agreeable and attempts cradle hold.  Baby licks and mouths breast, but does not latch.  LC offered to assist with cross cradle position and football hold and mom declines.  LC encouraged mom to hold baby STS and mom continues to hold baby away from her.  LC provided a blanket for warmth. LC reviewed LPT policy parent information sheet at bedside with parents.   [facility] LC resources given and discussed.  Encouraged to feed with early cues on  demand. Parents aware to call RN if baby is not waking for feedings.  Early newborn behavior discussed.   Mom to call for assist as needed.  LC reported to RN that baby will need to be formula fed by bottle per moms wishes and likely will continue bottle supplements with formula due to moms breastfeeding goals.    Patient Name: Nichole Green [id number] Date: 05/22/2016 Reason for consult: Initial assessment;Late preterm infant;Breast surgery   Maternal Data Has patient been taught Hand Expression?: Yes Does the patient have breastfeeding experience prior to this delivery?: Yes  Feeding Feeding Type: Breast Fed Length of feed: 5 min  LATCH Score/Interventions Latch: Too sleepy or reluctant, no latch achieved, no sucking elicited. Intervention(s): Assist with latch;Adjust position;Breast massage;Breast compression  Audible Swallowing: None  Type of Nipple: Everted at rest and after stimulation  Comfort (Breast/Nipple): Soft / non-tender     Hold (Positioning): Assistance needed to correctly position infant at breast and maintain latch. Intervention(s): Breastfeeding basics reviewed;Support Pillows;Position options;Skin to skin  LATCH Score: 5  Lactation Tools Discussed/Used WIC Program: No Initiated by:: set up by RN, mom does not want to pump Date initiated:: 05/22/16   Consult Status Consult Status: PRN    Shoptaw, Justine Null 05/22/2016, 10:38 AM

## 2016-05-22 NOTE — Progress Notes (Signed)
Post Partum Day #0 Subjective: no complaints, up ad lib, voiding and tolerating PO  Objective: Blood pressure (!) 111/59, pulse (!) 122, temperature 98.9 F (37.2 C), temperature source Oral, resp. rate 18, weight 275 lb 8 oz (125 kg), last menstrual period 09/13/2015, SpO2 99 %, unknown if currently breastfeeding.  Physical Exam:  General: alert, cooperative and no distress Lochia: appropriate Uterine Fundus: firm Incision: none DVT Evaluation: No evidence of DVT seen on physical exam. No cords or calf tenderness. No significant calf/ankle edema.   Recent Labs  05/21/16 1252  HGB 12.7  HCT 37.6    Assessment/Plan: Breastfeeding and Contraception Mirena IUD planned.  Continue current care.  Monitor blood pressures.   LOS: 1 day   Morene Crocker, CNM 05/22/2016, 7:40 AM

## 2016-05-23 LAB — CULTURE, BETA STREP (GROUP B ONLY)

## 2016-05-23 MED ORDER — AMLODIPINE BESYLATE 10 MG PO TABS
10.0000 mg | ORAL_TABLET | Freq: Every day | ORAL | Status: DC
  Administered 2016-05-23 – 2016-05-24 (×2): 10 mg via ORAL
  Filled 2016-05-23 (×2): qty 1

## 2016-05-23 MED ORDER — AMLODIPINE BESYLATE 5 MG PO TABS
5.0000 mg | ORAL_TABLET | Freq: Every day | ORAL | Status: DC
  Filled 2016-05-23: qty 1

## 2016-05-23 NOTE — Progress Notes (Signed)
Post Partum Day #1 Subjective: no complaints, up ad lib, voiding and tolerating PO  Objective: Blood pressure 119/72, pulse 83, temperature 98.5 F (36.9 C), temperature source Oral, resp. rate 18, weight 275 lb 8 oz (125 kg), last menstrual period 09/13/2015, SpO2 98 %, unknown if currently breastfeeding.  Physical Exam:  General: alert, cooperative, no distress and morbidly obese Lochia: appropriate Uterine Fundus: firm Incision: .none DVT Evaluation: No evidence of DVT seen on physical exam. No cords or calf tenderness. No significant calf/ankle edema.   Recent Labs  05/21/16 1252  HGB 12.7  HCT 37.6    Assessment/Plan: Plan for discharge tomorrow, Breastfeeding and Contraception Mirena IUD planned.  Norvasc 10mg  started for increased blood pressures overnight, labetalol discontinued.    LOS: 2 days   Morene Crocker, CNM 05/23/2016, 8:16 AM

## 2016-05-23 NOTE — Lactation Note (Signed)
This note was copied from a baby's chart. Lactation Consultation Note Follow up visit at 83 hours of age.  Mom is formula and bottle feeding baby.  Mom denies any concerns about breast feeding and is happy with formula feeding baby.  Mom aware to call for assist as needed.    Patient Name: Nichole Green [id number] Date: 05/23/2016     Maternal Data    Feeding Feeding Type: Formula Nipple Type: Slow - flow  LATCH Score/Interventions                      Lactation Tools Discussed/Used     Consult Status      Michaela Shankel, Justine Null 05/23/2016, 6:16 PM

## 2016-05-24 ENCOUNTER — Ambulatory Visit (HOSPITAL_COMMUNITY): Payer: Medicaid Other

## 2016-05-24 MED ORDER — AMLODIPINE BESYLATE 10 MG PO TABS: 10.0000 mg | ORAL_TABLET | Freq: Every day | ORAL | 6 refills | Status: DC

## 2016-05-24 MED ORDER — SENNOSIDES-DOCUSATE SODIUM 8.6-50 MG PO TABS: 2.0000 | ORAL_TABLET | Freq: Every day | ORAL | 2 refills | Status: DC

## 2016-05-24 MED ORDER — OXYCODONE-ACETAMINOPHEN 5-325 MG PO TABS: 1.0000 | ORAL_TABLET | ORAL | 0 refills | Status: DC | PRN

## 2016-05-24 MED ORDER — IBUPROFEN 600 MG PO TABS: 600.0000 mg | ORAL_TABLET | Freq: Four times a day (QID) | ORAL | 2 refills | Status: DC

## 2016-05-24 NOTE — Progress Notes (Signed)
Post Partum Day #2 Subjective: no complaints, up ad lib, voiding, tolerating PO and has showered, feeding is going well  Objective: Blood pressure 129/77, pulse 77, temperature 98.1 F (36.7 C), resp. rate 18, weight 275 lb 8 oz (125 kg), last menstrual period 09/13/2015, SpO2 98 %, unknown if currently breastfeeding.  Physical Exam:  General: alert, cooperative and no distress Lochia: appropriate Uterine Fundus: firm Incision: none DVT Evaluation: No evidence of DVT seen on physical exam. No cords or calf tenderness. No significant calf/ankle edema.   Recent Labs  05/21/16 1252  HGB 12.7  HCT 37.6    Assessment/Plan: Discharge home, Breastfeeding and Contraception Mirena IUD planned   LOS: 3 days   Morene Crocker, CNM 05/24/2016, 7:53 AM

## 2016-05-24 NOTE — Discharge Summary (Signed)
OB Discharge Summary     Patient Name: Nichole Green DOB: 09/13/1980 MRN: [id number]  Date of admission: 05/21/2016 Delivering MD: Mora Bellman   Date of discharge: 05/24/2016  Admitting diagnosis: 17WKS, PAIN,DISCHARGE Intrauterine pregnancy: [redacted]w[redacted]d     Secondary diagnosis:  Active Problems:   Preterm premature rupture of membranes (PPROM) with unknown onset of labor  Additional problems: GDM, CHTN, polyhydramnios     Discharge diagnosis: Preterm Pregnancy Delivered, CHTN and GDM A2                                                                                                Post partum procedures:none  Augmentation: Pitocin  Complications: None  Hospital course:  Onset of Labor With Vaginal Delivery     36 y.o. yo [id number] at [redacted]w[redacted]d was admitted in Active Labor on 05/21/2016. Patient had an uncomplicated labor course as follows:  Membrane Rupture Time/Date: 10:00 AM ,05/21/2016   Intrapartum Procedures: Episiotomy: None [1]                                         Lacerations:  None [1]  Patient had a delivery of a Viable infant. 05/22/2016  Information for the patient's newborn:  Veralynn, Skubic [id number]  Delivery Method: Vaginal, Spontaneous Delivery (Filed from Delivery Summary)    Pateint had an uncomplicated postpartum course.  She is ambulating, tolerating a regular diet, passing flatus, and urinating well. Patient is discharged home in stable condition on 05/24/16.   Physical exam  Vitals:   05/22/16 2158 05/23/16 0637 05/23/16 1759 05/24/16 0523  BP: 135/72 119/72 125/85 129/77  Pulse: 93 83 (!) 101 77  Resp:  18 19 18   Temp:  98.5 F (36.9 C) 98.2 F (36.8 C) 98.1 F (36.7 C)  TempSrc:  Oral Oral   SpO2:      Weight:       General: alert, cooperative and no distress Lochia: appropriate Uterine Fundus: firm Incision: N/A DVT Evaluation: No evidence of DVT seen on physical exam. No cords or calf tenderness. No significant calf/ankle  edema. Labs: Lab Results  Component Value Date   WBC 12.6 (H) 05/21/2016   HGB 12.7 05/21/2016   HCT 37.6 05/21/2016   MCV 76.7 (L) 05/21/2016   PLT 210 05/21/2016   CMP Latest Ref Rng & Units 05/21/2016  Glucose 65 - 99 mg/dL 79  BUN 6 - 20 mg/dL -  Creatinine 0.44 - 1.00 mg/dL -  Sodium 135 - 145 mmol/L -  Potassium 3.5 - 5.1 mmol/L -  Chloride 101 - 111 mmol/L -  CO2 22 - 32 mmol/L -  Calcium 8.9 - 10.3 mg/dL -  Total Protein 6.5 - 8.1 g/dL -  Total Bilirubin 0.3 - 1.2 mg/dL -  Alkaline Phos 38 - 126 U/L -  AST 15 - 41 U/L -  ALT 14 - 54 U/L -    Discharge instruction: per After Visit Summary and "Baby and Me Booklet".  After visit meds:  Allergies  as of 05/24/2016   No Known Allergies     Medication List    STOP taking these medications   aspirin EC 81 MG tablet   glucose blood test strip Commonly known as:  ACCU-CHEK GUIDE   glyBURIDE 2.5 MG tablet Commonly known as:  DIABETA   labetalol 100 MG tablet Commonly known as:  NORMODYNE     TAKE these medications   ACCU-CHEK FASTCLIX LANCETS Misc Check blood glucose four times daily   amLODipine 10 MG tablet Commonly known as:  NORVASC Take 1 tablet (10 mg total) by mouth daily.   ibuprofen 600 MG tablet Commonly known as:  ADVIL,MOTRIN Take 1 tablet (600 mg total) by mouth every 6 (six) hours.   oxyCODONE-acetaminophen 5-325 MG tablet Commonly known as:  PERCOCET/ROXICET Take 1-2 tablets by mouth every 4 (four) hours as needed (pain scale > 7).   oxymetazoline 0.05 % nasal spray Commonly known as:  AFRIN Place 1 spray into both nostrils 2 (two) times daily as needed for congestion.   prenatal multivitamin Tabs tablet Take 1 tablet by mouth daily at 12 noon.   senna-docusate 8.6-50 MG tablet Commonly known as:  Senokot-S Take 2 tablets by mouth at bedtime.       Diet: low salt diet  Activity: Advance as tolerated. Pelvic rest for 6 weeks.   Outpatient follow up:4 weeks Follow up Appt:No  future appointments. Follow up Visit:No Follow-up on file.  Postpartum contraception: IUD Mirena  Newborn Data: Live born female  Birth Weight: 7 lb 2 oz (3232 g) APGAR: 7, 9  Baby Feeding: Bottle and Breast Disposition:home with mother   05/24/2016 Morene Crocker, CNM

## 2016-05-31 ENCOUNTER — Ambulatory Visit (HOSPITAL_COMMUNITY): Payer: Medicaid Other

## 2016-06-07 ENCOUNTER — Ambulatory Visit (HOSPITAL_COMMUNITY): Payer: Medicaid Other

## 2016-06-20 ENCOUNTER — Ambulatory Visit (INDEPENDENT_AMBULATORY_CARE_PROVIDER_SITE_OTHER): Payer: Medicaid Other | Admitting: Obstetrics and Gynecology

## 2016-06-20 ENCOUNTER — Encounter: Payer: Self-pay | Admitting: Obstetrics and Gynecology

## 2016-06-20 VITALS — BP 149/103 | HR 106 | Wt 254.9 lb

## 2016-06-20 DIAGNOSIS — O24415 Gestational diabetes mellitus in pregnancy, controlled by oral hypoglycemic drugs: Secondary | ICD-10-CM

## 2016-06-20 DIAGNOSIS — I10 Essential (primary) hypertension: Secondary | ICD-10-CM

## 2016-06-20 NOTE — Progress Notes (Signed)
Subjective:     Nichole Green is a 36 y.o. female who presents for a postpartum visit. She is 4 weeks postpartum following a spontaneous vaginal delivery. I have fully reviewed the prenatal and intrapartum course. The delivery was at 35 gestational weeks. Outcome: spontaneous vaginal delivery. Anesthesia: epidural. Postpartum course has been unremarkable. Baby's course has been unremarkable. Baby is feeding by bottle - Similac Advance. Bleeding no bleeding. Bowel function is normal. Bladder function is normal. Patient is not sexually active. Contraception method is none. Postpartum depression screening: negative.  The following portions of the patient's history were reviewed and updated as appropriate: allergies, current medications, past family history, past medical history, past social history and past surgical history.  Review of Systems Pertinent items are noted in HPI.   Objective:    There were no vitals taken for this visit.  General:  alert   Breasts:  not examined  Lungs: clear to auscultation bilaterally  Heart:  regular rate and rhythm  Abdomen: soft, non-tender; bowel sounds normal; no masses,  no organomegaly   Vulva:  not evaluated  Vagina: not evaluated  Cervix:  not examined  Corpus: not examined  Adnexa:  not evaluated  Rectal Exam: Not performed.        Assessment:     Nl postpartum exam.   CHTN   H/O GDM  Plan:    1. Contraception: Nexplanon, information provided. Pt to set up appt for insertion 2. Pt did not take BP meds today. Importance of taking reviewed with pt. Pt has appt with PCP next week to eval BP. Pt advised to keep appt. Continue with Norvasc until 3. Follow up in: 2 weeks for glucola d/t GDM and set up appt for Nexplanon insertion or as needed.

## 2016-07-04 ENCOUNTER — Other Ambulatory Visit: Payer: Medicaid Other

## 2016-07-12 ENCOUNTER — Other Ambulatory Visit: Payer: Medicaid Other

## 2016-07-12 DIAGNOSIS — O24419 Gestational diabetes mellitus in pregnancy, unspecified control: Secondary | ICD-10-CM

## 2016-07-13 LAB — GLUCOSE TOLERANCE, 2 HOURS W/ 1HR
GLUCOSE, FASTING: 96 mg/dL — AB (ref 65–91)
Glucose, 1 hour: 156 mg/dL (ref 65–179)
Glucose, 2 hour: 91 mg/dL (ref 65–152)

## 2016-07-16 ENCOUNTER — Other Ambulatory Visit: Payer: Self-pay | Admitting: Certified Nurse Midwife

## 2016-08-09 ENCOUNTER — Encounter: Payer: Self-pay | Admitting: *Deleted

## 2016-08-09 ENCOUNTER — Encounter: Payer: Self-pay | Admitting: Obstetrics and Gynecology

## 2016-08-09 ENCOUNTER — Ambulatory Visit (INDEPENDENT_AMBULATORY_CARE_PROVIDER_SITE_OTHER): Payer: Medicaid Other | Admitting: Obstetrics and Gynecology

## 2016-08-09 VITALS — BP 150/108 | HR 116 | Wt 250.0 lb

## 2016-08-09 DIAGNOSIS — Z3202 Encounter for pregnancy test, result negative: Secondary | ICD-10-CM | POA: Diagnosis not present

## 2016-08-09 DIAGNOSIS — Z3049 Encounter for surveillance of other contraceptives: Secondary | ICD-10-CM

## 2016-08-09 DIAGNOSIS — Z30017 Encounter for initial prescription of implantable subdermal contraceptive: Secondary | ICD-10-CM

## 2016-08-09 LAB — POCT URINE PREGNANCY: Preg Test, Ur: NEGATIVE

## 2016-08-09 MED ORDER — ETONOGESTREL 68 MG ~~LOC~~ IMPL
68.0000 mg | DRUG_IMPLANT | Freq: Once | SUBCUTANEOUS | Status: AC
  Administered 2016-08-09: 68 mg via SUBCUTANEOUS

## 2016-08-09 NOTE — Progress Notes (Signed)
Patient presents for Nexplanon Insertion.  UPT is NEGATIVE. She had unprotected sex 3 days ago, however period started 08/08/16.  BP is elevated, she took meds. At 8:00 am today. Different ones from what is in her medication list. She is having nausea and dizziness.

## 2016-08-09 NOTE — Progress Notes (Signed)
Patient given informed consent, signed copy in the chart, time out was performed. Pregnancy test was negative. Appropriate time out taken.  Patient's left arm was prepped and draped in the usual sterile fashion.. The ruler used to measure and mark insertion area.  Patient was prepped with alcohol swab and then injected with 1.5 cc of 1 % lidocaine with epinephrine.  Patient was prepped with betadine, Nexplanon removed form packaging.  Device confirmed in needle, then inserted full length of needle and withdrawn per handbook instructions.  Patient insertion site covered with a band-aid and pressure dressing.   Minimal blood loss.  Patient tolerated the procedure well.   Patient to follow up with PCP for HTN and abnormal post partum glucola screen. Patient due for pap smear in fall 2018

## 2016-08-10 MED ORDER — ETONOGESTREL 68 MG ~~LOC~~ IMPL: 68.0000 mg | DRUG_IMPLANT | Freq: Once | SUBCUTANEOUS | Status: DC

## 2016-08-10 NOTE — Addendum Note (Signed)
Addended by: Tamela Oddi on: 08/10/2016 09:53 AM   Modules accepted: Orders

## 2017-03-26 HISTORY — PX: OTHER SURGICAL HISTORY: SHX169

## 2017-05-23 ENCOUNTER — Other Ambulatory Visit: Payer: Self-pay | Admitting: Internal Medicine

## 2017-05-23 DIAGNOSIS — N644 Mastodynia: Secondary | ICD-10-CM

## 2017-06-04 ENCOUNTER — Other Ambulatory Visit: Payer: Self-pay

## 2017-07-29 ENCOUNTER — Other Ambulatory Visit: Payer: Self-pay | Admitting: Internal Medicine

## 2017-07-29 ENCOUNTER — Ambulatory Visit
Admission: RE | Admit: 2017-07-29 | Discharge: 2017-07-29 | Disposition: A | Discharge status: Home / Self Care | Payer: Self-pay | Source: Ambulatory Visit | Attending: Internal Medicine | Admitting: Internal Medicine

## 2017-07-29 DIAGNOSIS — M79671 Pain in right foot: Secondary | ICD-10-CM

## 2017-07-29 DIAGNOSIS — M79672 Pain in left foot: Principal | ICD-10-CM

## 2017-07-29 IMAGING — CR DG OS CALCIS 2+V*R*
2 series · 2 of 2 positions shown · non-contrast
Comparison: None in PACs

CLINICAL DATA: Bilateral hips heel pain for the past 3 weeks. No
known injury.

EXAM:
LEFT OS CALCIS - 2+ VIEW; RIGHT OS CALCIS - 2+ VIEW

[x calcaneus lat right]
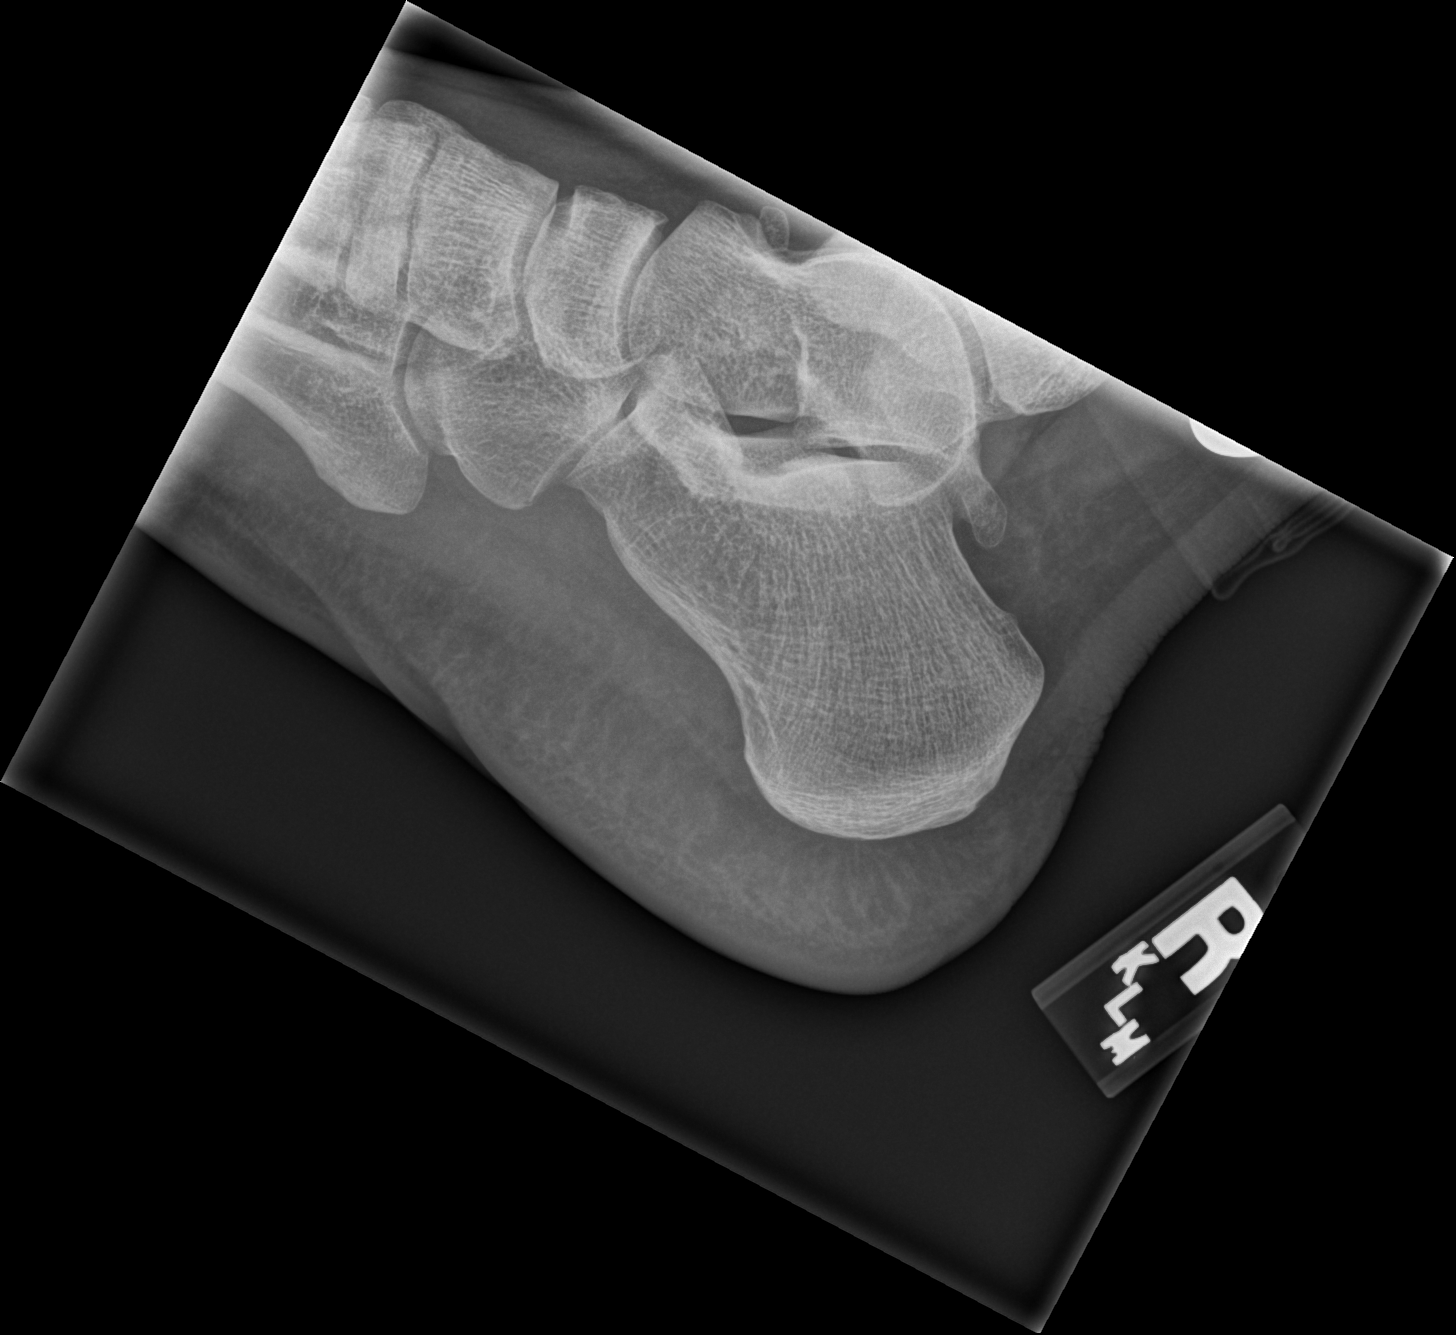

[x calcaneus axial right]
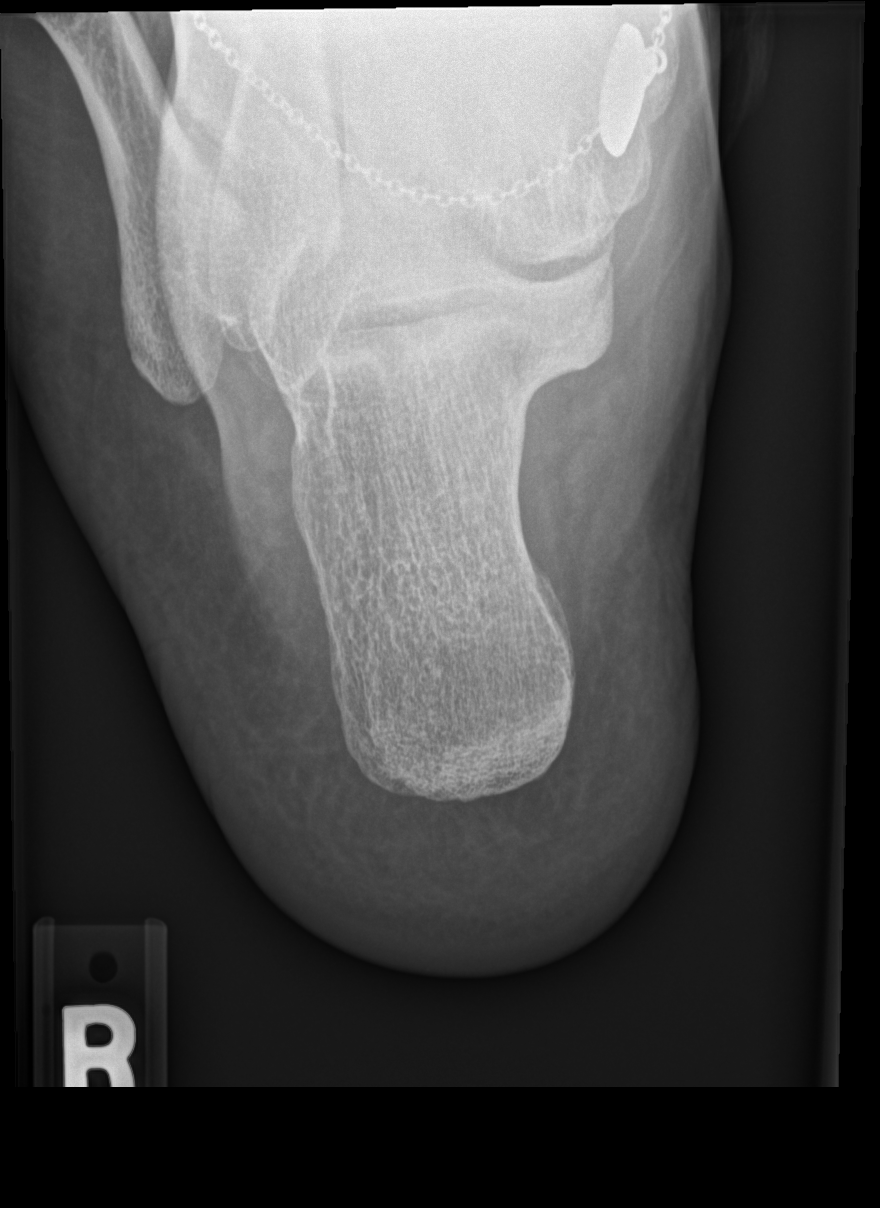

[2 of 2 positions shown; findings below may reference images not displayed]

FINDINGS: Right heel: The calcaneus is adequately mineralized. No spurs are
present. There is no acute or healing fracture. The plantar fat pad
over the hindfoot appears normal.

Left heel: The calcaneus is adequately mineralized. There is no
acute or healing fracture. There is a moderate-sized plantar
calcaneal spur. The soft tissues of the heel pad are unremarkable.
IMPRESSION: Small plantar spur arising from the calcaneus on the left. No
right-sided spurring. No acute bony abnormality.

## 2017-07-29 IMAGING — CR DG OS CALCIS 2+V*L*
2 series · 2 of 2 positions shown · non-contrast
Comparison: None in PACs

CLINICAL DATA: Bilateral hips heel pain for the past 3 weeks. No
known injury.

EXAM:
LEFT OS CALCIS - 2+ VIEW; RIGHT OS CALCIS - 2+ VIEW

[x calcaneus lat left]
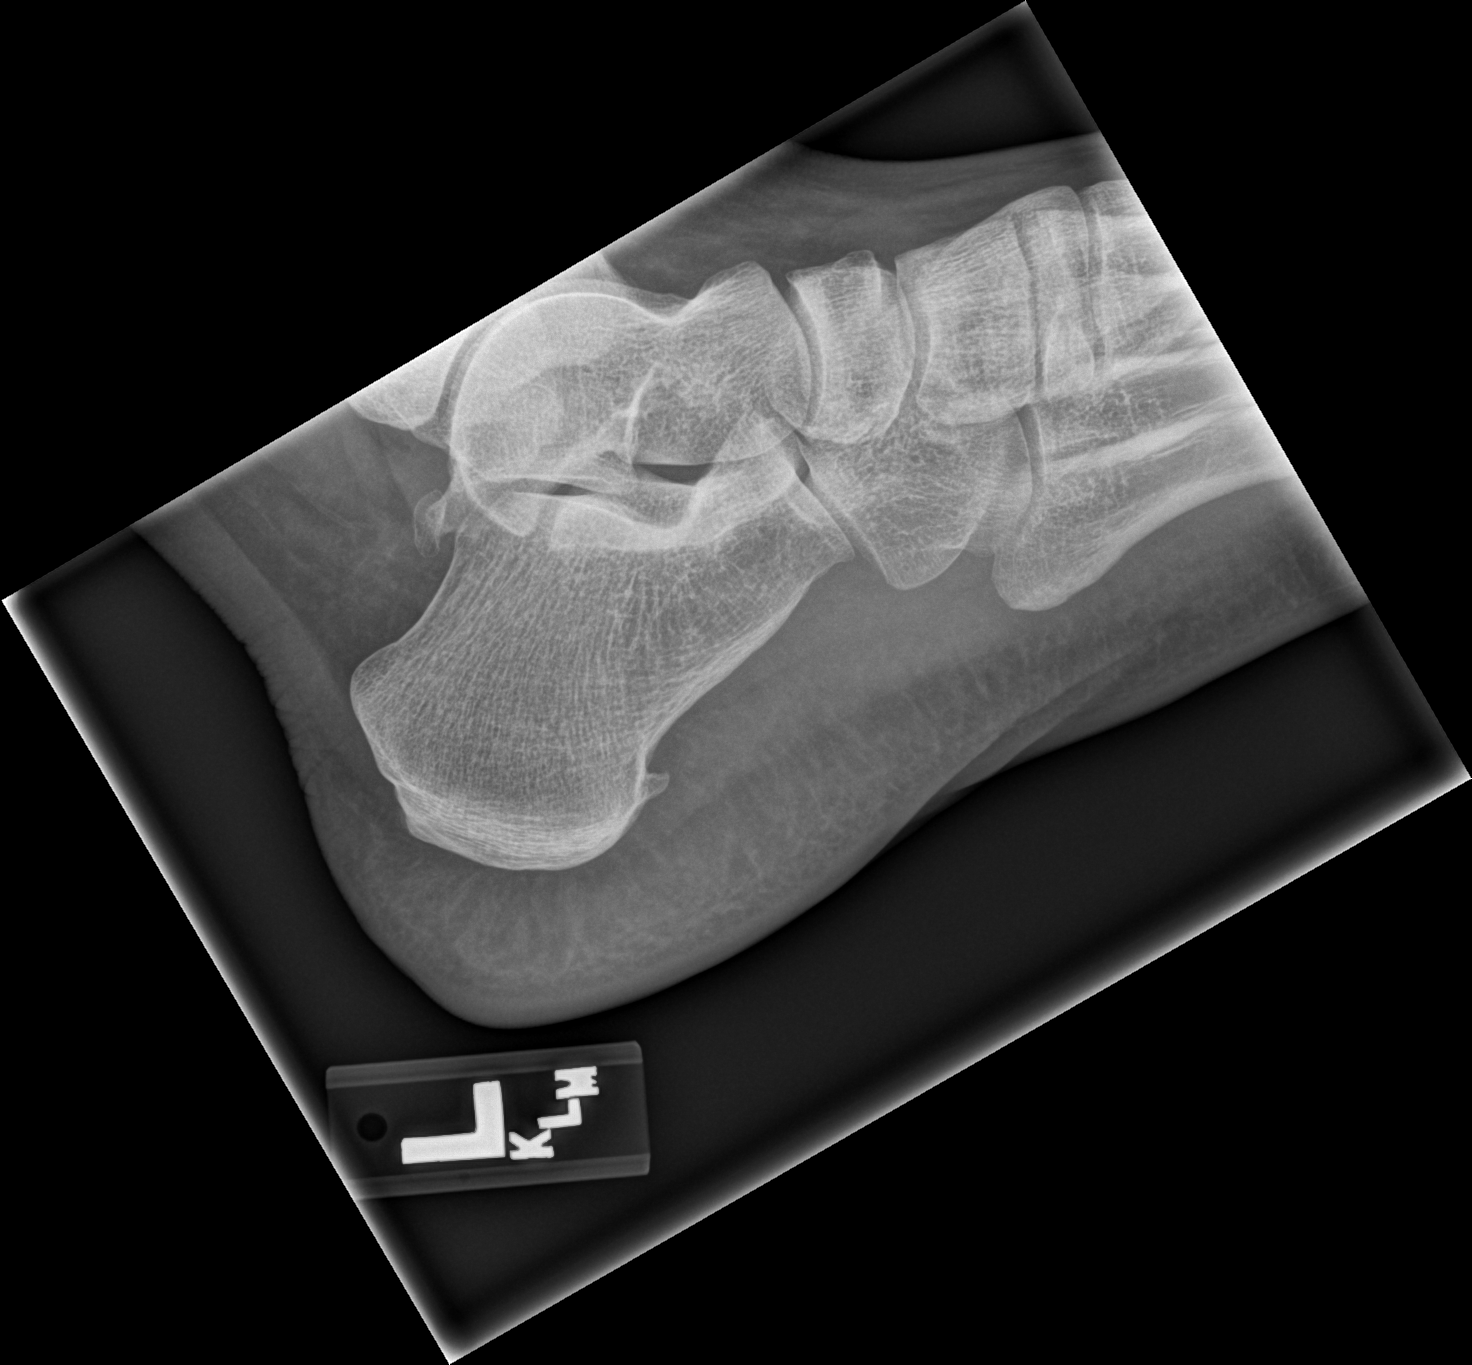

[x calcaneus axial left]
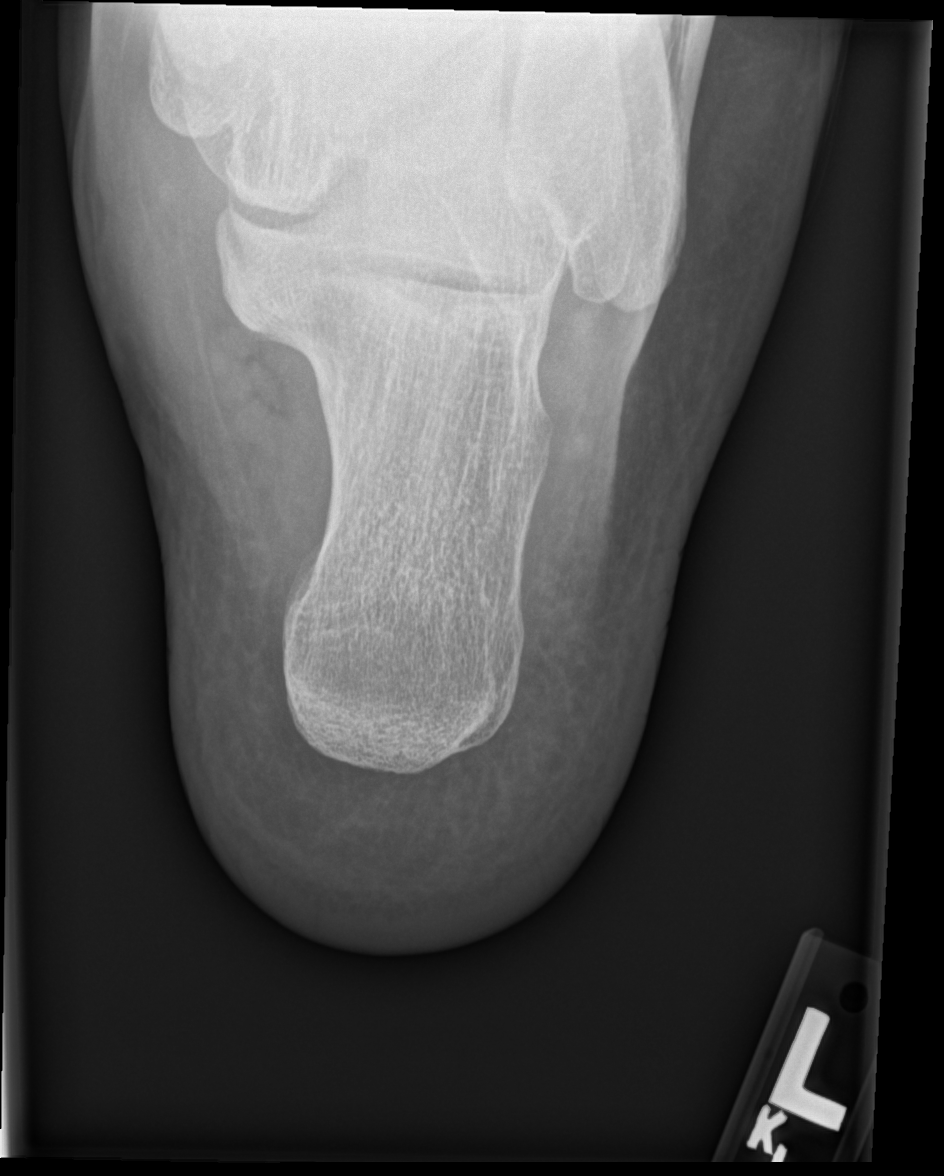

[2 of 2 positions shown; findings below may reference images not displayed]

FINDINGS: Right heel: The calcaneus is adequately mineralized. No spurs are
present. There is no acute or healing fracture. The plantar fat pad
over the hindfoot appears normal.

Left heel: The calcaneus is adequately mineralized. There is no
acute or healing fracture. There is a moderate-sized plantar
calcaneal spur. The soft tissues of the heel pad are unremarkable.
IMPRESSION: Small plantar spur arising from the calcaneus on the left. No
right-sided spurring. No acute bony abnormality.

## 2017-11-07 ENCOUNTER — Ambulatory Visit: Payer: Medicaid Other | Attending: Internal Medicine | Admitting: Physical Therapy

## 2017-11-07 ENCOUNTER — Encounter: Payer: Self-pay | Admitting: Physical Therapy

## 2017-11-07 ENCOUNTER — Other Ambulatory Visit: Payer: Self-pay

## 2017-11-07 DIAGNOSIS — R262 Difficulty in walking, not elsewhere classified: Secondary | ICD-10-CM | POA: Insufficient documentation

## 2017-11-07 DIAGNOSIS — M79671 Pain in right foot: Secondary | ICD-10-CM | POA: Diagnosis not present

## 2017-11-07 DIAGNOSIS — M6281 Muscle weakness (generalized): Secondary | ICD-10-CM | POA: Insufficient documentation

## 2017-11-07 DIAGNOSIS — M79672 Pain in left foot: Secondary | ICD-10-CM | POA: Insufficient documentation

## 2017-11-07 NOTE — Therapy (Addendum)
[facility] [address] [place], [place], [address] Phone: [phone]   Fax:  [phone]  Physical Therapy Evaluation/Discharge  Patient Details  Name: Nichole Green MRN: [id number] Date of Birth: Feb 19, 1981 Referring Provider: Latanya Presser, MD   Encounter Date: 11/07/2017  PT End of Session - 11/07/17 1209    Visit Number  1    Date for PT Re-Evaluation  01/07/18    Authorization Time Period  11/07/17 to 01/07/18    Authorization - Visit Number  1    Authorization - Number of Visits  4    PT Start Time  1146    PT Stop Time  1210    PT Time Calculation (min)  24 min    Activity Tolerance  No increased pain    Behavior During Therapy  [facility] for tasks assessed/performed       Past Medical History:  Diagnosis Date  . Headache    only when B/P elevated   . Hypertension   . Infection    UTI  . Pregnancy induced hypertension     Past Surgical History:  Procedure Laterality Date  . BREAST REDUCTION SURGERY    . ROBOTIC ASSISTED LAPAROSCOPIC OVARIAN CYSTECTOMY N/A 01/17/2016   Procedure: XI ROBOTIC ASSISTED LAPAROSCOPIC OVARIAN CYSTECTOMY;  Surgeon: Everitt Amber, MD;  Location: [facility];  Service: Gynecology;  Laterality: N/A;    There were no vitals filed for this visit.   Subjective Assessment - 11/07/17 1150    Subjective  Pt reports that her Lt food started bothering her 4 months ago. She went the doctor a few weeks after and was referred to PT. She tried changing her shoes and using ice/massage but this only helped a little. When she wears a wedge or small heel she thinks this actually feels better. She notes that her Rt foot also started bothering her about 1-2 months ago. This is more on the outside of her foot.     Limitations  Walking;Standing    How long can you walk comfortably?  2 hours     Diagnostic tests  xray: unsure what they found    Patient Stated Goals  decrease pain     Currently in Pain?   No/denies    Aggravating Factors   pain at night while in bed, walking, standing for too long     Pain Relieving Factors  ice/massage help some; off loading her feet    Effect of Pain on Daily Activities  difficulty walking middle of the night to make a bottle          [facility] PT Assessment - 11/07/17 0001      Assessment   Medical Diagnosis  Calcaneal spur Lt foot     Referring Provider  Latanya Presser, MD    Next MD Visit  none for now       Precautions   Precautions  None      Restrictions   Weight Bearing Restrictions  No      Balance Screen   Has the patient fallen in the past 6 months  No    Has the patient had a decrease in activity level because of a fear of falling?   No    Is the patient reluctant to leave their home because of a fear of falling?   No      Prior Function   Level of Independence  Independent    Leisure  has a 1.37 yr old  that she takes care of, also has 2 other children (9 and 10 yr)       Cognition   Overall Cognitive Status  Within Functional Limits for tasks assessed      Observation/Other Assessments   Observations  pt wearing wedges      Sensation   Additional Comments  denies numbness and tingling       Posture/Postural Control   Posture Comments  bilateral foot pronation Lt worse than Rt       ROM / Strength   AROM / PROM / Strength  AROM;Strength      AROM   AROM Assessment Site  Ankle    Right/Left Ankle  Right;Left    Right Ankle Dorsiflexion  5    Right Ankle Plantar Flexion  45    Right Ankle Inversion  40    Left Ankle Dorsiflexion  10    Left Ankle Plantar Flexion  45    Left Ankle Inversion  30      Strength   Strength Assessment Site  Ankle    Right/Left Ankle  Right;Left    Right Ankle Dorsiflexion  4/5    Right Ankle Plantar Flexion  --   Rt heel raise x10 reps    Right Ankle Inversion  5/5    Right Ankle Eversion  5/5    Left Ankle Dorsiflexion  4/5    Left Ankle Plantar Flexion  --   Lt heel raise x10 reps     Left Ankle Inversion  5/5    Left Ankle Eversion  5/5      Palpation   Palpation comment  tenderness along Lt calcaneal tuberosity and Rt peroneal attachment at 5th metatarsal       Ambulation/Gait   Gait Comments  increase B tibial inversion, foot pronation      High Level Balance   High Level Balance Comments  SLS on firm surface 5-6 sec each LE with eyes open                 Objective measurements completed on examination: See above findings.              PT Education - 11/07/17 1215    Education Details  eval findings/POC; ice massage at home     Person(s) Educated  Patient    Methods  Explanation    Comprehension  Verbalized understanding       PT Short Term Goals - 11/07/17 1220      PT SHORT TERM GOAL #1   Title  Pt will demo consistency and independence with her initial HEP to improve ankle strength and ROM.     Time  4    Period  Weeks    Status  New    Target Date  12/08/17        PT Long Term Goals - 11/07/17 1221      PT LONG TERM GOAL #1   Title  Pt will report atleast 60% reduction in her pain from the start of PT which will allow her to get up during the night to care for her son.     Time  8    Period  Weeks    Status  New    Target Date  01/07/18      PT LONG TERM GOAL #2   Title  Pt will demo active bilateral ankle dorsiflexion to atleast 10 deg to improve foot clearance during ambulation.    Time  8    Status  New      PT LONG TERM GOAL #3   Title  Pt will be able to complete atleast 25 single leg heel raises on each LE, to reflect improvements in gastroc strength.     Time  8    Period  Weeks    Status  New      PT LONG TERM GOAL #4   Title  Pt will be able to maintain single leg stance on firm surface, with eyes open, for atleast 20 sec without LOB 2/3 trials, to reflect improvements in ankle proprioception and intrinsic foot muscle strength.     Time  8    Period  Weeks    Status  New             Plan -  11/07/17 1212    Clinical Impression Statement  Pt is a 37 yo F referred to OPPT with complaints of Lt heel pain onset several months ago. She also has recent onset of Rt lateral foot pain. Pt is tender along the proximal plantar fascia attachment on the Lt and along the distal peroneal attachment on the Rt. In addition, she demonstrates poor gastroc strength, poor ankle proprioception and a flexible flat foot during standing assessment. She currently cares for her child throughout the day, and her pain often limits her ability to efficiently preparing bottles during the night. She would benefit from skilled PT to provide education regarding pain relief techniques, and therex and manual treatment to improve ankle ROM, ankle strength and endurance to facilitate her ability to complete daily activity without pain or limitation.     Clinical Presentation  Evolving    Clinical Presentation due to:  Rt foot now hurts     Clinical Decision Making  Low    Rehab Potential  Good    PT Frequency  2x / week    PT Duration  8 weeks    PT Treatment/Interventions  ADLs/Self Care Home Management;Cryotherapy;Gait training;Functional mobility training;Therapeutic activities;Therapeutic exercise;Patient/family education;Manual techniques;Passive range of motion;Neuromuscular re-education;Balance training;Dry needling;Taping    PT Next Visit Plan  taping arch for additional support; provide HEP for gastroc strength, intrinsic foot strength    PT Home Exercise Plan  next session    Consulted and Agree with Plan of Care  Patient       Patient will benefit from skilled therapeutic intervention in order to improve the following deficits and impairments:  Decreased activity tolerance, Decreased strength, Impaired flexibility, Postural dysfunction, Pain, Improper body mechanics, Decreased range of motion, Decreased balance, Abnormal gait, Hypomobility, Increased muscle spasms  Visit Diagnosis: Pain in right foot  Pain in  left foot  Muscle weakness (generalized)  Difficulty in walking, not elsewhere classified     Problem List Patient Active Problem List   Diagnosis Date Noted  . Chronic hypertension 06/20/2016  . Postpartum care following vaginal delivery 06/20/2016  . GDM (gestational diabetes mellitus) 04/09/2016  . Uterine fibroid 02/09/2016  . Chronic hypertension during pregnancy, antepartum 10/31/2015      12:28 PM,11/07/17 Sherol Dade PT, DPT [place] [facility] [address] [place], [place], [address] Phone: [phone]   Fax:  [phone]  Name: Nichole Green MRN: [id number] Date of Birth: 1980/11/22   *addendum to resolve episode of care and dc pt from PT.  [facility]  Visits from Start of Care: 1  Current  functional level related to goals / functional outcomes: See above for more details    Remaining deficits: See above for more details    Education / Equipment: See above for more details   Plan: Patient agrees to discharge.  Patient goals were not met. Patient is being discharged due to the patient's request.  ?????    Pt has no showed to her past 2 appointments. Spoke with pt who is requesting d/c due to no childcare.  4:11 PM,11/20/17 [place], [place] at [place]

## 2017-11-14 ENCOUNTER — Ambulatory Visit: Payer: Medicaid Other | Admitting: Physical Therapy

## 2017-11-15 ENCOUNTER — Telehealth: Payer: Self-pay | Admitting: Physical Therapy

## 2017-11-15 NOTE — Telephone Encounter (Signed)
No show 11/14/17, PT attempted to call pt and left voicemail for pt to return call at her earliest convenience.   9:02 AM,11/15/17 [place], [place] at [place]

## 2017-11-20 ENCOUNTER — Ambulatory Visit: Payer: Medicaid Other | Admitting: Physical Therapy

## 2017-11-20 ENCOUNTER — Telehealth: Payer: Self-pay | Admitting: Physical Therapy

## 2017-11-20 NOTE — Telephone Encounter (Signed)
PTA called pt for second no show. She cannot attend PT due to not having child care. Pt ok to discharge.   Myrene Galas, PTA @TODAY @ 4:10 PM

## 2017-11-26 ENCOUNTER — Telehealth: Payer: Self-pay

## 2017-11-26 NOTE — Telephone Encounter (Signed)
Returned call, pt wants earlier appt for vaginal irritation, advised pt, I would have scheduler call, if earlier appt is available.

## 2017-11-26 NOTE — Telephone Encounter (Signed)
Returned call and no answer, left vm.

## 2017-12-04 ENCOUNTER — Other Ambulatory Visit (HOSPITAL_COMMUNITY)
Admission: RE | Admit: 2017-12-04 | Discharge: 2017-12-04 | Disposition: A | Discharge status: Home / Self Care | Payer: Medicaid Other | Source: Ambulatory Visit | Attending: Obstetrics and Gynecology | Admitting: Obstetrics and Gynecology

## 2017-12-04 ENCOUNTER — Ambulatory Visit: Payer: Medicaid Other | Admitting: Obstetrics and Gynecology

## 2017-12-04 ENCOUNTER — Encounter: Payer: Self-pay | Admitting: Obstetrics and Gynecology

## 2017-12-04 VITALS — BP 111/73 | HR 71 | Wt 184.4 lb

## 2017-12-04 DIAGNOSIS — N760 Acute vaginitis: Secondary | ICD-10-CM | POA: Insufficient documentation

## 2017-12-04 DIAGNOSIS — T2101XA Burn of unspecified degree of chest wall, initial encounter: Secondary | ICD-10-CM

## 2017-12-04 NOTE — Progress Notes (Signed)
37 yo here for the evaluation of a 5-day history of vaginitis and right breast pain. Patient is using Nexplanon for contraception and reports irregular bleeding throughout the month. She reports intense vaginal pruritis which she treated with 1-day treatment Monistat with some results but pruritis remains present. Patient also reports noticing a burn on the inferior aspect of her right breast in February when she came out of the shower. Patient states she was not aware of the burn and denied feeling any pain. She was unable to follow up with her PCP and visited family [place]. Patient present for evaluation as she continues to have intermittent pain  Past Medical History:  Diagnosis Date  . Headache    only when B/P elevated   . Hypertension   . Infection    UTI  . Pregnancy induced hypertension    Past Surgical History:  Procedure Laterality Date  . BREAST REDUCTION SURGERY    . ROBOTIC ASSISTED LAPAROSCOPIC OVARIAN CYSTECTOMY N/A 01/17/2016   Procedure: XI ROBOTIC ASSISTED LAPAROSCOPIC OVARIAN CYSTECTOMY;  Surgeon: Everitt Amber, MD;  Location: [facility];  Service: Gynecology;  Laterality: N/A;   Family History  Problem Relation Age of Onset  . Hypertension Mother   . Diabetes Mother    Social History   Tobacco Use  . Smoking status: Former Smoker    Years: 2.00    Types: Cigarettes    Last attempt to quit: 10/25/2015    Years since quitting: 2.1  . Smokeless tobacco: Never Used  . Tobacco comment: hookah  Substance Use Topics  . Alcohol use: No  . Drug use: No   ROS See pertinent in HPI  Blood pressure 111/73, pulse 71, weight 184 lb 6.4 oz (83.6 kg), last menstrual period 11/27/2017, not currently breastfeeding. GENERAL: Well-developed, well-nourished female in no acute distress.  HEENT: Normocephalic, atraumatic. Sclerae anicteric.  NECK: Supple. Normal thyroid.  LUNGS: Clear to auscultation bilaterally.  HEART: Regular rate and rhythm. BREASTS: Symmetric in size. Linear  scar resembling a healing burn over the right breast. Palpable non mobile 1 cm mass underlying the scar. No palpable lymphadenopathy, skin changes, or nipple drainage. ABDOMEN: Soft, nontender, nondistended. No organomegaly. PELVIC: Normal external female genitalia. Vagina is pink and rugated.  Normal discharge.  EXTREMITIES: No cyanosis, clubbing, or edema, 2+ distal pulses.  A/P 37 yo here with vaginitis and right breast pain - Wet prep collected - Breast ultrasound ordered - Patient will be contacted with abnormal results - Patient needs to return for annual exam with pap smear

## 2017-12-04 NOTE — Progress Notes (Signed)
Pt c/o intermittent bilateral throbbing breast pain. She requests to address a burn on R breast she had last month. Pt also c/o vaginal burning and itching - had some relief with Monistat 1. She wants vaginal STD screening.

## 2017-12-06 LAB — CERVICOVAGINAL ANCILLARY ONLY
BACTERIAL VAGINITIS: POSITIVE — AB
CHLAMYDIA, DNA PROBE: NEGATIVE
Candida vaginitis: NEGATIVE
NEISSERIA GONORRHEA: NEGATIVE
TRICH (WINDOWPATH): NEGATIVE

## 2017-12-09 ENCOUNTER — Other Ambulatory Visit: Payer: Self-pay | Admitting: Obstetrics and Gynecology

## 2017-12-09 ENCOUNTER — Other Ambulatory Visit: Payer: Medicaid Other

## 2017-12-09 ENCOUNTER — Ambulatory Visit
Admission: RE | Admit: 2017-12-09 | Discharge: 2017-12-09 | Disposition: A | Discharge status: Home / Self Care | Payer: Medicaid Other | Source: Ambulatory Visit | Attending: Obstetrics and Gynecology | Admitting: Obstetrics and Gynecology

## 2017-12-09 DIAGNOSIS — N6313 Unspecified lump in the right breast, lower outer quadrant: Secondary | ICD-10-CM

## 2017-12-09 IMAGING — US ULTRASOUND RIGHT BREAST LIMITED
1 series · 4 of 4 positions shown · non-contrast
Comparison: Previous exam(s).

CLINICAL DATA: Area of palpable concern in the right breast lower
inner quadrant felt by the patient for approximately 7 months.
History of bilateral reduction mammoplasty.

EXAM:
DIGITAL DIAGNOSTIC BILATERAL MAMMOGRAM WITH CAD AND TOMO
ULTRASOUND RIGHT BREAST

[Series 1: ultrasound right breast limited · 0.07mm/px · 4 of 4 slices shown]
[im 1/4]
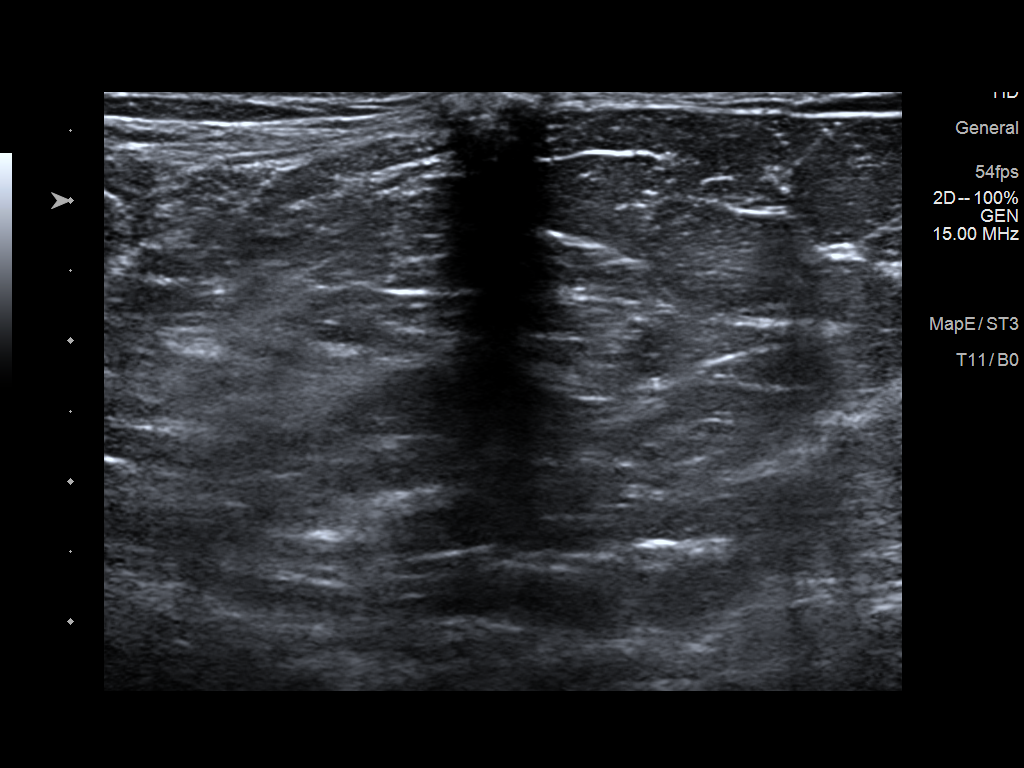
[im 2/4]
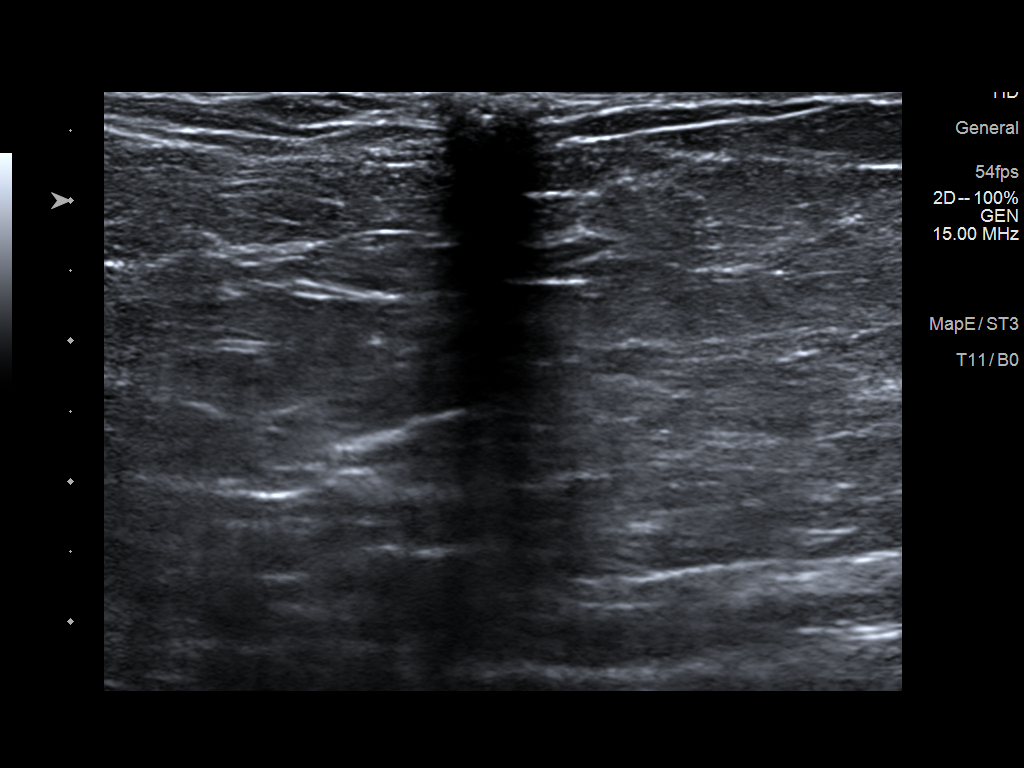
[im 3/4]
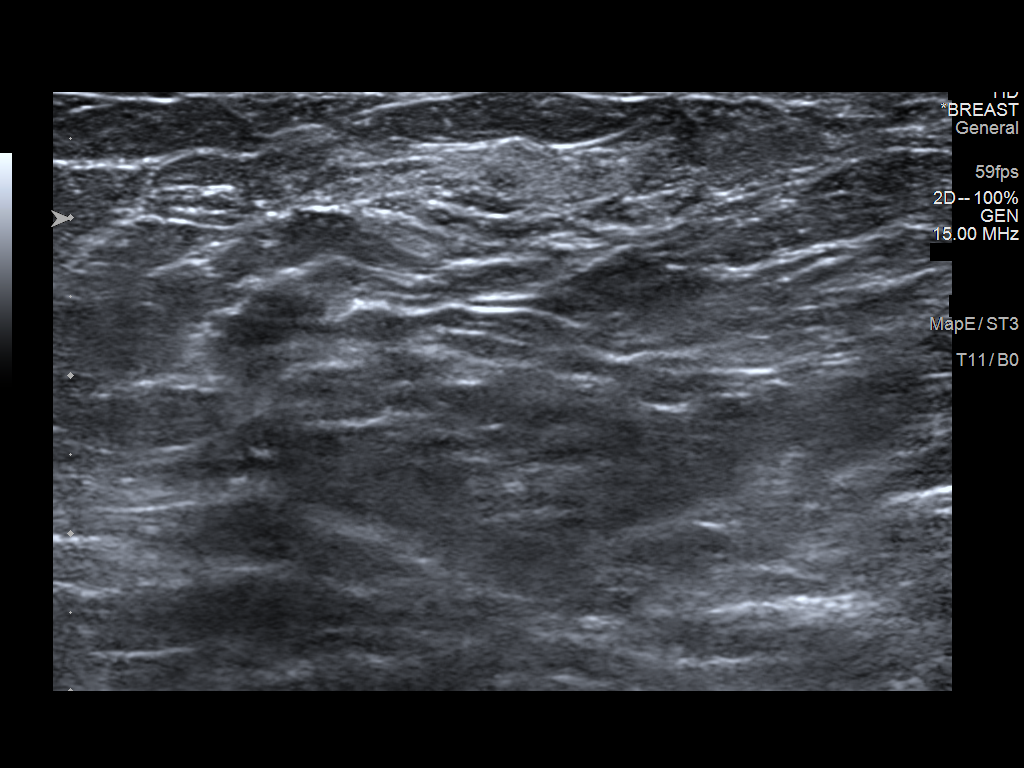
[im 4/4]
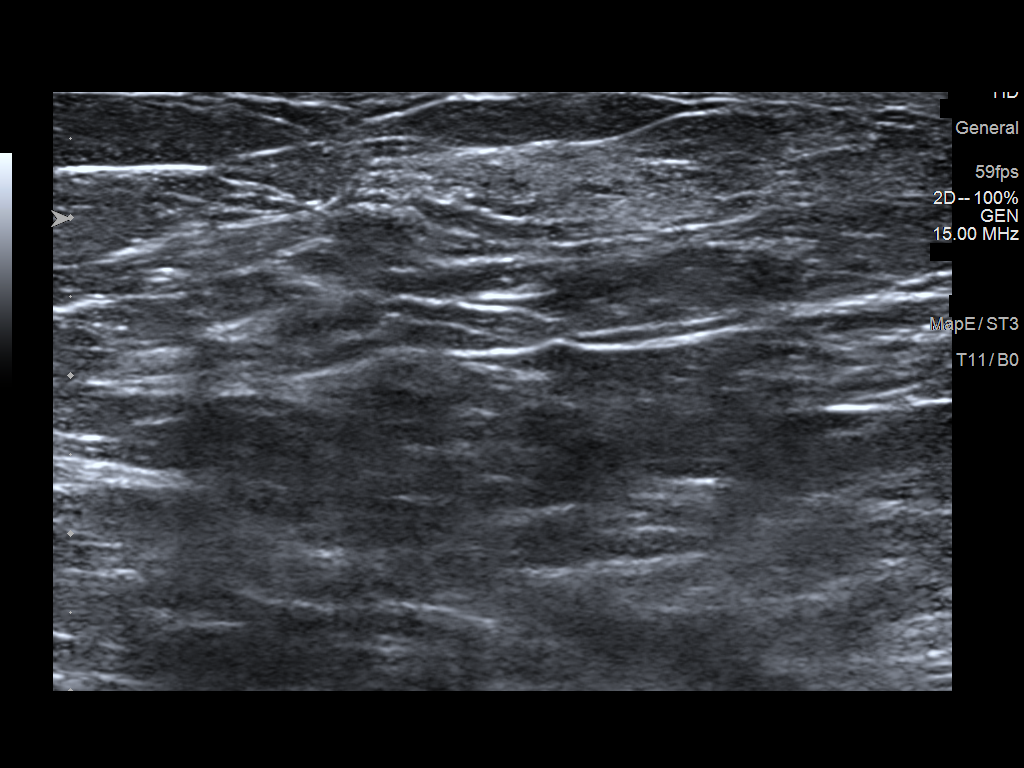

[4 of 4 positions shown; findings below may reference images not displayed]

ACR Breast Density Category b: There are scattered areas of
fibroglandular density.
FINDINGS: Mammographically, there are no suspicious masses, areas of
nonsurgical architectural distortion or microcalcifications in
either breast. Stable postsurgical distortion from reduction
mammoplasty. The palpable BB marker corresponds to what appears to
be an area of calcified fat necrosis in the right lower central
breast.

Mammographic images were processed with CAD.

On physical exam, no suspicious masses are palpated. There is a
linear area of darker skin discoloration in the right 9 o'clock
breast, middle depth, which per patient's report corresponded to
burn likely lesion which she noted first in [REDACTED].

Targeted ultrasound is performed, showing no suspicious masses or
shadowing lesions. The area of palpable concern in the right 7
o'clock breast corresponds to a coarse calcified area of fat
necrosis.
IMPRESSION: No mammographic or sonographic evidence of malignancy in either
breast.

The palpable abnormality in patient's right lower central breast
corresponds to an area of calcified fat necrosis.

Linear darker discoloration of the skin with the appearance of
scarring is seen in the right 9 o'clock breast, middle depth.
Dermatology consultation may be considered if this finding is
clinically concerning.

RECOMMENDATION:
Screening mammogram in one year.(Code:[DA])

I have discussed the findings and recommendations with the patient.
Results were also provided in writing at the conclusion of the
visit. If applicable, a reminder letter will be sent to the patient
regarding the next appointment.

BI-RADS CATEGORY  2: Benign.

## 2017-12-09 IMAGING — MG DIGITAL DIAGNOSTIC BILATERAL MAMMOGRAM WITH TOMO AND CAD
6 of 10 series · 6 of 30 positions shown · non-contrast
Comparison: Previous exam(s).

CLINICAL DATA: Area of palpable concern in the right breast lower
inner quadrant felt by the patient for approximately 7 months.
History of bilateral reduction mammoplasty.

EXAM:
DIGITAL DIAGNOSTIC BILATERAL MAMMOGRAM WITH CAD AND TOMO
ULTRASOUND RIGHT BREAST

[R CC synth-2D]
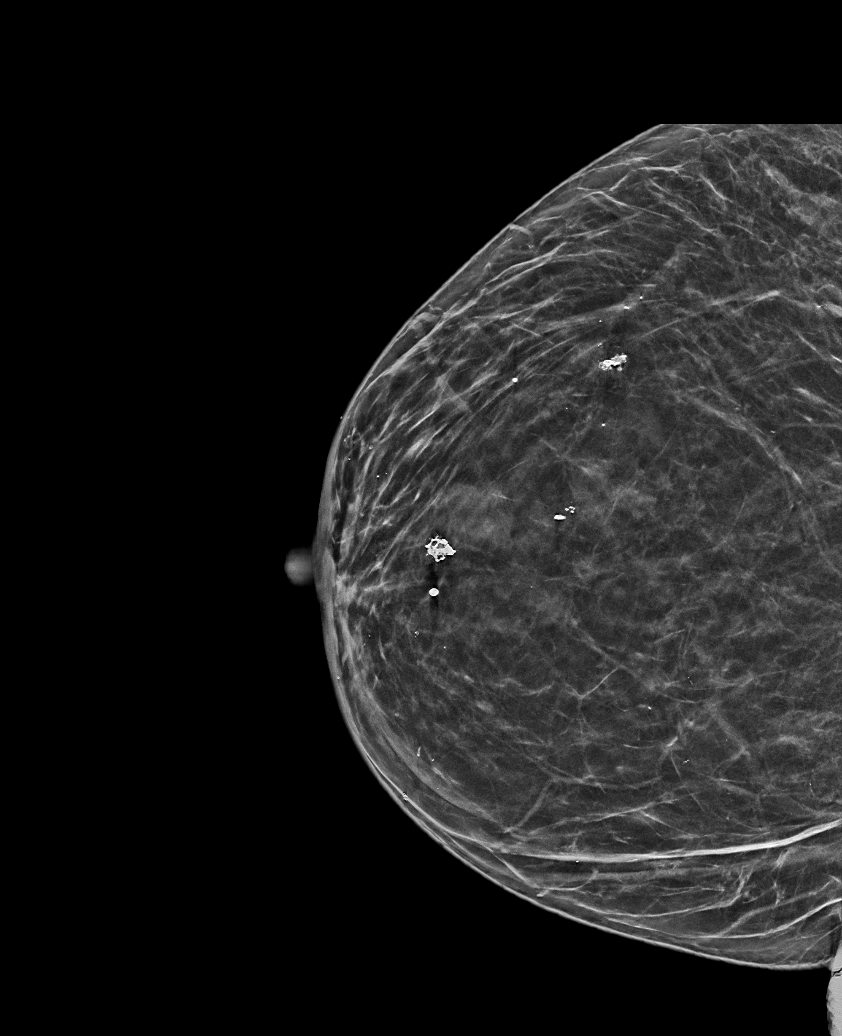

[R TAN synth-2D]
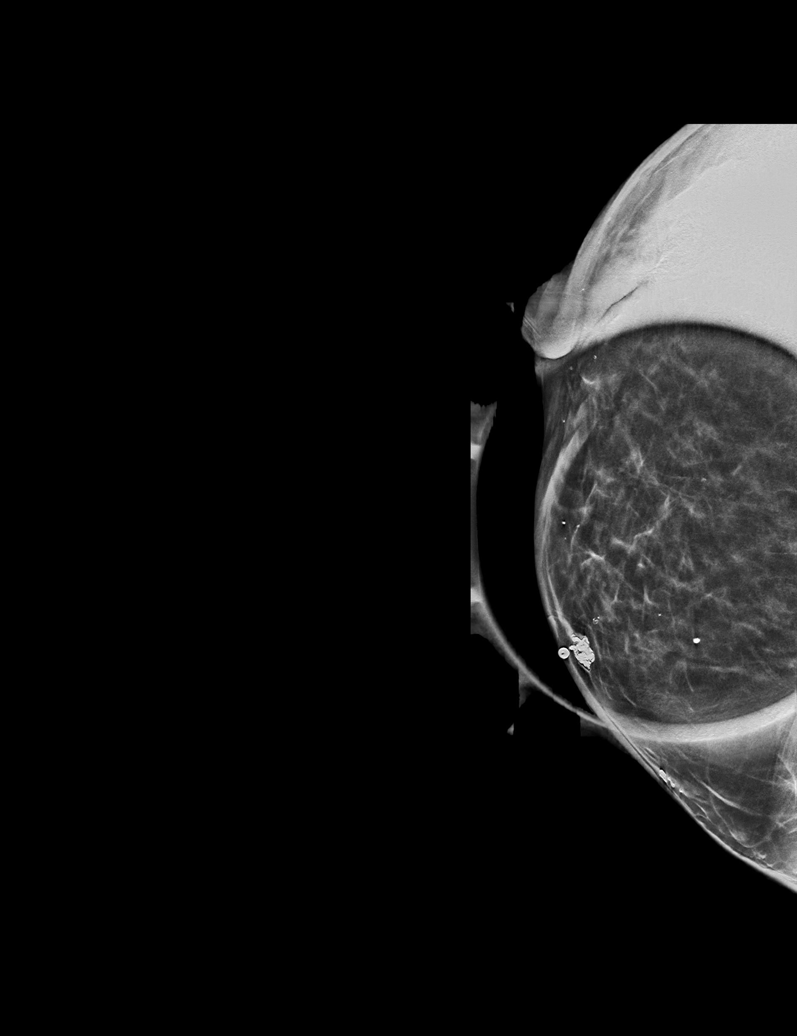

[R MLO synth-2D]
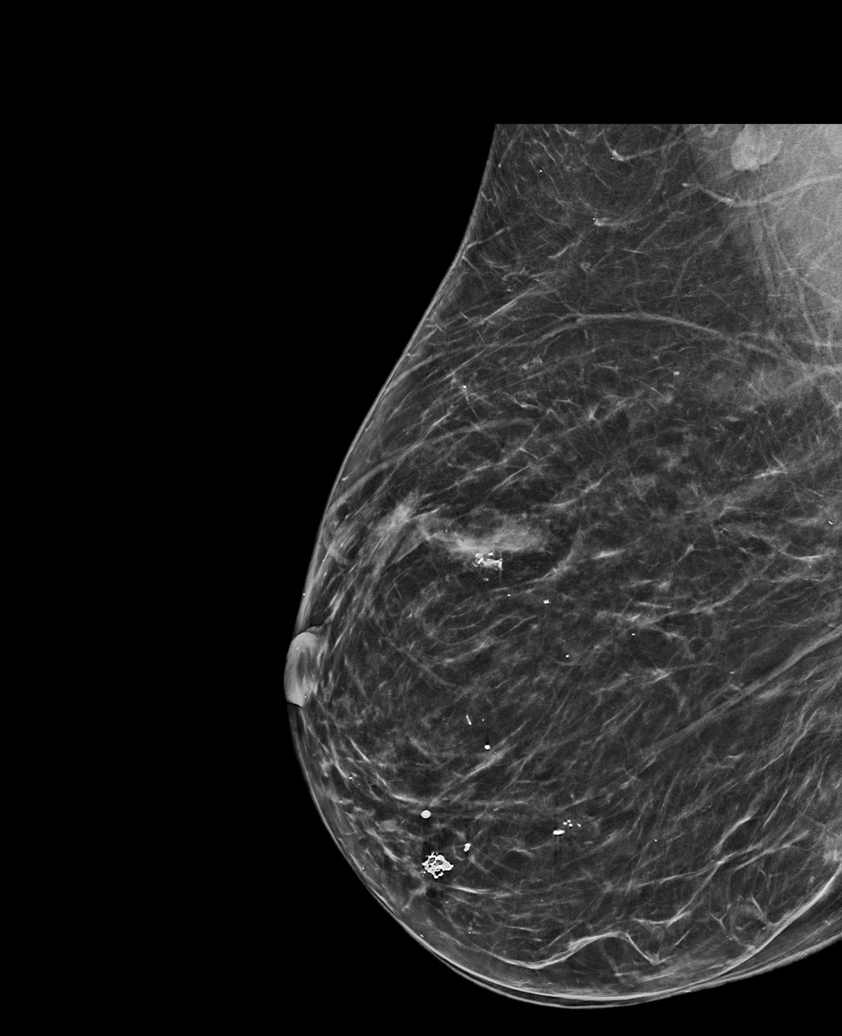

[L CC synth-2D]
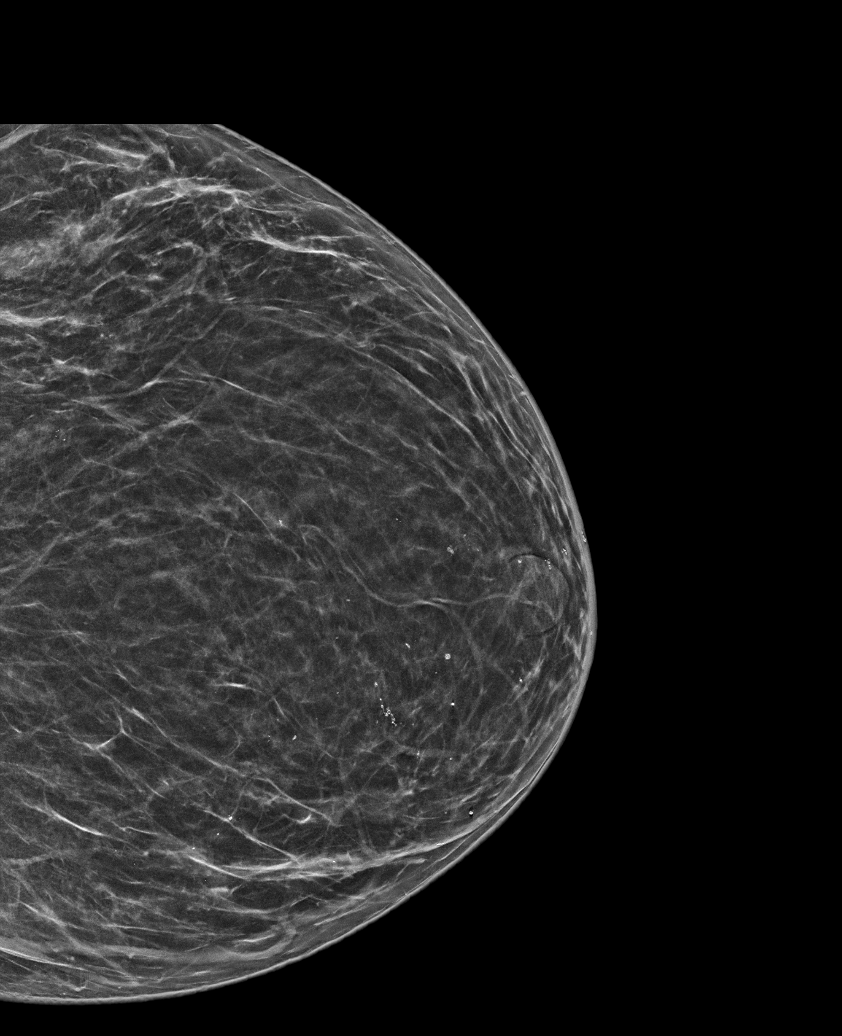

[L MLO synth-2D]
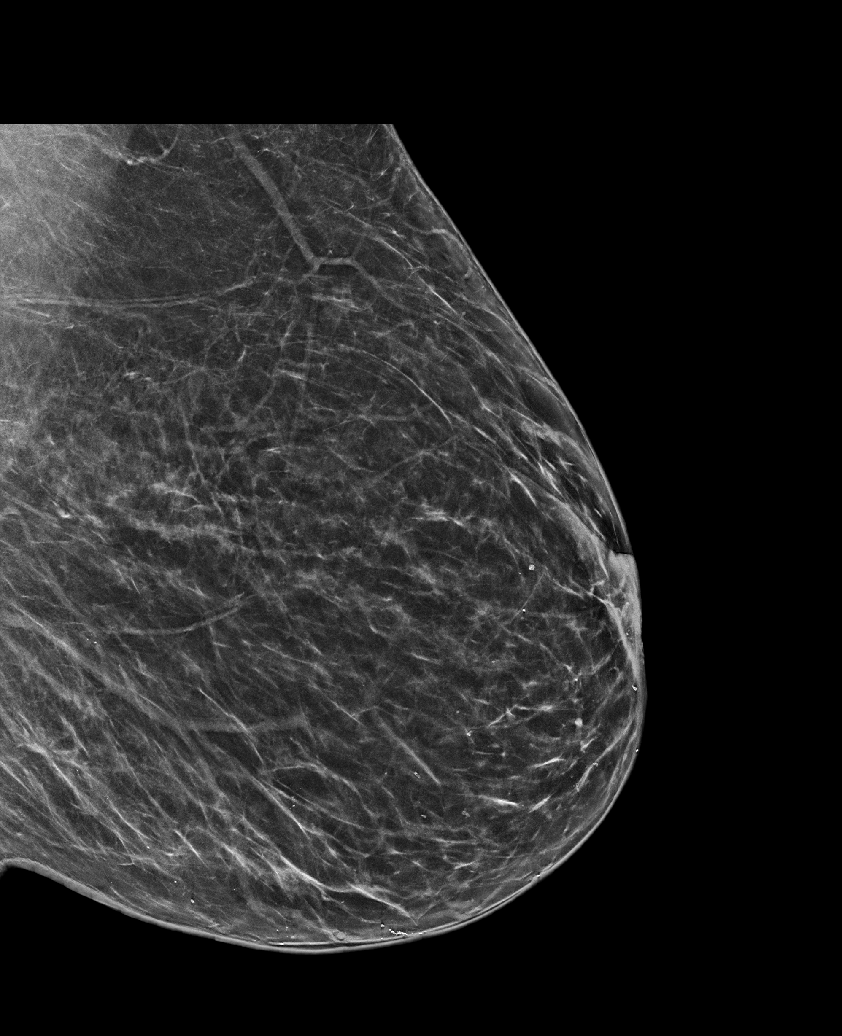

[R MLO tomo · tomo slice 35/70.0]
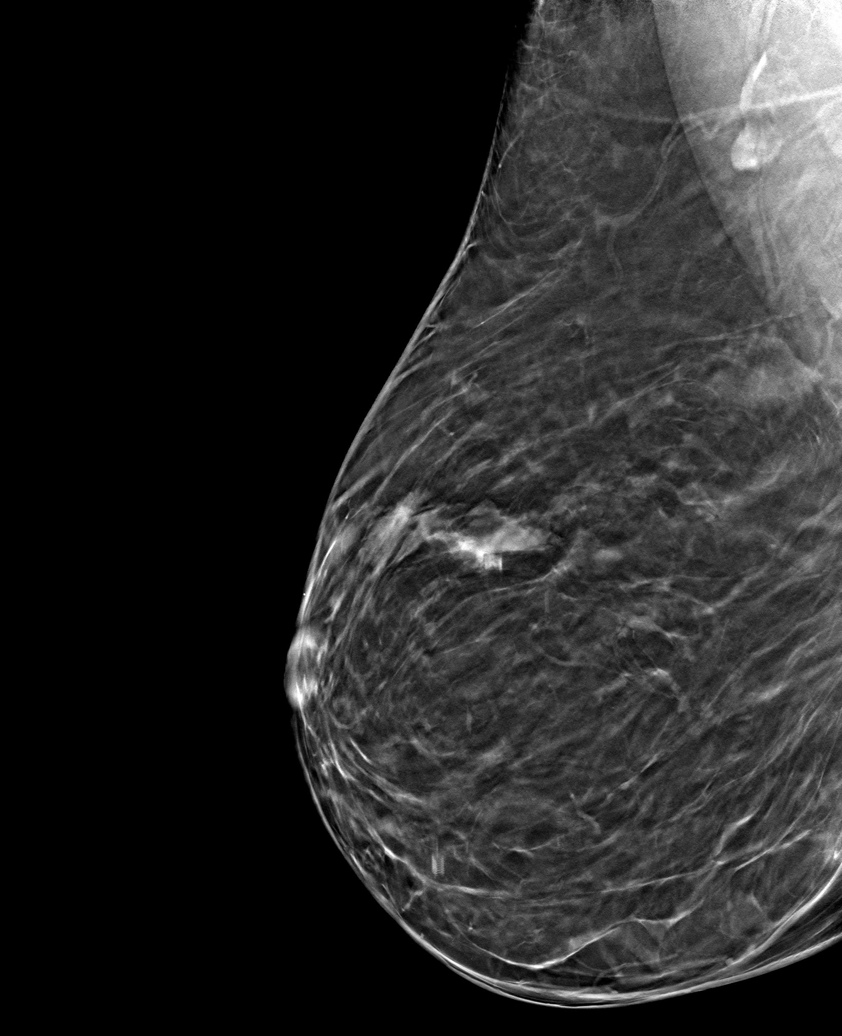

[6 of 30 positions shown; findings below may reference images not displayed]

ACR Breast Density Category b: There are scattered areas of
fibroglandular density.
FINDINGS: Mammographically, there are no suspicious masses, areas of
nonsurgical architectural distortion or microcalcifications in
either breast. Stable postsurgical distortion from reduction
mammoplasty. The palpable BB marker corresponds to what appears to
be an area of calcified fat necrosis in the right lower central
breast.

Mammographic images were processed with CAD.

On physical exam, no suspicious masses are palpated. There is a
linear area of darker skin discoloration in the right 9 o'clock
breast, middle depth, which per patient's report corresponded to
burn likely lesion which she noted first in [REDACTED].

Targeted ultrasound is performed, showing no suspicious masses or
shadowing lesions. The area of palpable concern in the right 7
o'clock breast corresponds to a coarse calcified area of fat
necrosis.
IMPRESSION: No mammographic or sonographic evidence of malignancy in either
breast.

The palpable abnormality in patient's right lower central breast
corresponds to an area of calcified fat necrosis.

Linear darker discoloration of the skin with the appearance of
scarring is seen in the right 9 o'clock breast, middle depth.
Dermatology consultation may be considered if this finding is
clinically concerning.

RECOMMENDATION:
Screening mammogram in one year.(Code:[DA])

I have discussed the findings and recommendations with the patient.
Results were also provided in writing at the conclusion of the
visit. If applicable, a reminder letter will be sent to the patient
regarding the next appointment.

BI-RADS CATEGORY  2: Benign.

## 2017-12-09 MED ORDER — METRONIDAZOLE 500 MG PO TABS: 500.0000 mg | ORAL_TABLET | Freq: Two times a day (BID) | ORAL | 0 refills | Status: DC

## 2018-03-26 HISTORY — PX: ABDOMINOPLASTY: SUR9

## 2018-04-03 ENCOUNTER — Telehealth: Payer: Self-pay | Admitting: *Deleted

## 2018-04-03 NOTE — Telephone Encounter (Signed)
Pt called to office stating she was having vaginal itching/redness/burning. States feels the same as when she was at last visit. Would like treatment.  Attempt to return call, no answer LM on VM.

## 2018-04-08 ENCOUNTER — Telehealth: Payer: Self-pay

## 2018-04-08 DIAGNOSIS — N898 Other specified noninflammatory disorders of vagina: Secondary | ICD-10-CM

## 2018-04-08 MED ORDER — METRONIDAZOLE 0.75 % VA GEL: 1.0000 | Freq: Every day | VAGINAL | 1 refills | Status: DC

## 2018-04-08 NOTE — Telephone Encounter (Signed)
Return call to patient regarding message Complaining of possible BV  Pt not ava  Pt returned call while I was noting in her chart Will send Rx per protocol Pt made aware confirmed pharmacy.

## 2018-04-12 ENCOUNTER — Emergency Department (HOSPITAL_COMMUNITY)
Admission: EM | Admit: 2018-04-12 | Discharge: 2018-04-12 | Disposition: A | Discharge status: Home / Self Care | Payer: Medicaid Other | Attending: Emergency Medicine | Admitting: Emergency Medicine

## 2018-04-12 ENCOUNTER — Emergency Department (HOSPITAL_COMMUNITY): Payer: Medicaid Other

## 2018-04-12 ENCOUNTER — Encounter (HOSPITAL_COMMUNITY): Payer: Self-pay

## 2018-04-12 DIAGNOSIS — R509 Fever, unspecified: Secondary | ICD-10-CM | POA: Diagnosis not present

## 2018-04-12 DIAGNOSIS — Z87891 Personal history of nicotine dependence: Secondary | ICD-10-CM | POA: Diagnosis not present

## 2018-04-12 DIAGNOSIS — R55 Syncope and collapse: Secondary | ICD-10-CM | POA: Insufficient documentation

## 2018-04-12 DIAGNOSIS — I10 Essential (primary) hypertension: Secondary | ICD-10-CM | POA: Diagnosis not present

## 2018-04-12 LAB — URINALYSIS, ROUTINE W REFLEX MICROSCOPIC
BILIRUBIN URINE: NEGATIVE
Bacteria, UA: NONE SEEN
Glucose, UA: NEGATIVE mg/dL
Hgb urine dipstick: NEGATIVE
Ketones, ur: NEGATIVE mg/dL
LEUKOCYTES UA: NEGATIVE
NITRITE: NEGATIVE
Protein, ur: NEGATIVE mg/dL
SPECIFIC GRAVITY, URINE: 1.015 (ref 1.005–1.030)
pH: 6 (ref 5.0–8.0)

## 2018-04-12 LAB — CBC
HCT: 42 % (ref 36.0–46.0)
Hemoglobin: 13.6 g/dL (ref 12.0–15.0)
MCH: 26.3 pg (ref 26.0–34.0)
MCHC: 32.4 g/dL (ref 30.0–36.0)
MCV: 81.2 fL (ref 80.0–100.0)
NRBC: 0 % (ref 0.0–0.2)
Platelets: 222 10*3/uL (ref 150–400)
RBC: 5.17 MIL/uL — ABNORMAL HIGH (ref 3.87–5.11)
RDW: 14.1 % (ref 11.5–15.5)
WBC: 9.9 10*3/uL (ref 4.0–10.5)

## 2018-04-12 LAB — COMPREHENSIVE METABOLIC PANEL
ALT: 14 U/L (ref 0–44)
AST: 16 U/L (ref 15–41)
Albumin: 3.8 g/dL (ref 3.5–5.0)
Alkaline Phosphatase: 47 U/L (ref 38–126)
Anion gap: 12 (ref 5–15)
BUN: 17 mg/dL (ref 6–20)
CO2: 19 mmol/L — ABNORMAL LOW (ref 22–32)
Calcium: 9.4 mg/dL (ref 8.9–10.3)
Chloride: 107 mmol/L (ref 98–111)
Creatinine, Ser: 0.71 mg/dL (ref 0.44–1.00)
GFR calc Af Amer: >60 mL/min (ref >60)
GFR calc non Af Amer: >60 mL/min (ref >60)
Glucose, Bld: 132 mg/dL — ABNORMAL HIGH (ref 70–99)
Potassium: 3.6 mmol/L (ref 3.5–5.1)
Sodium: 138 mmol/L (ref 135–145)
TOTAL PROTEIN: 6.6 g/dL (ref 6.5–8.1)
Total Bilirubin: 0.4 mg/dL (ref 0.3–1.2)

## 2018-04-12 LAB — I-STAT BETA HCG BLOOD, ED (MC, WL, AP ONLY): I-stat hCG, quantitative: <5 m[IU]/mL (ref <5)

## 2018-04-12 IMAGING — CR DG CHEST 2V
2 series · 2 of 2 positions shown · non-contrast
Comparison: None.

CLINICAL DATA: Fever

EXAM:
CHEST - 2 VIEW

[chest lat]
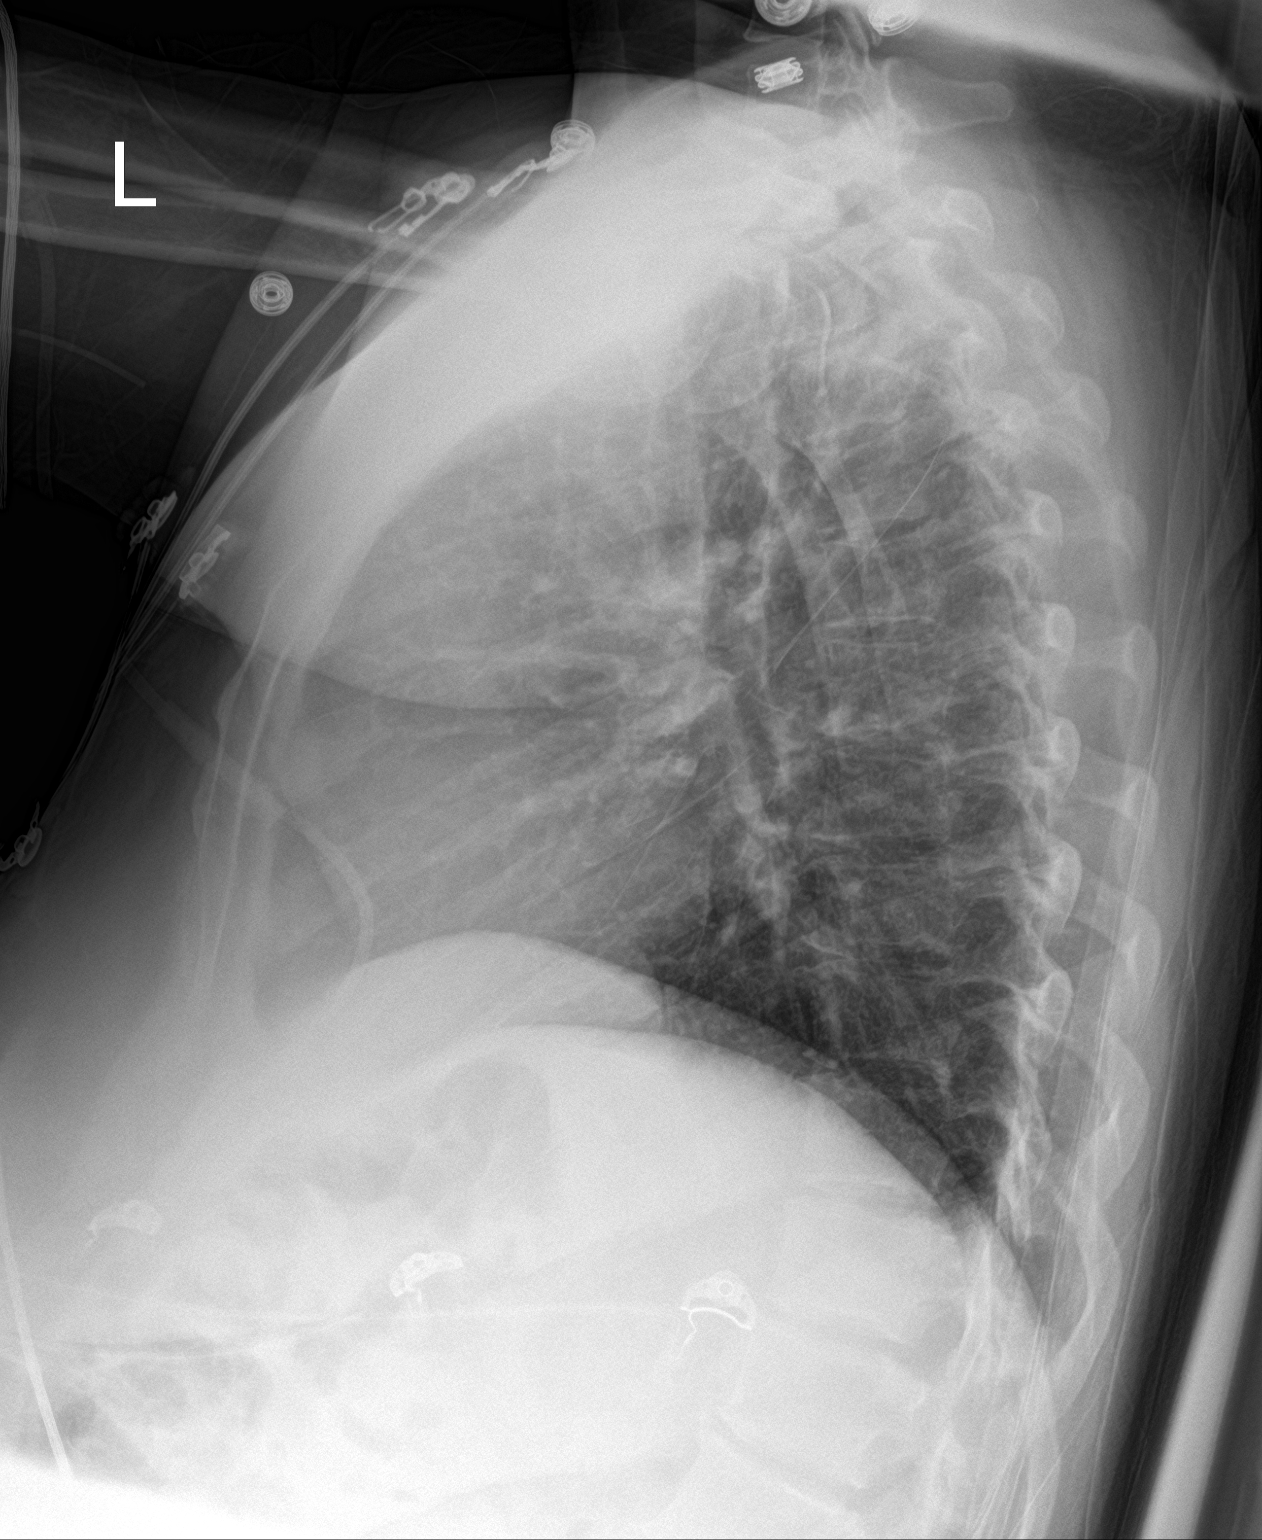

[chest ap]
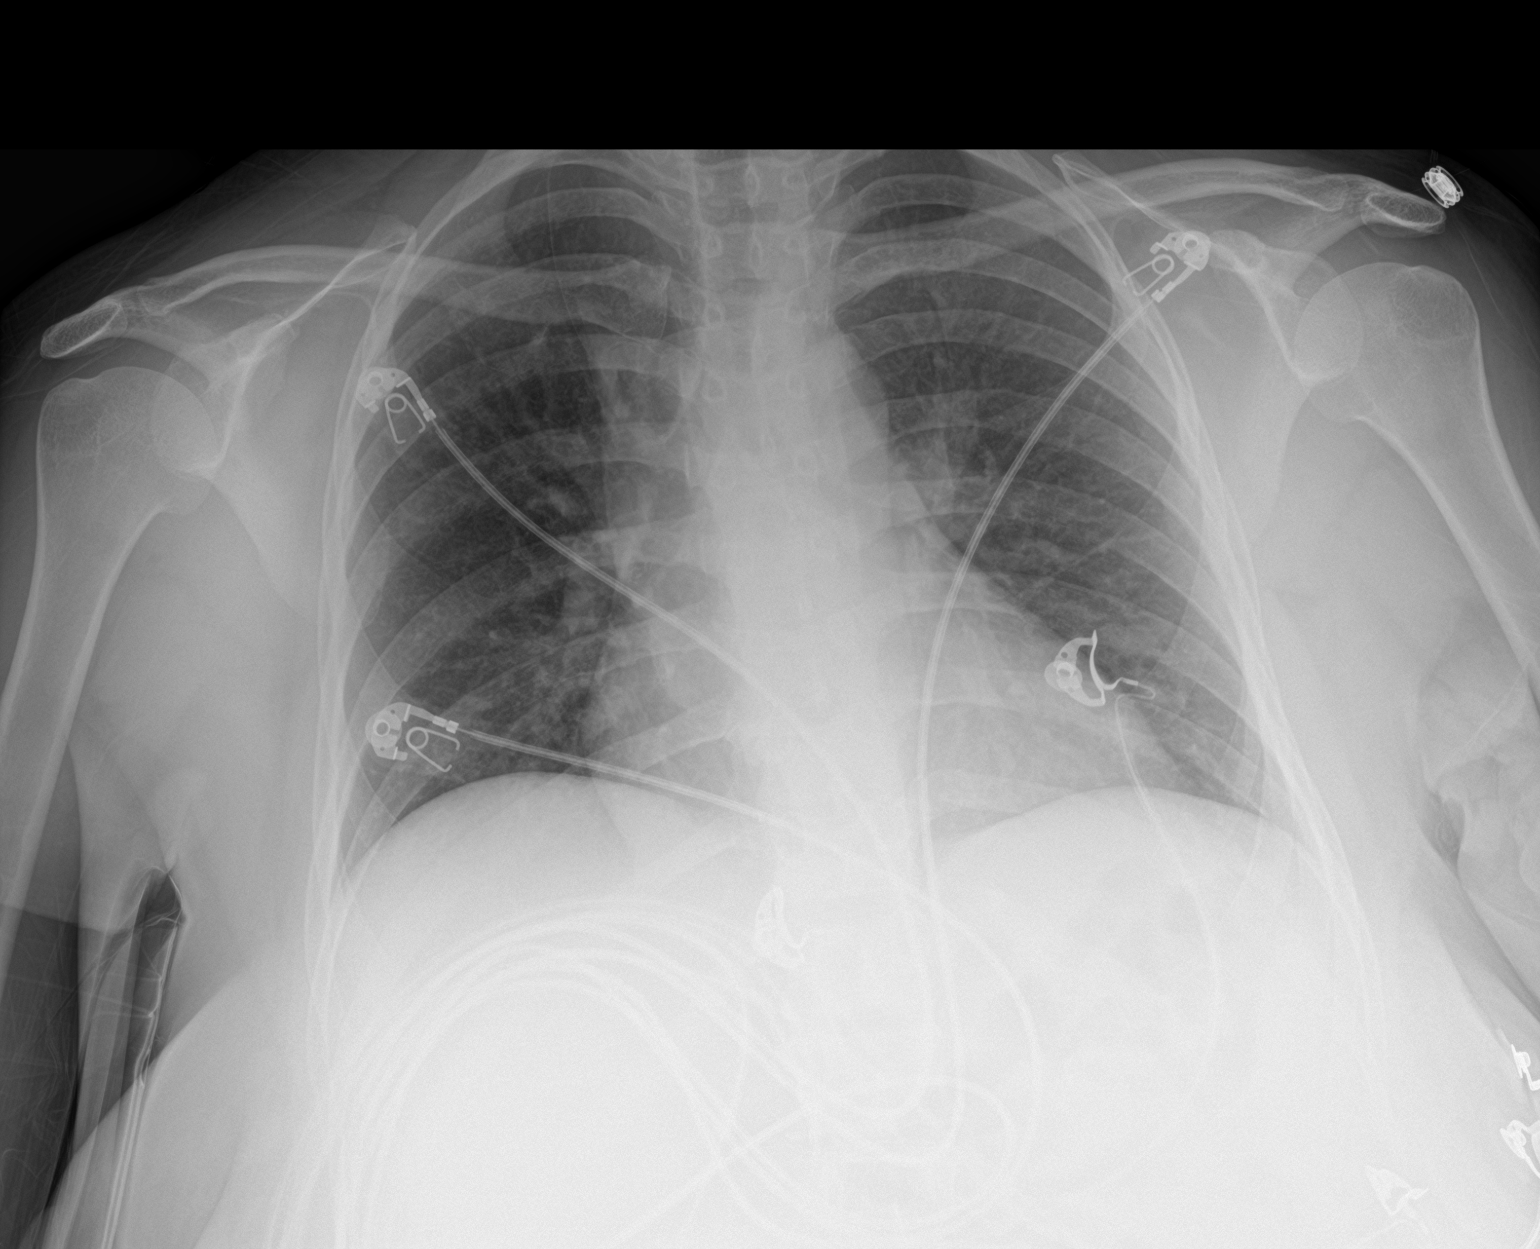

[2 of 2 positions shown; findings below may reference images not displayed]

FINDINGS: Normal heart size. Normal mediastinal contour. No pneumothorax. No
pleural effusion. Lungs appear clear, with no acute consolidative
airspace disease and no pulmonary edema.
IMPRESSION: No active cardiopulmonary disease.

## 2018-04-12 IMAGING — CR DG LUMBAR SPINE 2-3V
3 series · 3 of 3 positions shown · non-contrast
Comparison: None.

CLINICAL DATA: Fall at home on the left side with low back and left
hip pain.

EXAM:
LUMBAR SPINE - 2-3 VIEW

[l-spine ap]
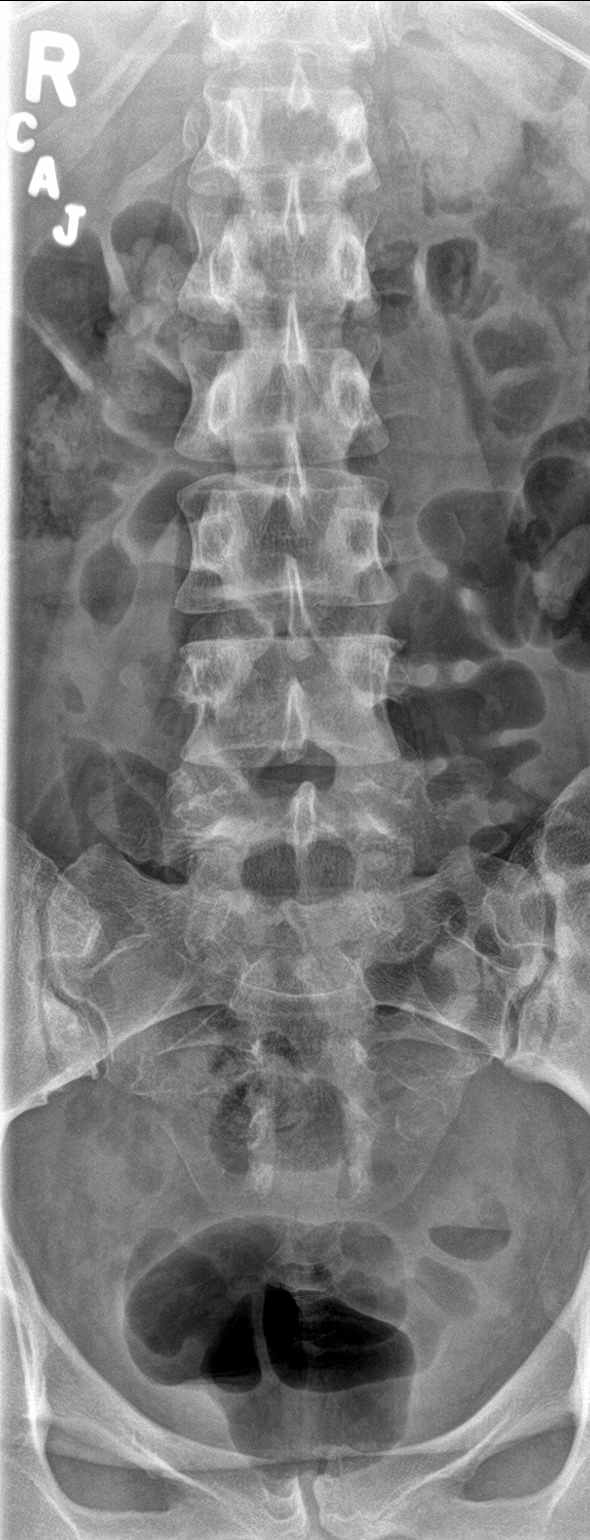

[l-spine lat]
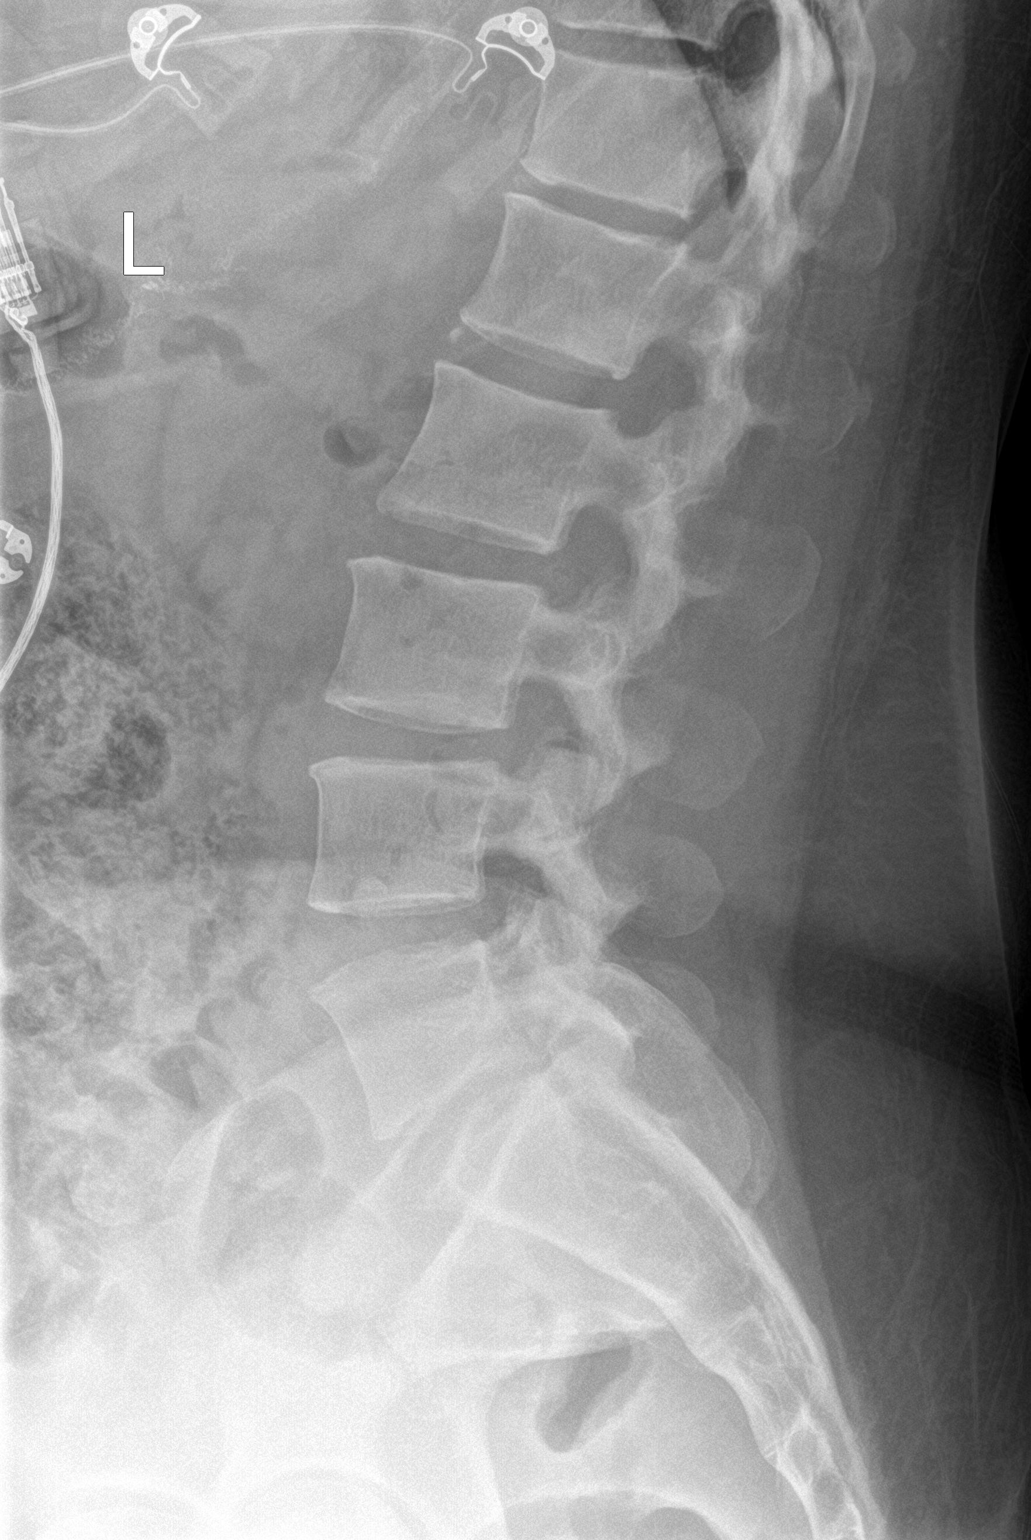

[l-spine spot]
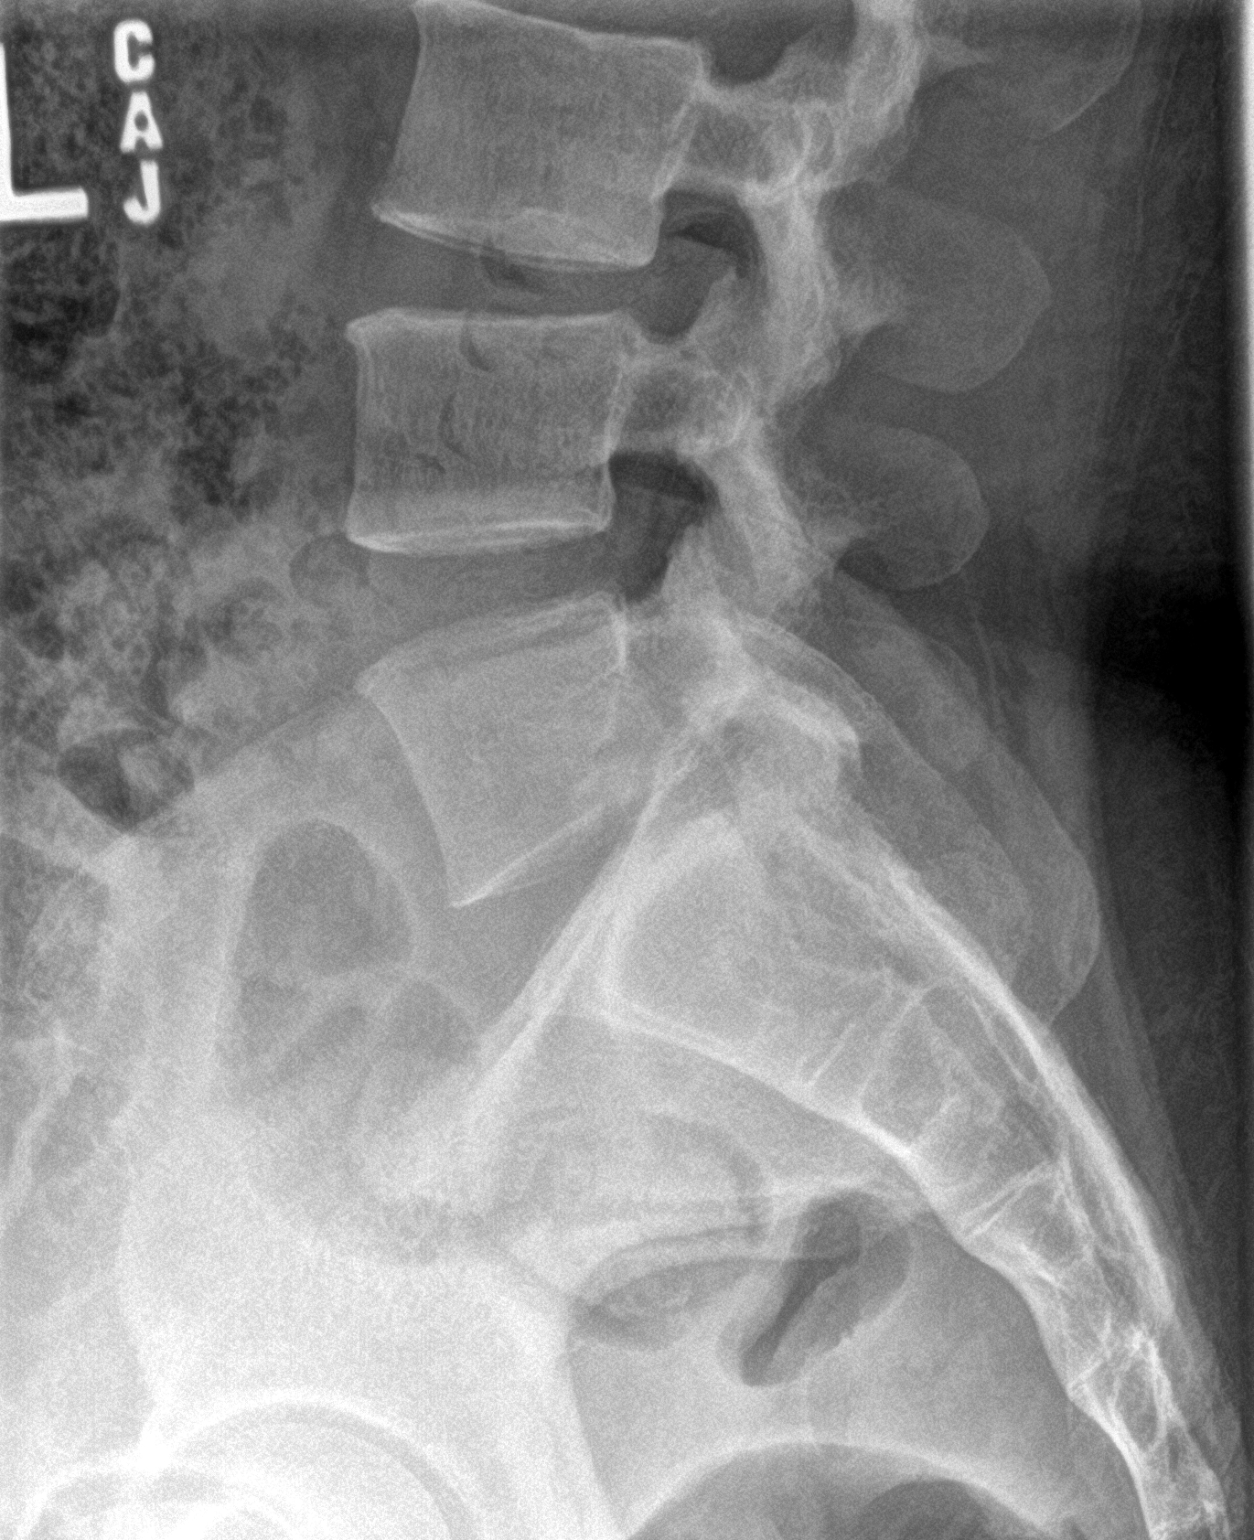

[3 of 3 positions shown; findings below may reference images not displayed]

FINDINGS: This report assumes 5 non rib-bearing lumbar vertebrae.

Lumbar vertebral body heights are preserved, with no fracture.

Minimal multilevel lumbar spondylosis with preserved lumbar disc
heights. No spondylolisthesis. No appreciable facet arthropathy. No
aggressive appearing focal osseous lesions.
IMPRESSION: 1. No lumbar spine fracture or spondylolisthesis.
2. Minimal multilevel lumbar spondylosis.

## 2018-04-12 IMAGING — CR DG HIP (WITH OR WITHOUT PELVIS) 2-3V*L*
3 series · 3 of 3 positions shown · non-contrast
Comparison: None.

CLINICAL DATA: Fall on the left side with left hip pain

EXAM:
DG HIP (WITH OR WITHOUT PELVIS) 2-3V LEFT

[pelvis ap]
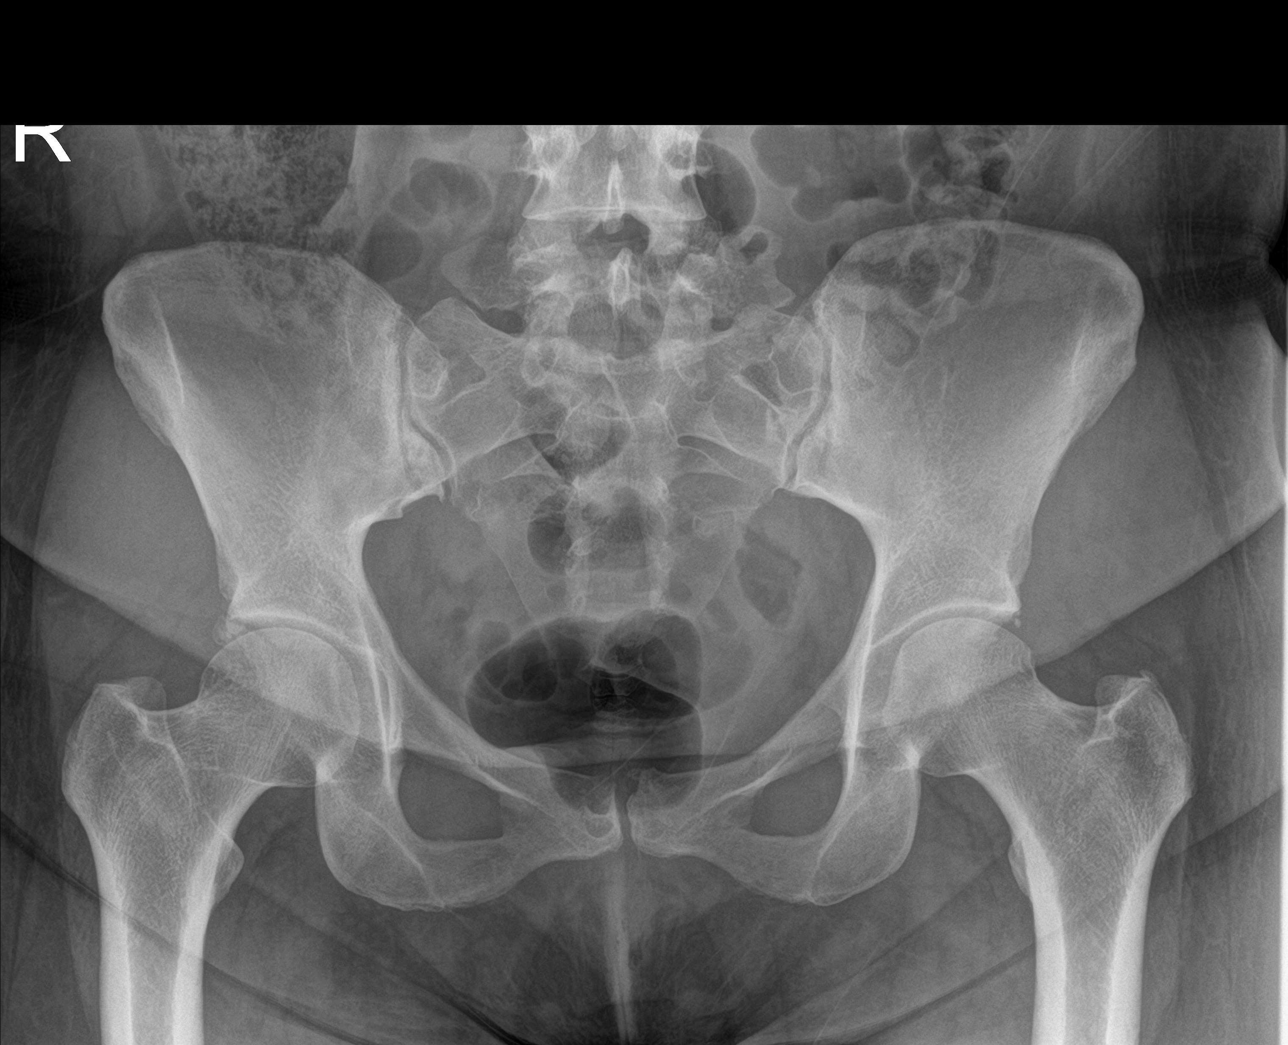

[hip ap]
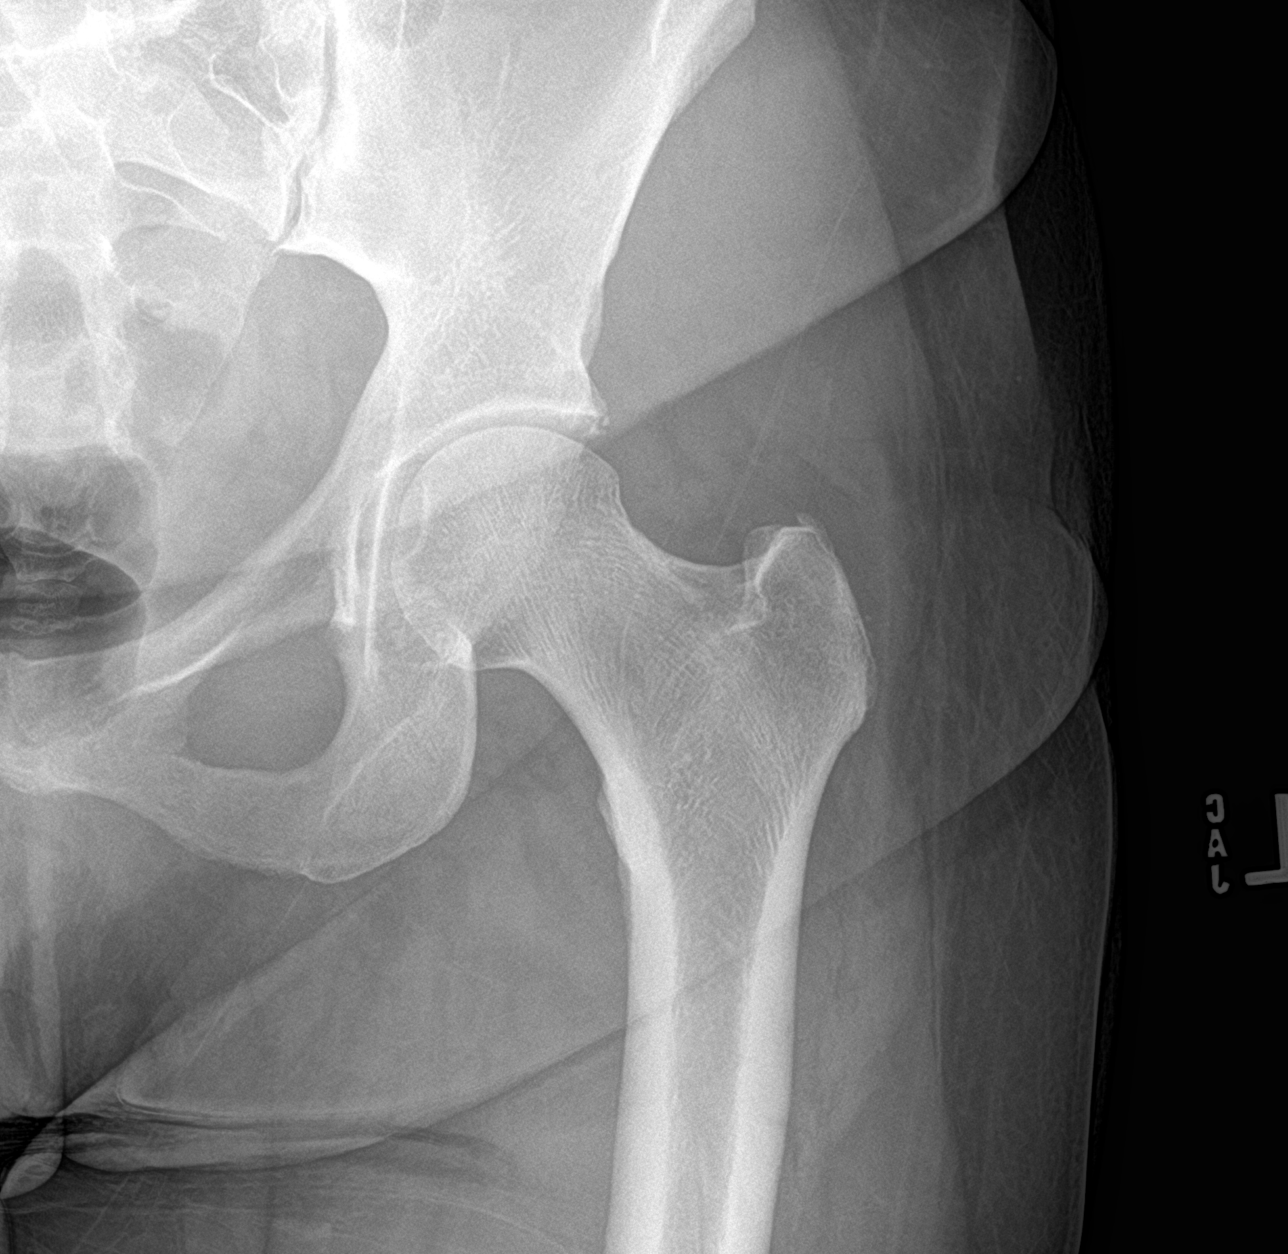

[hip lat]
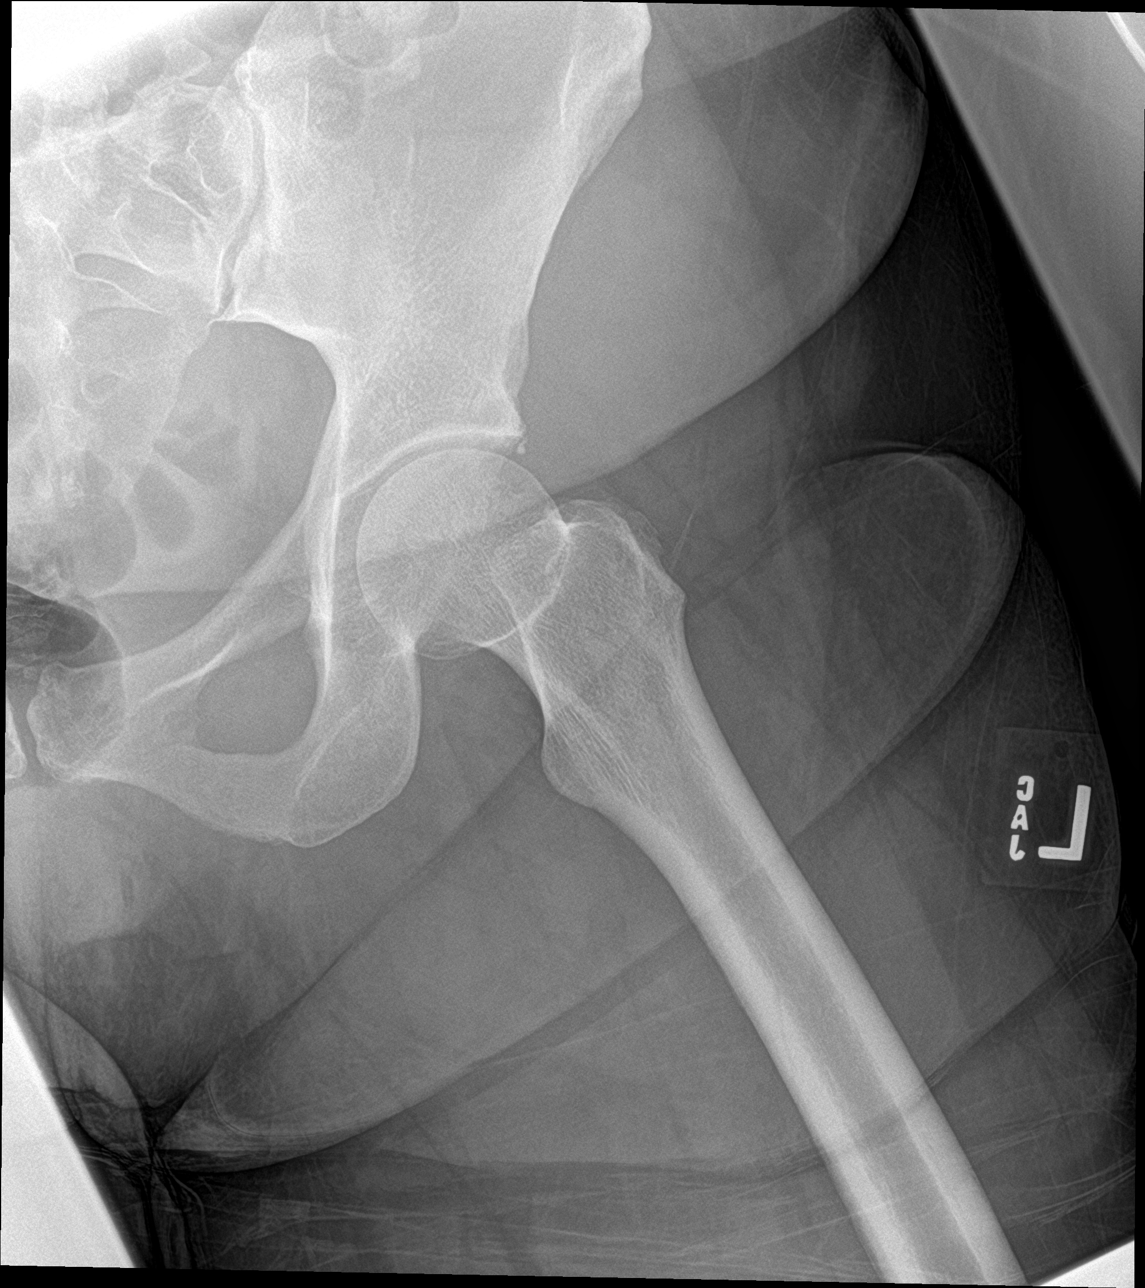

[3 of 3 positions shown; findings below may reference images not displayed]

FINDINGS: No pelvic fracture or diastasis. No left hip fracture or
dislocation. Nonspecific periacetabular calcifications in the
superior hip joints bilaterally without significant degenerative
arthropathy. No suspicious focal osseous lesions.
IMPRESSION: No fracture or malalignment.

## 2018-04-12 MED ORDER — SODIUM CHLORIDE 0.9 % IV BOLUS
1000.0000 mL | Freq: Once | INTRAVENOUS | Status: AC
  Administered 2018-04-12: 1000 mL via INTRAVENOUS

## 2018-04-12 NOTE — ED Notes (Signed)
EMS stated that patient was febrile for them at 101. Treated with 1000 mg tylenol en route to ED.

## 2018-04-12 NOTE — ED Provider Notes (Signed)
[facility] Provider Note  CSN: [id number] Arrival date & time: 04/12/18 0220  Chief Complaint(s) Loss of Consciousness and Fall  HPI Nichole Green is a 38 y.o. female   The history is provided by the patient.  Loss of Consciousness  Episode history:  Single Most recent episode:  Today Duration: <1 min. Progression:  Resolved Chronicity:  New Context: standing up   Witnessed: yes (but partner heard her fall and got to her quickly)   Relieved by: self resolved. Associated symptoms: dizziness   Associated symptoms: no chest pain, no diaphoresis, no difficulty breathing, no fever, no focal sensory loss, no headaches, no nausea, no rectal bleeding, no seizures and no shortness of breath   Associated symptoms comment:  Palpitation  Patient's partner reports that they were smoking hookah.  She got up to go to the bathroom and that is when she had the syncopal episode.  They deny any recent fevers or infections.  No nausea or vomiting.  No cough or congestion.  No chest pain or shortness of breath.  No abdominal pain.  No urinary symptoms.  Patient did report having tachycardia after the episode.  States that she has had intermittent palpitations and tachycardia for the past several weeks.  Patient is endorsing left side hip and back pain due to the fall.  Denies any neck pain, chest pain, abdominal pain, headache.  EMS reported a temperature of 101 prior to arrival.  Past Medical History Past Medical History:  Diagnosis Date  . Headache    only when B/P elevated   . Hypertension   . Infection    UTI  . Pregnancy induced hypertension    Patient Active Problem List   Diagnosis Date Noted  . Chronic hypertension 06/20/2016  . Postpartum care following vaginal delivery 06/20/2016  . GDM (gestational diabetes mellitus) 04/09/2016  . Uterine fibroid 02/09/2016  . Chronic hypertension during pregnancy, antepartum 10/31/2015   Home  Medication(s) Prior to Admission medications   Medication Sig Start Date End Date Taking? Authorizing Provider  amLODipine (NORVASC) 10 MG tablet Take 1 tablet (10 mg total) by mouth daily. Patient not taking: Reported on 08/09/2016 05/24/16   Kandis Cocking A, CNM  metroNIDAZOLE (FLAGYL) 500 MG tablet Take 1 tablet (500 mg total) by mouth 2 (two) times daily. 12/09/17   Constant, Peggy, MD  metroNIDAZOLE (METROGEL) 0.75 % vaginal gel Place 1 Applicatorful vaginally at bedtime. Apply one applicatorful to vagina at bedtime for 5 days 04/08/18   Shelly Bombard, MD  oxymetazoline (AFRIN) 0.05 % nasal spray Place 1 spray into both nostrils 2 (two) times daily as needed for congestion.    [provider]                                                                                                                                    Past Surgical History Past Surgical History:  Procedure  Laterality Date  . BREAST REDUCTION SURGERY    . REDUCTION MAMMAPLASTY Bilateral 2010  . ROBOTIC ASSISTED LAPAROSCOPIC OVARIAN CYSTECTOMY N/A 01/17/2016   Procedure: XI ROBOTIC ASSISTED LAPAROSCOPIC OVARIAN CYSTECTOMY;  Surgeon: Everitt Amber, MD;  Location: [facility];  Service: Gynecology;  Laterality: N/A;   Family History Family History  Problem Relation Age of Onset  . Hypertension Mother   . Diabetes Mother     Social History Social History   Tobacco Use  . Smoking status: Former Smoker    Years: 2.00    Types: Cigarettes    Last attempt to quit: 10/25/2015    Years since quitting: 2.4  . Smokeless tobacco: Never Used  . Tobacco comment: hookah  Substance Use Topics  . Alcohol use: No  . Drug use: No   Allergies Patient has no known allergies.  Review of Systems Review of Systems  Constitutional: Negative for diaphoresis and fever.  Respiratory: Negative for shortness of breath.   Cardiovascular: Positive for syncope. Negative for chest pain.  Gastrointestinal: Negative for nausea.    Neurological: Positive for dizziness. Negative for seizures and headaches.   All other systems are reviewed and are negative for acute change except as noted in the HPI  Physical Exam Vital Signs  I have reviewed the triage vital signs BP 118/78 (BP Location: Right Arm)   Pulse 66   Temp 98.9 F (37.2 C) (Oral)   Resp 18   LMP 03/27/2018 (Approximate)   SpO2 100%   Physical Exam Vitals signs reviewed.  Constitutional:      General: She is not in acute distress.    Appearance: She is well-developed. She is not diaphoretic.  HENT:     Head: Normocephalic and atraumatic.     Nose: Nose normal.  Eyes:     General: No scleral icterus.       Right eye: No discharge.        Left eye: No discharge.     Conjunctiva/sclera: Conjunctivae normal.     Pupils: Pupils are equal, round, and reactive to light.  Neck:     Musculoskeletal: Normal range of motion and neck supple.  Cardiovascular:     Rate and Rhythm: Normal rate and regular rhythm.     Heart sounds: No murmur. No friction rub. No gallop.   Pulmonary:     Effort: Pulmonary effort is normal. No respiratory distress.     Breath sounds: Normal breath sounds. No stridor. No rales.  Abdominal:     General: There is no distension.     Palpations: Abdomen is soft.     Tenderness: There is no abdominal tenderness.  Musculoskeletal:     Lumbar back: She exhibits tenderness. She exhibits no bony tenderness.       Back:     Left upper leg: She exhibits tenderness. She exhibits no deformity.       Legs:  Skin:    General: Skin is warm and dry.     Findings: No erythema or rash.  Neurological:     Mental Status: She is alert and oriented to person, place, and time.     ED Results and Treatments Labs (all labs ordered are listed, but only abnormal results are displayed) Labs Reviewed  CBC - Abnormal; Notable for the following components:      Result Value   RBC 5.17 (*)    All other components within normal limits   COMPREHENSIVE METABOLIC PANEL - Abnormal; Notable for the following components:  CO2 19 (*)    Glucose, Bld 132 (*)    All other components within normal limits  URINALYSIS, ROUTINE W REFLEX MICROSCOPIC  I-STAT BETA HCG BLOOD, ED ([facility], [facility], [facility] ONLY)                                                                                                                         EKG  EKG Interpretation  Date/Time:  Saturday April 12 2018 02:29:56 EST Ventricular Rate:  76 PR Interval:    QRS Duration: 71 QT Interval:  373 QTC Calculation: 420 R Axis:   50 Text Interpretation:  Sinus rhythm Borderline T wave abnormalities No significant change since last tracing Confirmed by Addison Lank [phone]) on 04/12/2018 3:58:52 AM      Radiology Dg Chest 2 View  Result Date: 04/12/2018 CLINICAL DATA:  Fever EXAM: CHEST - 2 VIEW COMPARISON:  None. FINDINGS: Normal heart size. Normal mediastinal contour. No pneumothorax. No pleural effusion. Lungs appear clear, with no acute consolidative airspace disease and no pulmonary edema. IMPRESSION: No active cardiopulmonary disease. Electronically Signed   By: Ilona Sorrel M.D.   On: 04/12/2018 04:49   Dg Lumbar Spine 2-3 Views  Result Date: 04/12/2018 CLINICAL DATA:  Fall at home on the left side with low back and left hip pain. EXAM: LUMBAR SPINE - 2-3 VIEW COMPARISON:  None. FINDINGS: This report assumes 5 non rib-bearing lumbar vertebrae. Lumbar vertebral body heights are preserved, with no fracture. Minimal multilevel lumbar spondylosis with preserved lumbar disc heights. No spondylolisthesis. No appreciable facet arthropathy. No aggressive appearing focal osseous lesions. IMPRESSION: 1. No lumbar spine fracture or spondylolisthesis. 2. Minimal multilevel lumbar spondylosis. Electronically Signed   By: Ilona Sorrel M.D.   On: 04/12/2018 04:52   Dg Hip Unilat W Or W/o Pelvis 2-3 Views Left  Result Date: 04/12/2018 CLINICAL DATA:  Fall on the left side with  left hip pain EXAM: DG HIP (WITH OR WITHOUT PELVIS) 2-3V LEFT COMPARISON:  None. FINDINGS: No pelvic fracture or diastasis. No left hip fracture or dislocation. Nonspecific periacetabular calcifications in the superior hip joints bilaterally without significant degenerative arthropathy. No suspicious focal osseous lesions. IMPRESSION: No fracture or malalignment. Electronically Signed   By: Ilona Sorrel M.D.   On: 04/12/2018 04:51   Pertinent labs & imaging results that were available during my care of the patient were reviewed by me and considered in my medical decision making (see chart for details).  Medications Ordered in ED Medications  sodium chloride 0.9 % bolus 1,000 mL (0 mLs Intravenous Stopped 04/12/18 0414)  Procedures Procedures  (including critical care time)  Medical Decision Making / ED Course I have reviewed the nursing notes for this encounter and the patient's prior records (if available in EHR or on provided paperwork).    EKG without acute ischemic changes, dysrhythmia, blocks, Brugada, HOCM, epsilon waves.  Beta hCG negative.  Labs reassuring without leukocytosis or anemia.  No significant electrolyte derangements or renal sufficiency.  Chest x-ray negative.  UA negative.  Plain films of the left hip and lower back negative.  Patient provided with IV fluids.  Patient was able to ambulate without complication afterwards.  Episode appears to be vasovagal versus orthostasis.  Recommended PCP follow-up for the intermittent palpitations and tachycardia she reported.  The patient appears reasonably screened and/or stabilized for discharge and I doubt any other medical condition or other [facility] requiring further screening, evaluation, or treatment in the ED at this time prior to discharge.  The patient is safe for discharge with strict return  precautions.   Final Clinical Impression(s) / ED Diagnoses Final diagnoses:  Fever  Syncope and collapse   Disposition: Discharge  Condition: Good  I have discussed the results, Dx and Tx plan with the patient who expressed understanding and agree(s) with the plan. Discharge instructions discussed at great length. The patient was given strict return precautions who verbalized understanding of the instructions. No further questions at time of discharge.    ED Discharge Orders    None       Follow Up: Primary care provider  Schedule an appointment as soon as possible for a visit  For close follow up to assess for fainting      This chart was dictated using voice recognition software.  Despite best efforts to proofread,  errors can occur which can change the documentation meaning.   Fatima Blank, MD 04/12/18 [phone]

## 2018-04-12 NOTE — ED Notes (Signed)
ED Provider at bedside. 

## 2018-04-12 NOTE — ED Notes (Signed)
Pt stated she felt dizzy and her left hip hurt when ambulating.

## 2018-04-12 NOTE — ED Notes (Signed)
Urine culture sent down if needed.

## 2018-04-12 NOTE — ED Triage Notes (Signed)
Pt here for a fall at home.  Stated she was feeling dizzy and her heart racing prior to the fall.  Stated she passed out.  Complains of lower back pain.  No thinners.

## 2018-04-12 NOTE — ED Notes (Signed)
Patient verbalizes understanding of discharge instructions. Opportunity for questioning and answers were provided. Armband removed by staff, pt discharged from ED ambulatory.   

## 2018-06-11 ENCOUNTER — Ambulatory Visit: Payer: Medicaid Other | Admitting: Obstetrics and Gynecology

## 2018-06-11 ENCOUNTER — Other Ambulatory Visit (HOSPITAL_COMMUNITY)
Admission: RE | Admit: 2018-06-11 | Discharge: 2018-06-11 | Disposition: A | Discharge status: Home / Self Care | Payer: Medicaid Other | Source: Ambulatory Visit | Attending: Obstetrics and Gynecology | Admitting: Obstetrics and Gynecology

## 2018-06-11 ENCOUNTER — Other Ambulatory Visit: Payer: Self-pay

## 2018-06-11 ENCOUNTER — Encounter: Payer: Self-pay | Admitting: Obstetrics and Gynecology

## 2018-06-11 VITALS — BP 102/72 | HR 76 | Ht 63.0 in | Wt 172.3 lb

## 2018-06-11 DIAGNOSIS — Z01419 Encounter for gynecological examination (general) (routine) without abnormal findings: Secondary | ICD-10-CM | POA: Insufficient documentation

## 2018-06-11 DIAGNOSIS — Z0000 Encounter for general adult medical examination without abnormal findings: Secondary | ICD-10-CM | POA: Diagnosis not present

## 2018-06-11 NOTE — Progress Notes (Signed)
GYN presents for vaginal pain, itching, redness, burning and peeling. Last PAP 12/11/15

## 2018-06-11 NOTE — Progress Notes (Signed)
Subjective:     Nichole Green is a 38 y.o. female P3 with BMI 30 who is here for a comprehensive physical exam. The patient reports presence of vaginal pruritis for the past 3 days without relief from monistat. She denies any pelvic pain or abnormal discharge. She is sexually active using Nexplanon for contraception and reports a few days of spotting monthly.  Past Medical History:  Diagnosis Date  . Headache    only when B/P elevated   . Hypertension   . Infection    UTI  . Pregnancy induced hypertension    Past Surgical History:  Procedure Laterality Date  . BREAST REDUCTION SURGERY    . REDUCTION MAMMAPLASTY Bilateral 2010  . ROBOTIC ASSISTED LAPAROSCOPIC OVARIAN CYSTECTOMY N/A 01/17/2016   Procedure: XI ROBOTIC ASSISTED LAPAROSCOPIC OVARIAN CYSTECTOMY;  Surgeon: Everitt Amber, MD;  Location: [facility];  Service: Gynecology;  Laterality: N/A;   Family History  Problem Relation Age of Onset  . Hypertension Mother   . Diabetes Mother     Social History   Socioeconomic History  . Marital status: Married    Spouse name: Not on file  . Number of children: Not on file  . Years of education: Not on file  . Highest education level: Not on file  Occupational History  . Not on file  Social Needs  . Financial resource strain: Not on file  . Food insecurity:    Worry: Not on file    Inability: Not on file  . Transportation needs:    Medical: Not on file    Non-medical: Not on file  Tobacco Use  . Smoking status: Former Smoker    Years: 2.00    Types: Cigarettes    Last attempt to quit: 10/25/2015    Years since quitting: 2.6  . Smokeless tobacco: Never Used  . Tobacco comment: hookah  Substance and Sexual Activity  . Alcohol use: No  . Drug use: No  . Sexual activity: Yes    Birth control/protection: None  Lifestyle  . Physical activity:    Days per week: Not on file    Minutes per session: Not on file  . Stress: Not on file  Relationships  . Social connections:    Talks  on phone: Not on file    Gets together: Not on file    Attends religious service: Not on file    Active member of club or organization: Not on file    Attends meetings of clubs or organizations: Not on file    Relationship status: Not on file  . Intimate partner violence:    Fear of current or ex partner: Not on file    Emotionally abused: Not on file    Physically abused: Not on file    Forced sexual activity: Not on file  Other Topics Concern  . Not on file  Social History Narrative  . Not on file   Health Maintenance  Topic Date Due  . INFLUENZA VACCINE  10/24/2017  . PAP SMEAR-Modifier  12/13/2018  . TETANUS/TDAP  04/03/2026  . HIV Screening  Completed       Review of Systems Pertinent items are noted in HPI.   Objective:  Blood pressure 102/72, pulse 76, height 5\' 3"  (1.6 m), weight 172 lb 4.8 oz (78.2 kg), last menstrual period 06/05/2018.     GENERAL: Well-developed, well-nourished female in no acute distress.  HEENT: Normocephalic, atraumatic. Sclerae anicteric.  NECK: Supple. Normal thyroid.  LUNGS: Clear to auscultation  bilaterally.  HEART: Regular rate and rhythm. BREASTS: Symmetric in size. No palpable masses or lymphadenopathy, skin changes, or nipple drainage. ABDOMEN: Soft, nontender, nondistended. No organomegaly. PELVIC: Normal external female genitalia with a few small excoriations. Vagina is erythematous and rugated.  Normal discharge. Normal appearing cervix. Uterus is normal in size. No adnexal mass or tenderness. EXTREMITIES: No cyanosis, clubbing, or edema, 2+ distal pulses.    Assessment:    Healthy female exam.      Plan:    38 yo here for annual exam with vaginitis - pap smear collected - wet prep and cultures collected - Advised patient to apply skin protectant cream to vulva as a soothing measure pending results - Patient will be contacted with abnormal results  See After Visit Summary for Counseling Recommendations

## 2018-06-14 LAB — CERVICOVAGINAL ANCILLARY ONLY
Bacterial vaginitis: NEGATIVE
CANDIDA VAGINITIS: POSITIVE — AB
Chlamydia: NEGATIVE
Neisseria Gonorrhea: NEGATIVE
Trichomonas: NEGATIVE

## 2018-06-14 MED ORDER — FLUCONAZOLE 150 MG PO TABS: 150.0000 mg | ORAL_TABLET | Freq: Once | ORAL | 0 refills | Status: AC

## 2018-06-14 NOTE — Addendum Note (Signed)
Addended by: Mora Bellman on: 06/14/2018 08:26 PM   Modules accepted: Orders

## 2018-06-16 LAB — CYTOLOGY - PAP
Diagnosis: NEGATIVE
HPV: NOT DETECTED

## 2018-07-16 ENCOUNTER — Ambulatory Visit
Admission: RE | Admit: 2018-07-16 | Discharge: 2018-07-16 | Disposition: A | Discharge status: Home / Self Care | Payer: Medicaid Other | Source: Ambulatory Visit | Attending: Internal Medicine | Admitting: Internal Medicine

## 2018-07-16 ENCOUNTER — Other Ambulatory Visit: Payer: Self-pay | Admitting: Internal Medicine

## 2018-07-16 ENCOUNTER — Other Ambulatory Visit: Payer: Self-pay

## 2018-07-16 DIAGNOSIS — M25562 Pain in left knee: Secondary | ICD-10-CM

## 2018-07-16 IMAGING — CR LEFT KNEE - COMPLETE 4+ VIEW
4 series · 4 of 4 positions shown · non-contrast
Comparison: None.

CLINICAL DATA: Left knee pain for several weeks, no known injury,
initial encounter

EXAM:
LEFT KNEE - COMPLETE 4+ VIEW

[t knee ap left]
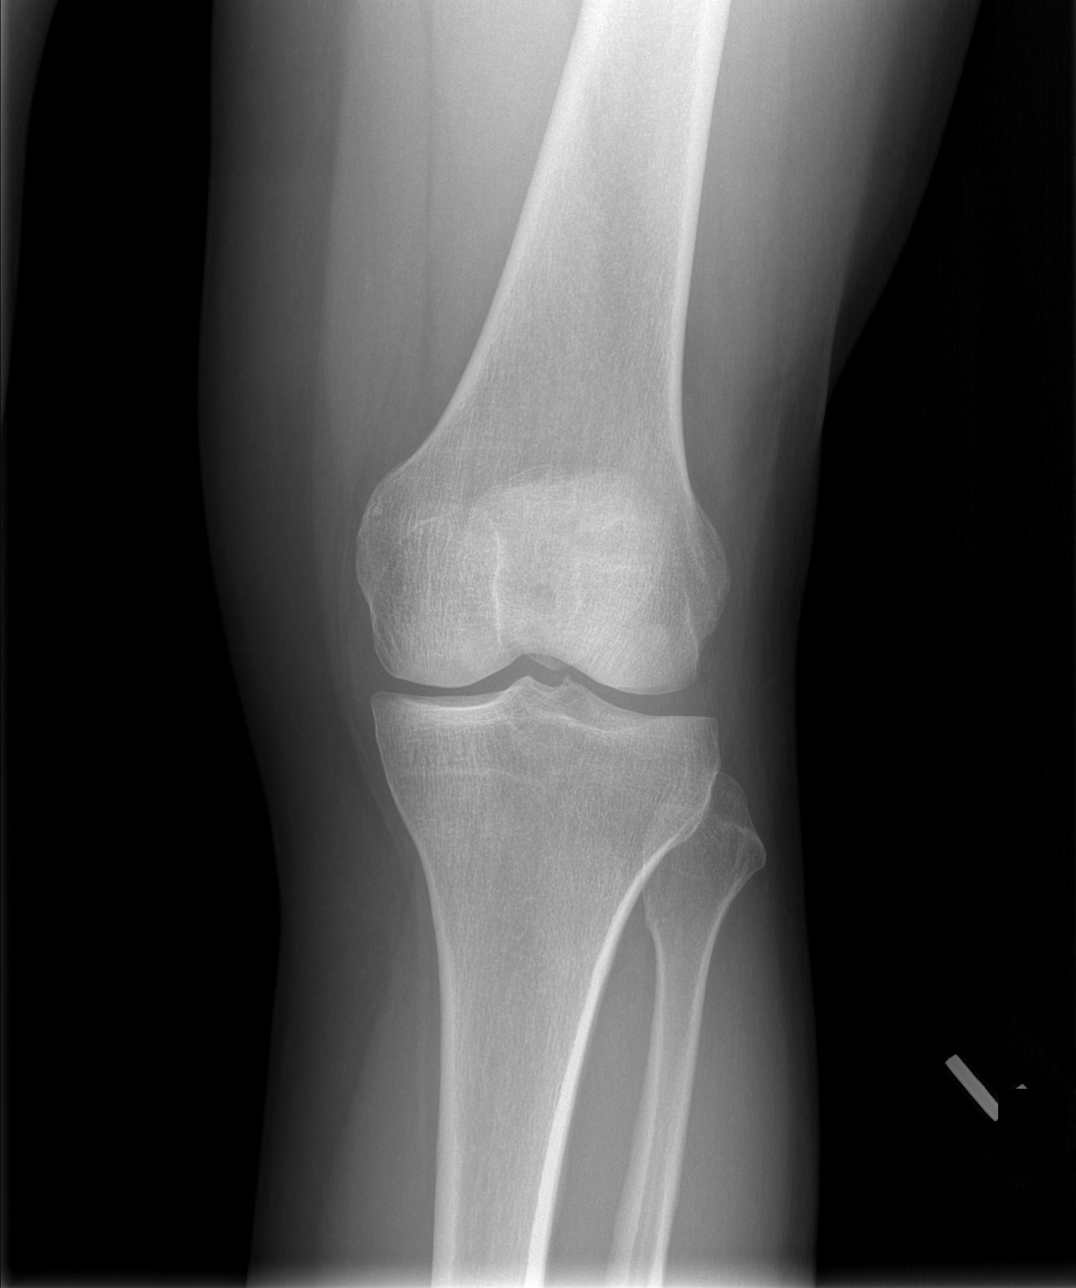

[t knee oblique left (1 of 2)]
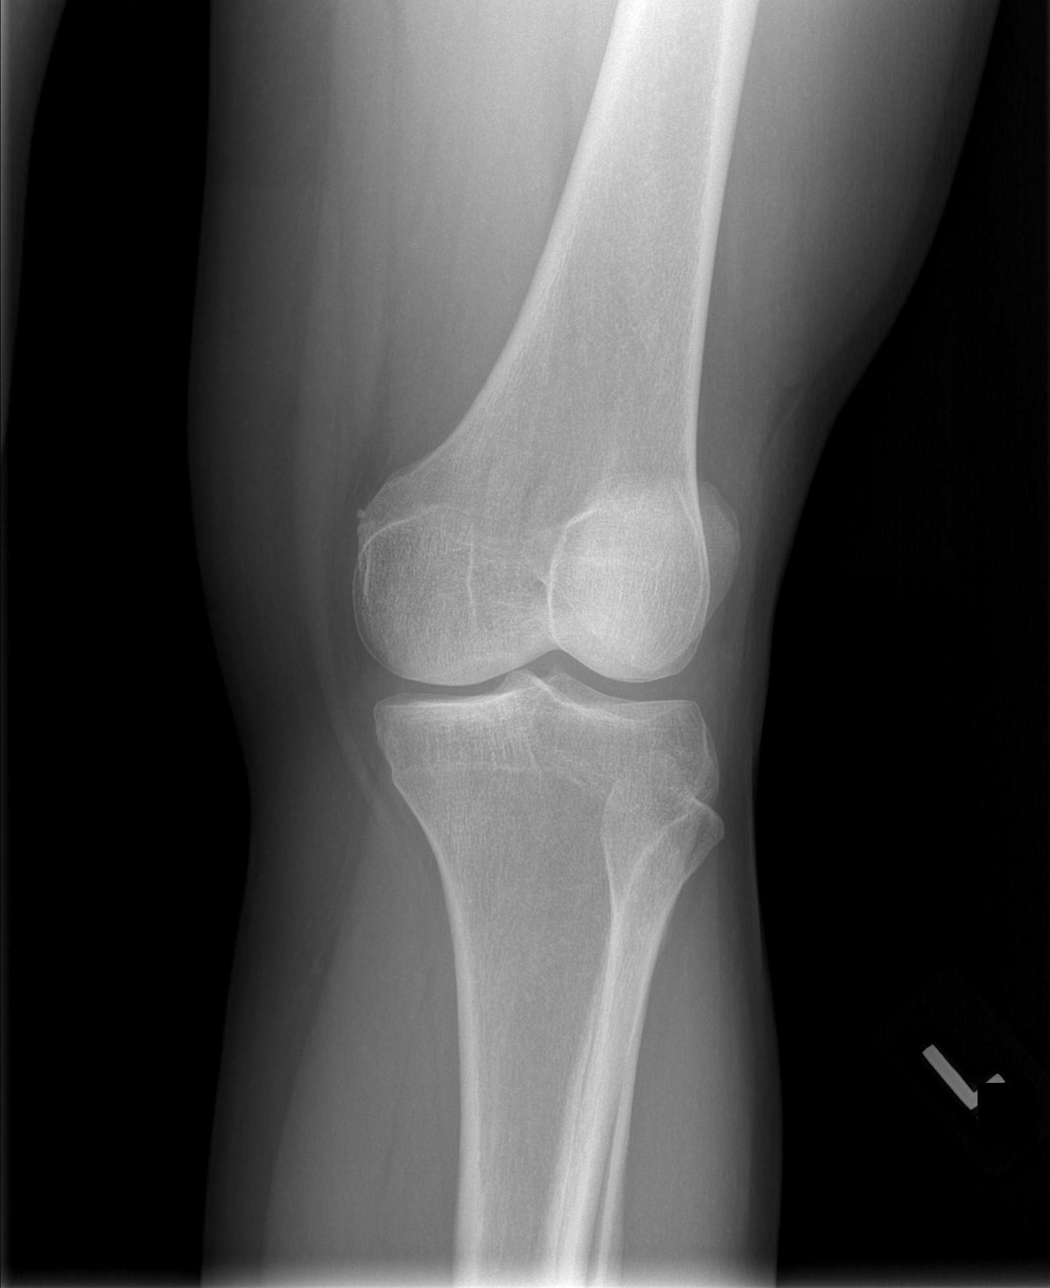

[t knee oblique left (2 of 2)]
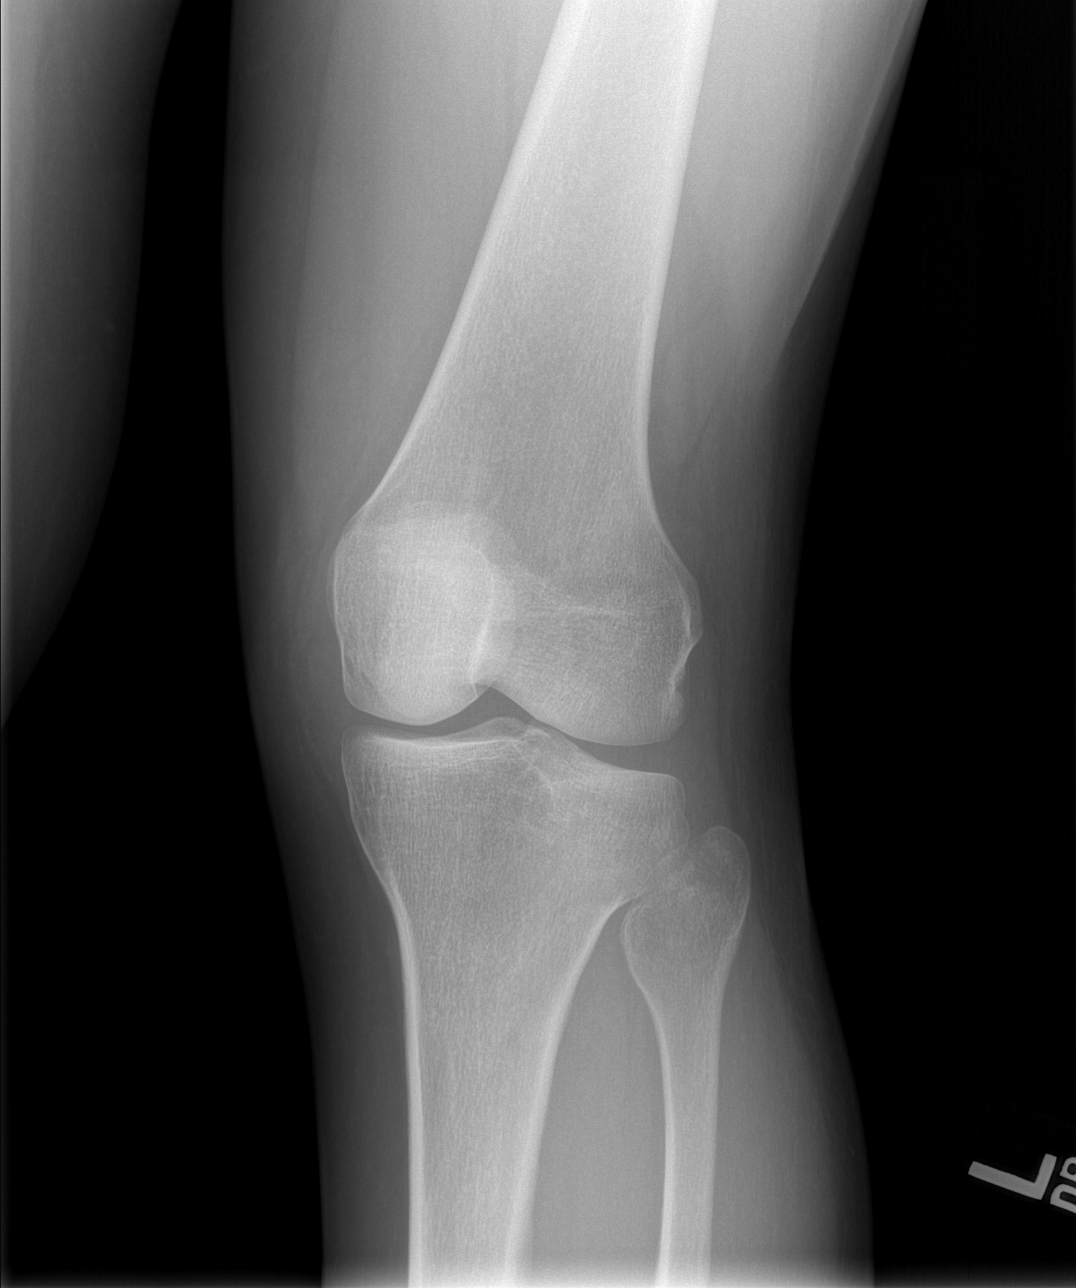

[t knee lat left]
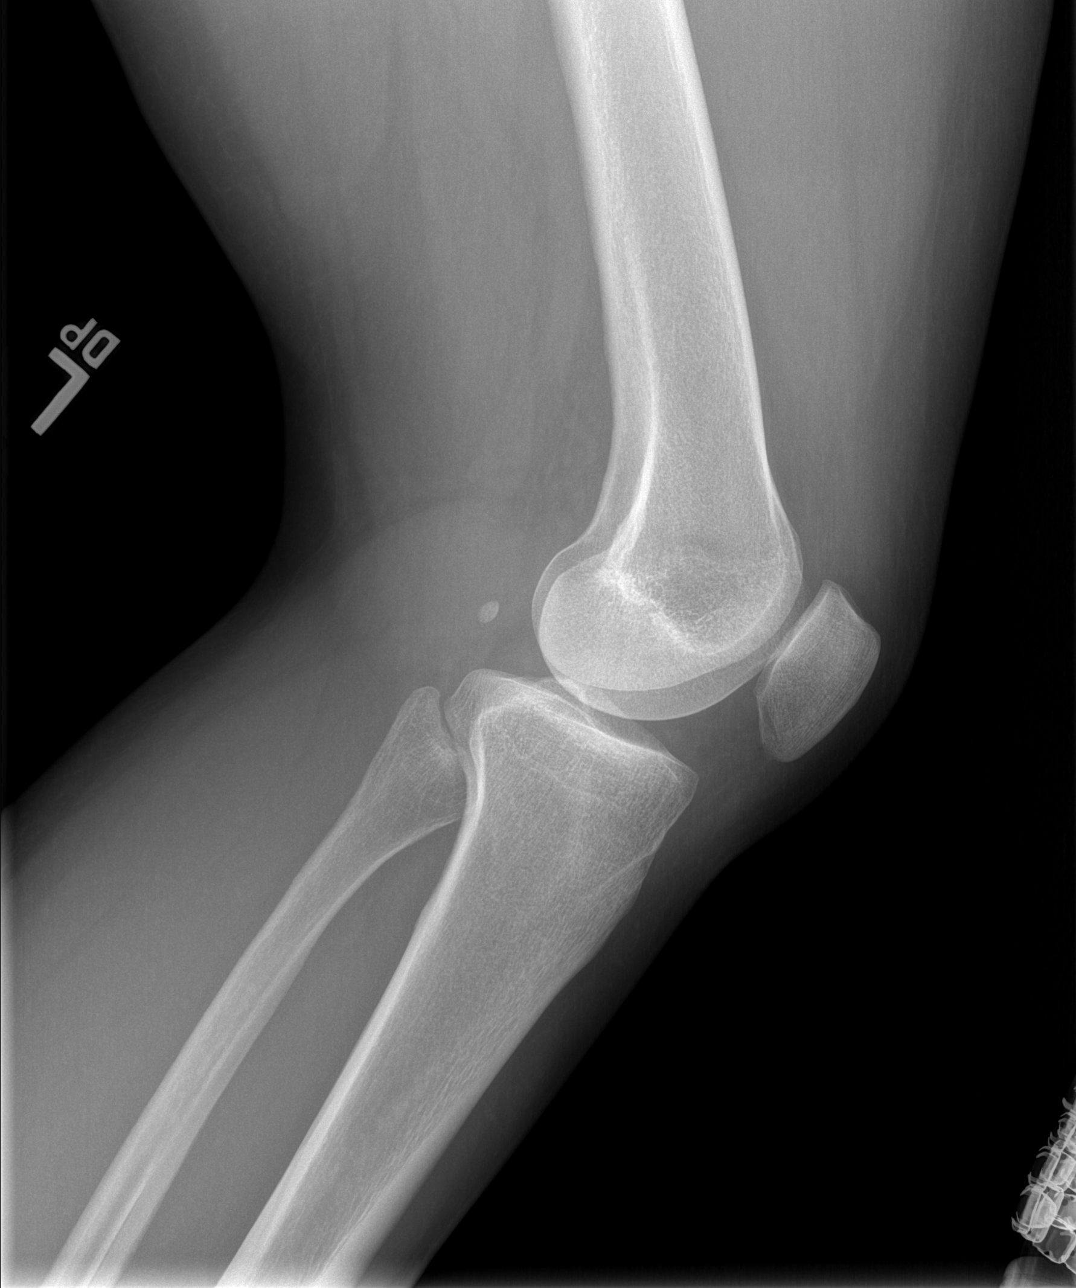

[4 of 4 positions shown; findings below may reference images not displayed]

FINDINGS: No evidence of fracture, dislocation, or joint effusion. No evidence
of arthropathy or other focal bone abnormality. Soft tissues are
unremarkable.
IMPRESSION: No acute abnormality noted.

## 2019-02-17 ENCOUNTER — Ambulatory Visit: Payer: Medicaid Other

## 2019-02-17 ENCOUNTER — Other Ambulatory Visit: Payer: Self-pay | Admitting: *Deleted

## 2019-02-17 DIAGNOSIS — B3731 Acute candidiasis of vulva and vagina: Secondary | ICD-10-CM

## 2019-02-17 DIAGNOSIS — B373 Candidiasis of vulva and vagina: Secondary | ICD-10-CM

## 2019-02-17 MED ORDER — FLUCONAZOLE 150 MG PO TABS: 150.0000 mg | ORAL_TABLET | Freq: Once | ORAL | 0 refills | Status: AC

## 2019-02-17 NOTE — Progress Notes (Signed)
Pt called to office with vaginal itching and d/c.  Diflucan sent to pharmacy per protocol.  Pt advised to call office next week if symptoms do not resolve.

## 2019-03-06 ENCOUNTER — Other Ambulatory Visit: Payer: Self-pay

## 2019-03-06 ENCOUNTER — Ambulatory Visit
Admission: RE | Admit: 2019-03-06 | Discharge: 2019-03-06 | Disposition: A | Discharge status: Home / Self Care | Payer: Medicaid Other | Source: Ambulatory Visit | Attending: Internal Medicine | Admitting: Internal Medicine

## 2019-03-06 ENCOUNTER — Other Ambulatory Visit: Payer: Self-pay | Admitting: Internal Medicine

## 2019-03-06 DIAGNOSIS — M545 Low back pain, unspecified: Secondary | ICD-10-CM

## 2019-03-06 IMAGING — CR DG SACRUM/COCCYX 2+V
3 series · 3 of 3 positions shown · non-contrast
Comparison: None.

CLINICAL DATA: Chair broke and fell straight to buttocks, sacrum
and coccygeal pain for 3-4 weeks

EXAM:
SACRUM AND COCCYX - 2+ VIEW

[t sacrum ap]
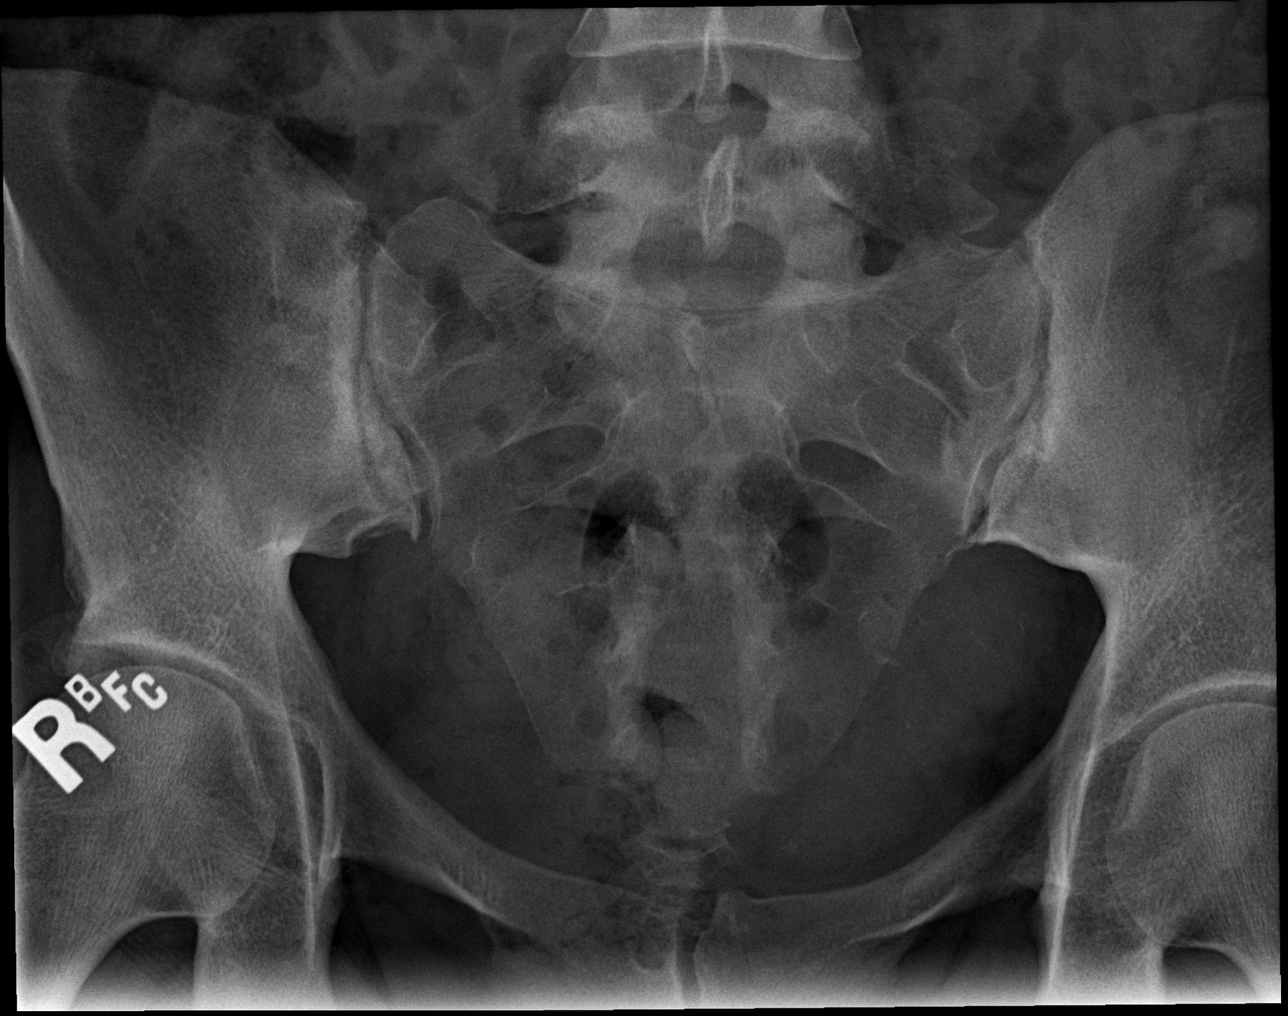

[t coccyx ap]
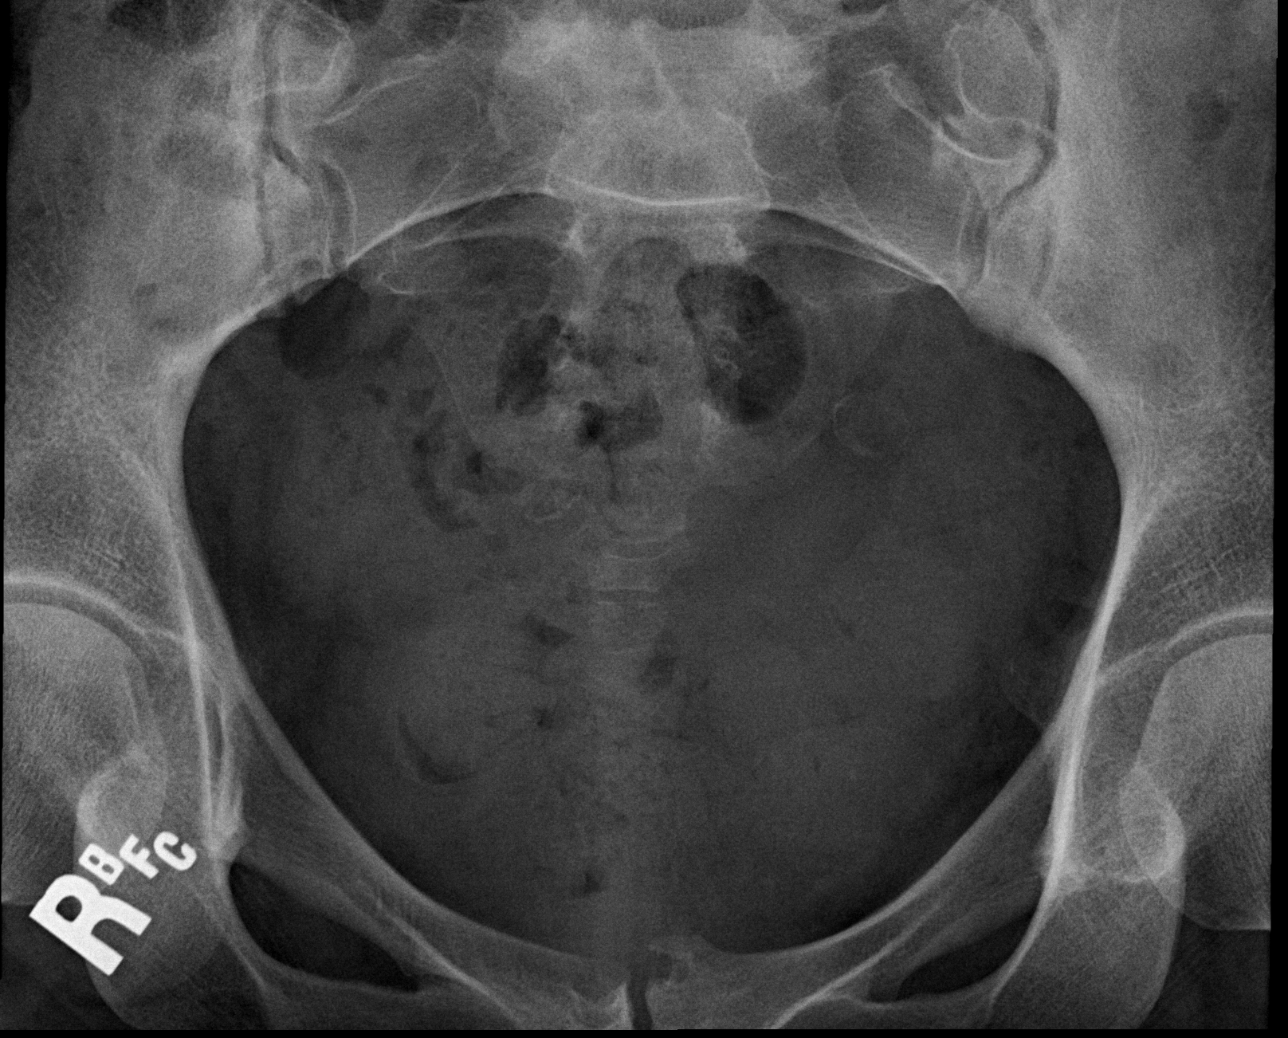

[t sacrum coccyx lat]
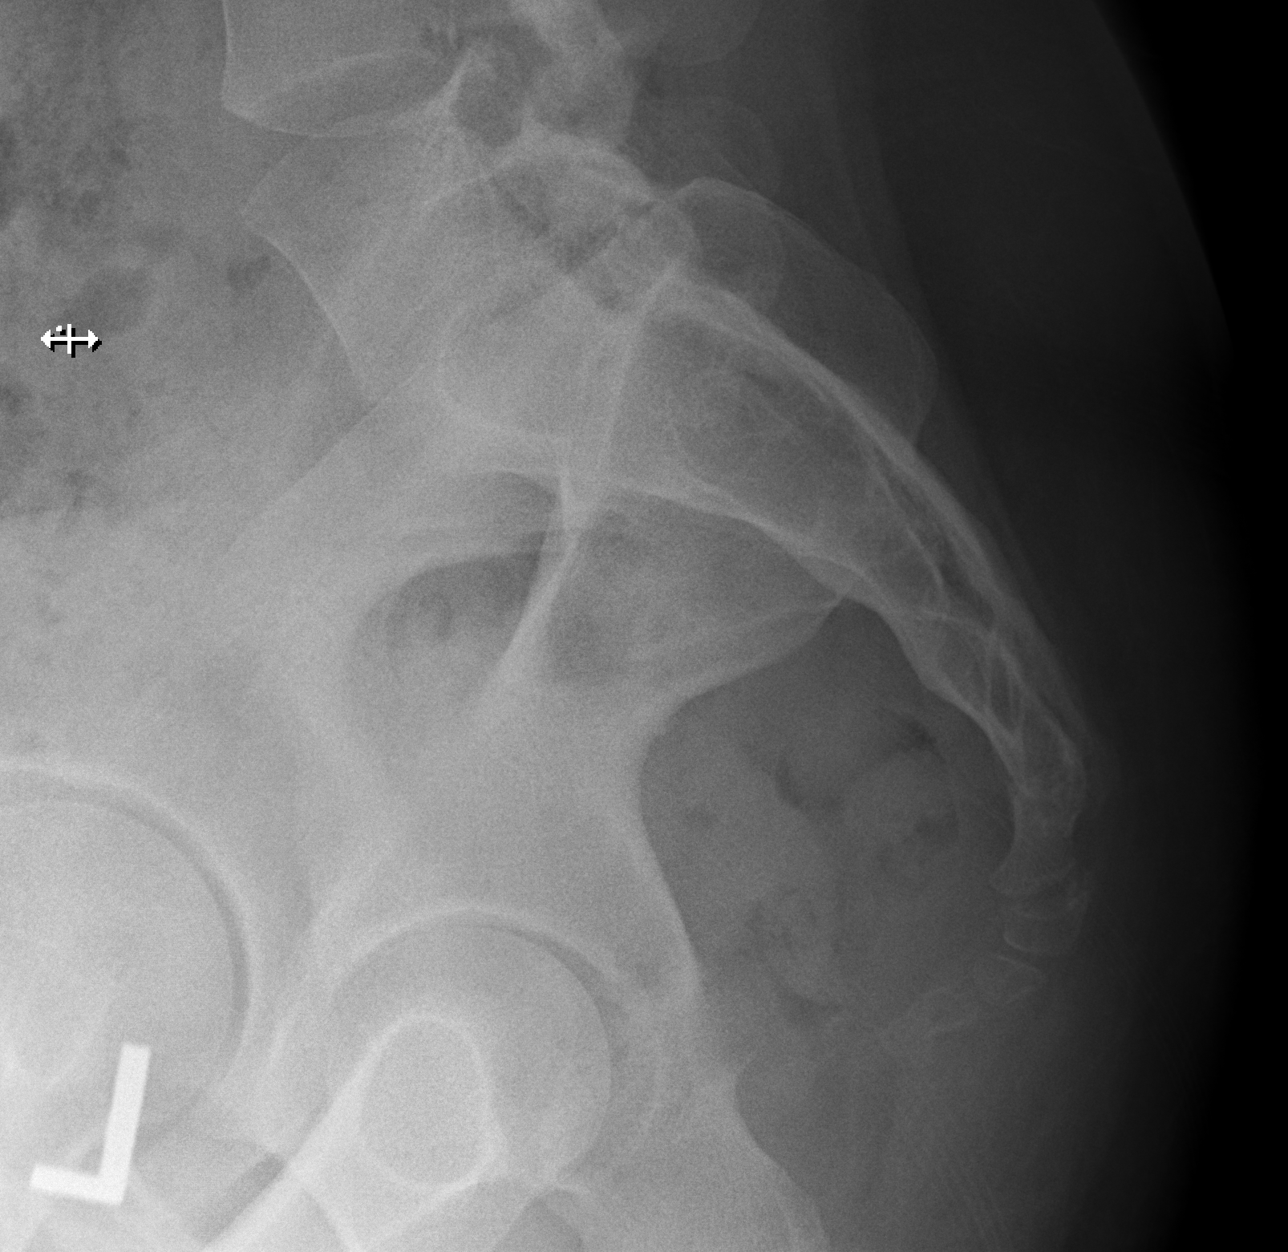

[3 of 3 positions shown; findings below may reference images not displayed]

FINDINGS: There is fracture dislocation at the sacrococcygeal junction likely
with a minimally displaced fracture of the lowest sacral segment and
some possible mild posterior translation of the coccyx. No other
acute fracture or traumatic malalignment is seen. Remaining bones of
the pelvis are intact and congruent. Partial lumbarization of the L5
left transverse process.
IMPRESSION: 1. Fracture dislocation at the sacrococcygeal junction with
minimally displaced fracture of the lowest sacral segment and mild
posterior translation of the coccyx.
2. Partial lumbarization of the L5 left transverse process.

## 2019-04-08 ENCOUNTER — Ambulatory Visit: Payer: Medicaid Other | Attending: Internal Medicine

## 2019-04-08 DIAGNOSIS — Z20822 Contact with and (suspected) exposure to covid-19: Secondary | ICD-10-CM

## 2019-04-09 LAB — NOVEL CORONAVIRUS, NAA: SARS-CoV-2, NAA: NOT DETECTED

## 2019-04-13 ENCOUNTER — Ambulatory Visit: Payer: Medicaid Other | Attending: Internal Medicine

## 2019-04-13 DIAGNOSIS — Z20822 Contact with and (suspected) exposure to covid-19: Secondary | ICD-10-CM

## 2019-04-15 LAB — NOVEL CORONAVIRUS, NAA: SARS-CoV-2, NAA: NOT DETECTED

## 2019-06-26 ENCOUNTER — Ambulatory Visit: Payer: Medicaid Other | Attending: Internal Medicine

## 2019-06-26 DIAGNOSIS — Z23 Encounter for immunization: Secondary | ICD-10-CM

## 2019-06-26 NOTE — Progress Notes (Signed)
   Covid-19 Vaccination Clinic  Name:  Nichole Green    MRN: [id number] DOB: 03-01-1981  06/26/2019  [name] was observed post Covid-19 immunization for 15 minutes without incident. She was provided with Vaccine Information Sheet and instruction to access the V-Safe system.   [name] was instructed to call 911 with any severe reactions post vaccine: Marland Kitchen Difficulty breathing  . Swelling of face and throat  . A fast heartbeat  . A bad rash all over body  . Dizziness and weakness   Immunizations Administered    Name Date Dose VIS Date Route   Pfizer COVID-19 Vaccine 06/26/2019  4:36 PM 0.3 mL 03/06/2019 Intramuscular   Manufacturer: [employer]   Lot: [id number]   [place]: [id number]

## 2019-07-20 ENCOUNTER — Encounter: Payer: Self-pay | Admitting: Advanced Practice Midwife

## 2019-07-20 ENCOUNTER — Other Ambulatory Visit: Payer: Self-pay

## 2019-07-20 ENCOUNTER — Ambulatory Visit: Payer: Medicaid Other | Admitting: Advanced Practice Midwife

## 2019-07-20 VITALS — BP 118/82 | HR 85 | Wt 157.2 lb

## 2019-07-20 DIAGNOSIS — Z3202 Encounter for pregnancy test, result negative: Secondary | ICD-10-CM

## 2019-07-20 DIAGNOSIS — Z3046 Encounter for surveillance of implantable subdermal contraceptive: Secondary | ICD-10-CM

## 2019-07-20 DIAGNOSIS — Z30017 Encounter for initial prescription of implantable subdermal contraceptive: Secondary | ICD-10-CM

## 2019-07-20 MED ORDER — ETONOGESTREL 68 MG ~~LOC~~ IMPL
68.0000 mg | DRUG_IMPLANT | Freq: Once | SUBCUTANEOUS | Status: AC
  Administered 2019-07-20: 68 mg via SUBCUTANEOUS

## 2019-07-20 NOTE — Progress Notes (Signed)
GYNECOLOGY CLINIC PROCEDURE NOTE  Nichole Green is a 39 y.o. [id number] here for Nexplanon removal and Nexplanon insertion.  Last pap smear was on 06/11/18 and was normal.  No other gynecologic concerns.   Nexplanon Removal and Insertion  Patient identified, informed consent performed, consent signed.   Patient does understand that irregular bleeding is a very common side effect of this medication. She was advised to have backup contraception for one week after replacement of the implant. Pregnancy test in clinic today was negative.  Appropriate time out taken. Implanon site identified. Area prepped in usual sterile fashon. One ml of 1% lidocaine was used to anesthetize the area at the distal end of the implant. A small stab incision was made right beside the implant on the distal portion. The Nexplanon rod was grasped using hemostats and removed without difficulty. There was minimal blood loss. There were no complications. Area was then injected with 3 ml of 1 % lidocaine. She was re-prepped with betadine, Nexplanon removed from packaging, Device confirmed in needle, then inserted full length of needle and withdrawn per handbook instructions. Nexplanon was able to palpated in the patient's arm; patient palpated the insert herself.  There was minimal blood loss. Patient insertion site covered with guaze and a pressure bandage to reduce any bruising. The patient tolerated the procedure well and was given post procedure instructions.  She was advised to have backup contraception for one week.    Fatima Blank, CNM 11:52 AM

## 2019-07-20 NOTE — Progress Notes (Signed)
Patient is in the office for nexplanon removal and re-insertion

## 2019-07-21 ENCOUNTER — Ambulatory Visit: Payer: Medicaid Other | Attending: Internal Medicine

## 2019-07-21 DIAGNOSIS — Z23 Encounter for immunization: Secondary | ICD-10-CM

## 2019-07-21 NOTE — Progress Notes (Signed)
° °  Covid-19 Vaccination Clinic  Name:  Nichole Green    MRN: [id number] DOB: 27-Dec-1980  07/21/2019  [name] was observed post Covid-19 immunization for 15 minutes without incident. She was provided with Vaccine Information Sheet and instruction to access the V-Safe system.   [name] was instructed to call 911 with any severe reactions post vaccine:  Difficulty breathing   Swelling of face and throat   A fast heartbeat   A bad rash all over body   Dizziness and weakness   Immunizations Administered    Name Date Dose VIS Date Route   Pfizer COVID-19 Vaccine 07/21/2019  4:13 PM 0.3 mL 05/20/2018 Intramuscular   Manufacturer: [place]   Lot: [id number]   [place]: [id number]

## 2020-01-16 ENCOUNTER — Emergency Department (HOSPITAL_COMMUNITY): Payer: Medicaid Other

## 2020-01-16 ENCOUNTER — Observation Stay (HOSPITAL_COMMUNITY)
Admission: EM | Admit: 2020-01-16 | Discharge: 2020-01-17 | Disposition: A | Discharge status: Home / Self Care | Payer: Medicaid Other | Attending: Internal Medicine | Admitting: Internal Medicine

## 2020-01-16 ENCOUNTER — Encounter (HOSPITAL_COMMUNITY): Payer: Self-pay

## 2020-01-16 DIAGNOSIS — W109XXA Fall (on) (from) unspecified stairs and steps, initial encounter: Secondary | ICD-10-CM | POA: Insufficient documentation

## 2020-01-16 DIAGNOSIS — I10 Essential (primary) hypertension: Secondary | ICD-10-CM | POA: Insufficient documentation

## 2020-01-16 DIAGNOSIS — W19XXXA Unspecified fall, initial encounter: Secondary | ICD-10-CM

## 2020-01-16 DIAGNOSIS — Z20822 Contact with and (suspected) exposure to covid-19: Secondary | ICD-10-CM | POA: Diagnosis not present

## 2020-01-16 DIAGNOSIS — T148XXA Other injury of unspecified body region, initial encounter: Secondary | ICD-10-CM

## 2020-01-16 DIAGNOSIS — R4182 Altered mental status, unspecified: Secondary | ICD-10-CM | POA: Diagnosis not present

## 2020-01-16 DIAGNOSIS — M79605 Pain in left leg: Secondary | ICD-10-CM | POA: Diagnosis present

## 2020-01-16 LAB — COMPREHENSIVE METABOLIC PANEL
ALT: 13 U/L (ref 0–44)
AST: 18 U/L (ref 15–41)
Albumin: 4.3 g/dL (ref 3.5–5.0)
Alkaline Phosphatase: 46 U/L (ref 38–126)
Anion gap: 13 (ref 5–15)
BUN: 17 mg/dL (ref 6–20)
CO2: 19 mmol/L — ABNORMAL LOW (ref 22–32)
Calcium: 9.8 mg/dL (ref 8.9–10.3)
Chloride: 106 mmol/L (ref 98–111)
Creatinine, Ser: 0.66 mg/dL (ref 0.44–1.00)
GFR, Estimated: >60 mL/min (ref >60)
Glucose, Bld: 87 mg/dL (ref 70–99)
Potassium: 3.9 mmol/L (ref 3.5–5.1)
Sodium: 138 mmol/L (ref 135–145)
Total Bilirubin: 0.8 mg/dL (ref 0.3–1.2)
Total Protein: 7.6 g/dL (ref 6.5–8.1)

## 2020-01-16 LAB — CBC WITH DIFFERENTIAL/PLATELET
Abs Immature Granulocytes: 0.03 10*3/uL (ref 0.00–0.07)
Basophils Absolute: 0 10*3/uL (ref 0.0–0.1)
Basophils Relative: 0 %
Eosinophils Absolute: 0.1 10*3/uL (ref 0.0–0.5)
Eosinophils Relative: 1 %
HCT: 47.4 % — ABNORMAL HIGH (ref 36.0–46.0)
Hemoglobin: 15.2 g/dL — ABNORMAL HIGH (ref 12.0–15.0)
Immature Granulocytes: 0 %
Lymphocytes Relative: 31 %
Lymphs Abs: 3.1 10*3/uL (ref 0.7–4.0)
MCH: 26.3 pg (ref 26.0–34.0)
MCHC: 32.1 g/dL (ref 30.0–36.0)
MCV: 82.1 fL (ref 80.0–100.0)
Monocytes Absolute: 0.6 10*3/uL (ref 0.1–1.0)
Monocytes Relative: 6 %
Neutro Abs: 6.3 10*3/uL (ref 1.7–7.7)
Neutrophils Relative %: 62 %
Platelets: 247 10*3/uL (ref 150–400)
RBC: 5.77 MIL/uL — ABNORMAL HIGH (ref 3.87–5.11)
RDW: 13.2 % (ref 11.5–15.5)
WBC: 10 10*3/uL (ref 4.0–10.5)
nRBC: 0 % (ref 0.0–0.2)

## 2020-01-16 LAB — RESPIRATORY PANEL BY RT PCR (FLU A&B, COVID)
Influenza A by PCR: NEGATIVE
Influenza B by PCR: NEGATIVE
SARS Coronavirus 2 by RT PCR: NEGATIVE

## 2020-01-16 LAB — I-STAT CHEM 8, ED
BUN: 19 mg/dL (ref 6–20)
Calcium, Ion: 1.16 mmol/L (ref 1.15–1.40)
Chloride: 106 mmol/L (ref 98–111)
Creatinine, Ser: 0.6 mg/dL (ref 0.44–1.00)
Glucose, Bld: 91 mg/dL (ref 70–99)
HCT: 47 % — ABNORMAL HIGH (ref 36.0–46.0)
Hemoglobin: 16 g/dL — ABNORMAL HIGH (ref 12.0–15.0)
Potassium: 3.6 mmol/L (ref 3.5–5.1)
Sodium: 140 mmol/L (ref 135–145)
TCO2: 19 mmol/L — ABNORMAL LOW (ref 22–32)

## 2020-01-16 LAB — LIPASE, BLOOD: Lipase: 37 U/L (ref 11–51)

## 2020-01-16 IMAGING — DX DG PELVIS 1-2V
1 series · 1 of 1 positions shown · non-contrast
Comparison: None.

CLINICAL DATA: Recent fall with pelvic pain, initial encounter

EXAM:
PELVIS - 1 VIEW

[hip ap]
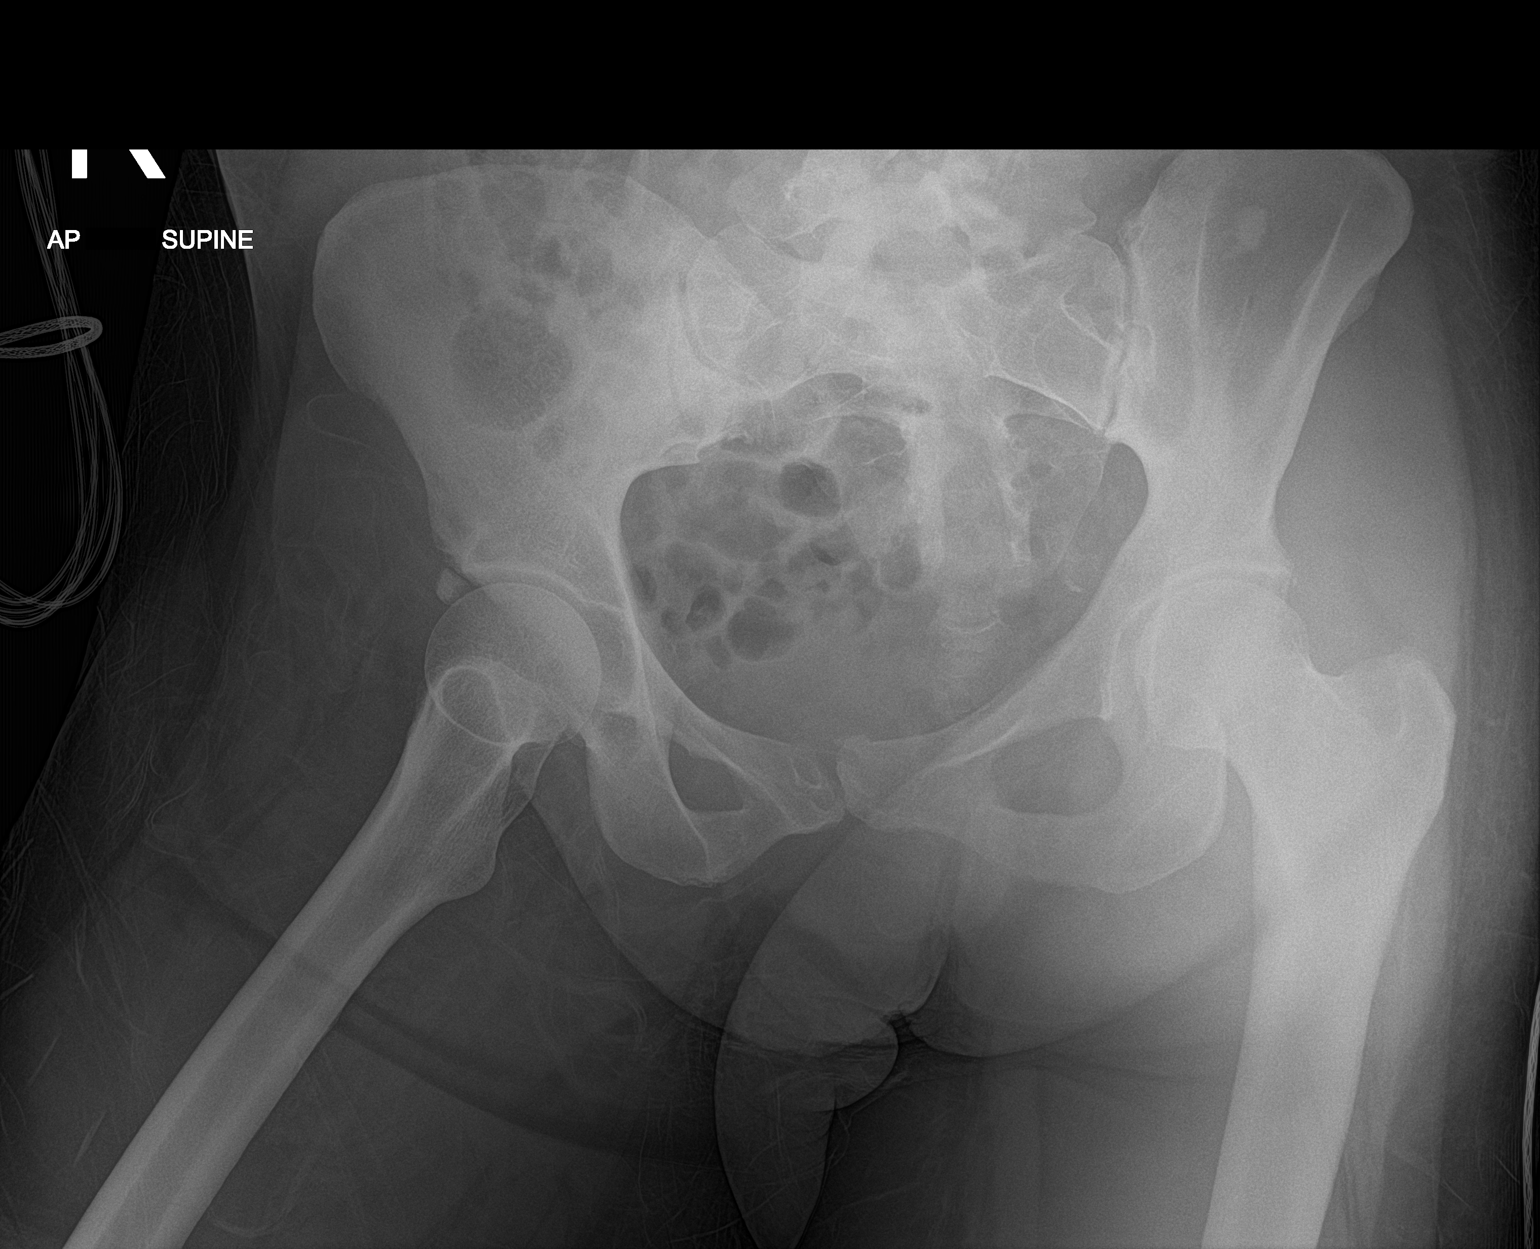

[1 of 1 positions shown; findings below may reference images not displayed]

FINDINGS: Pelvic ring is intact. No acute fracture or dislocation is noted.
Mild degenerative changes of the hip joints are seen. No soft tissue
abnormality is noted.
IMPRESSION: Degenerative change without acute abnormality.

## 2020-01-16 IMAGING — DX DG CHEST 1V PORT
1 series · 1 of 1 positions shown · non-contrast
Comparison: [DATE]

CLINICAL DATA: Fall with chest pain, initial encounter

EXAM:
PORTABLE CHEST 1 VIEW

[chest ap]
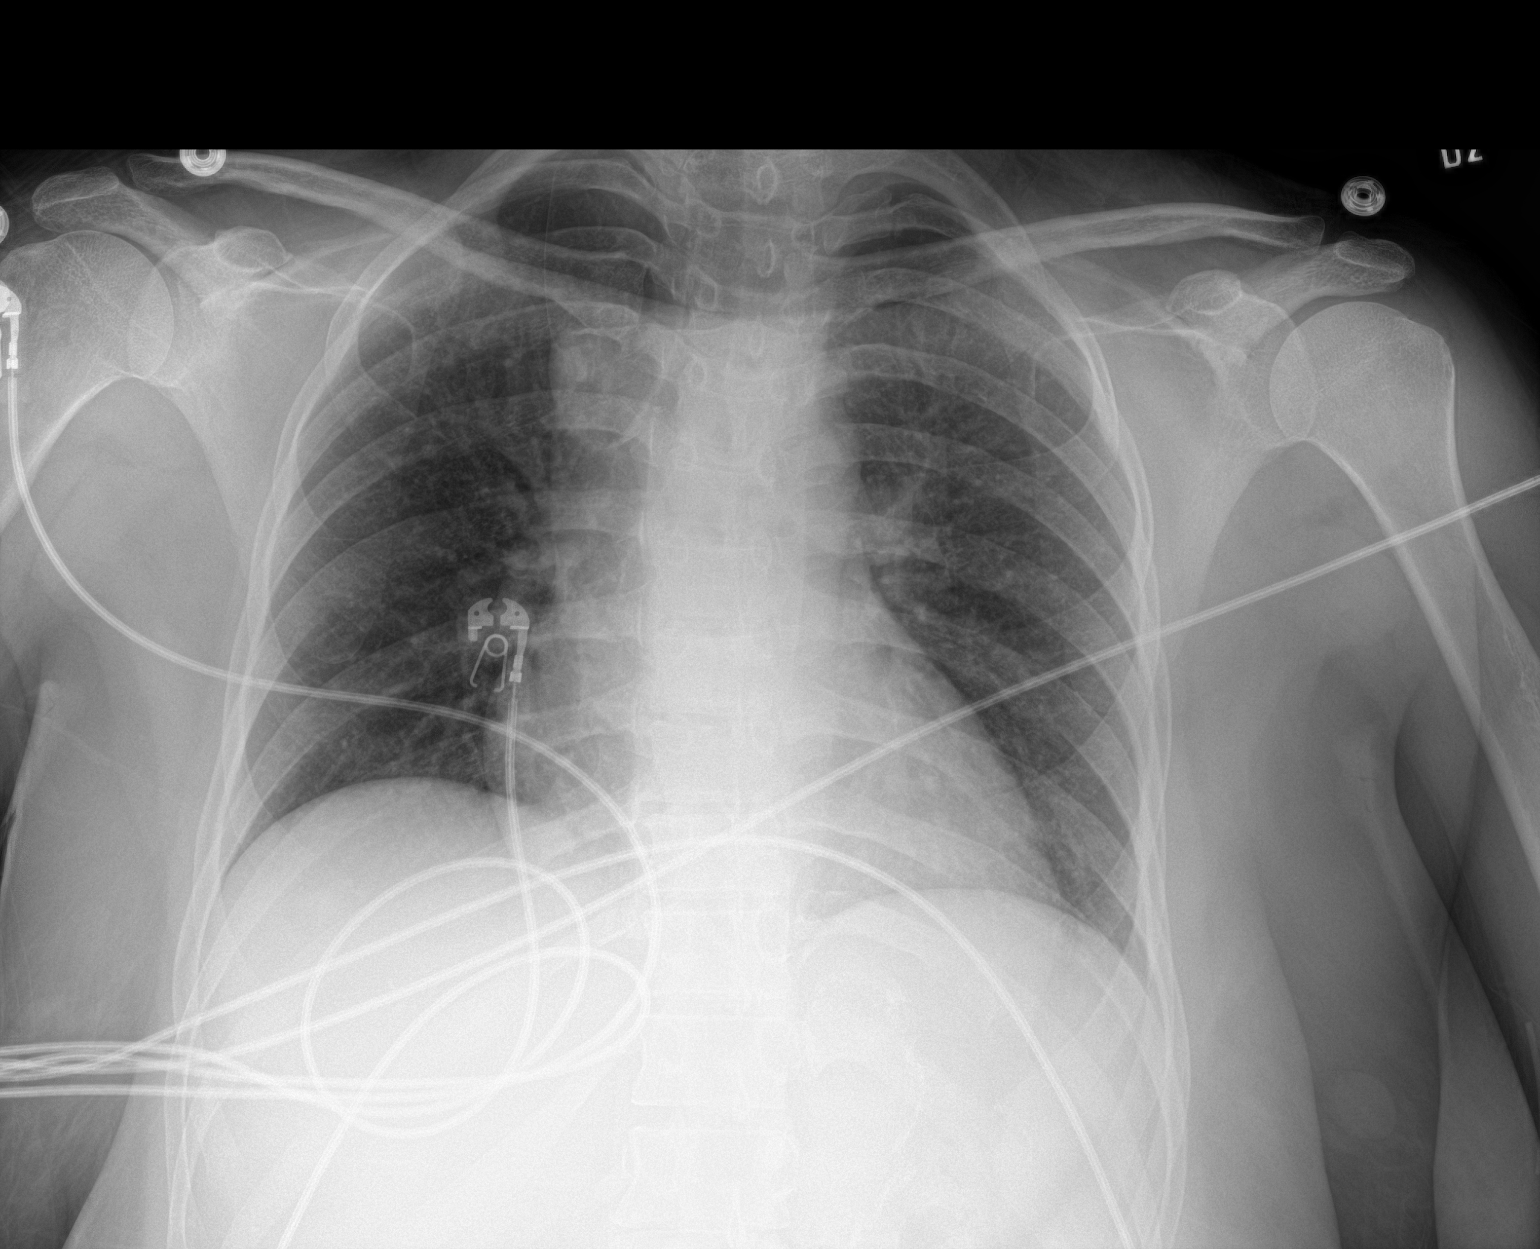

[1 of 1 positions shown; findings below may reference images not displayed]

FINDINGS: Cardiac shadow is within normal limits. Lungs are well aerated
bilaterally. No focal infiltrate or sizable effusion is seen. No
bony abnormality is noted.
IMPRESSION: No acute abnormality noted.

## 2020-01-16 IMAGING — CT CT ABD-PELV W/ CM
3 of 5 series · 14 of 36 positions shown, 17 images · IV contrast (APPLIED)
Comparison: Plain films from earlier in the same day.

CLINICAL DATA: Fall downstairs with left hip pain, initial
encounter

EXAM:
CT CHEST, ABDOMEN, AND PELVIS WITH CONTRAST
TECHNIQUE: Multidetector CT imaging of the chest, abdomen and pelvis was
performed following the standard protocol during bolus
administration of intravenous contrast.
CONTRAST:  100mL OMNIPAQUE IOHEXOL 300 MG/ML  SOLN

[Series 3: cap 5.0 i31f 2 · axial · 0.87mm/px · z∈[-711,-151]mm · 9 of 140 slices shown, 12 images]
[im 14/140  mediastinal]
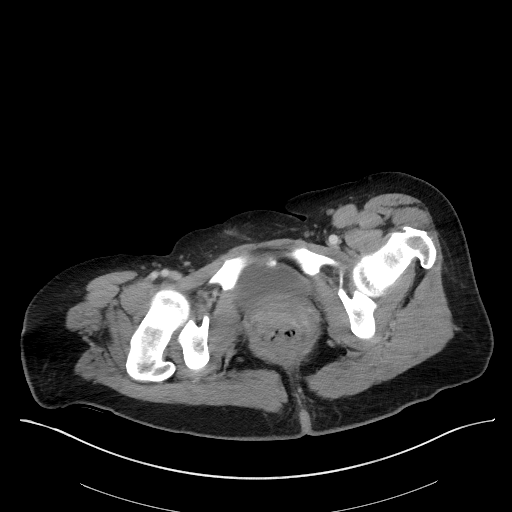
[im 14/140  lung]
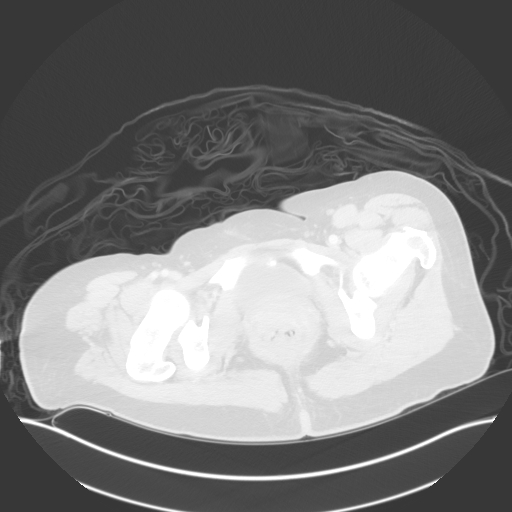
[im 28/140  lung]
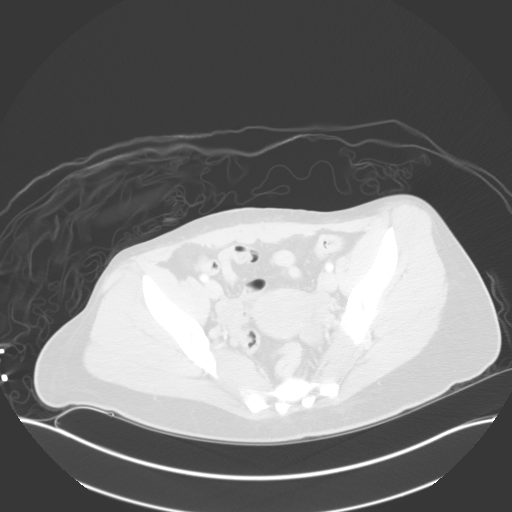
[im 42/140  lung]
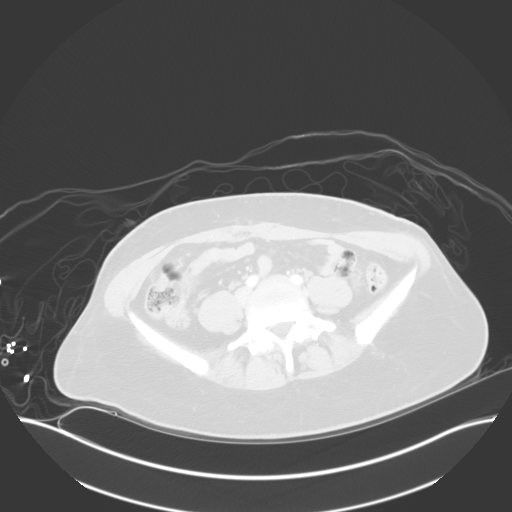
[im 56/140  lung]
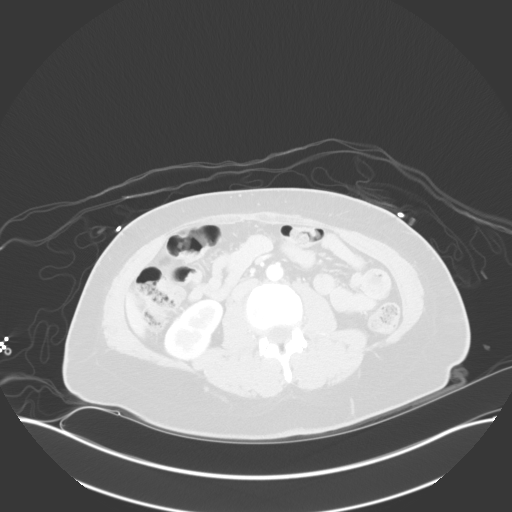
[im 70/140  mediastinal]
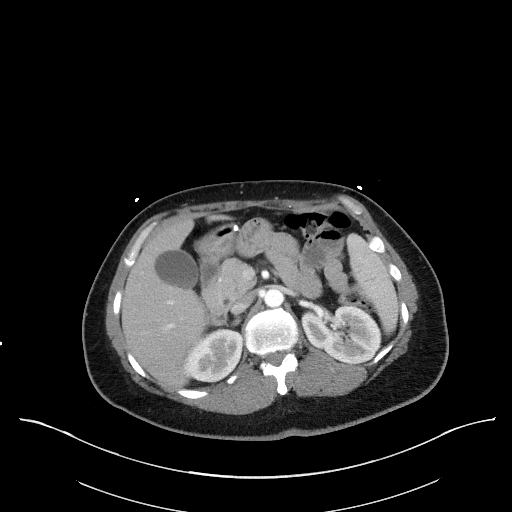
[im 70/140  lung]
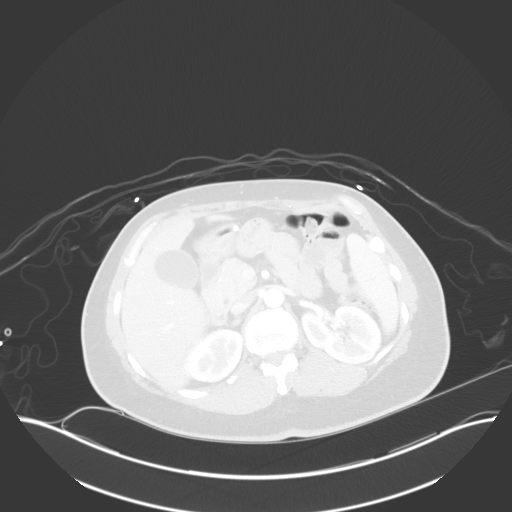
[im 84/140  lung]
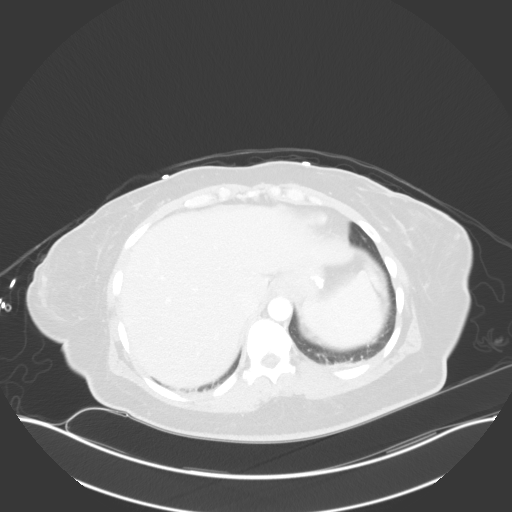
[im 98/140  lung]
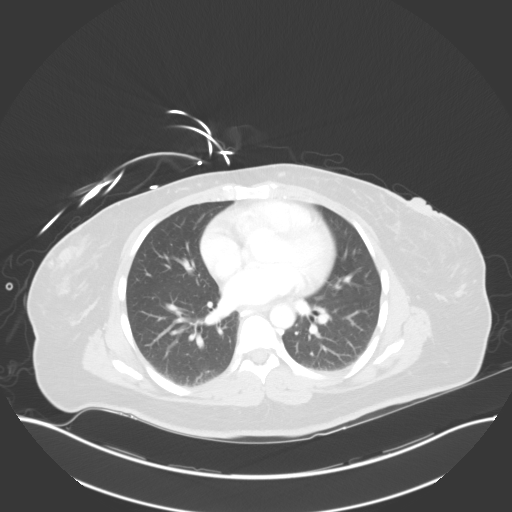
[im 112/140  lung]
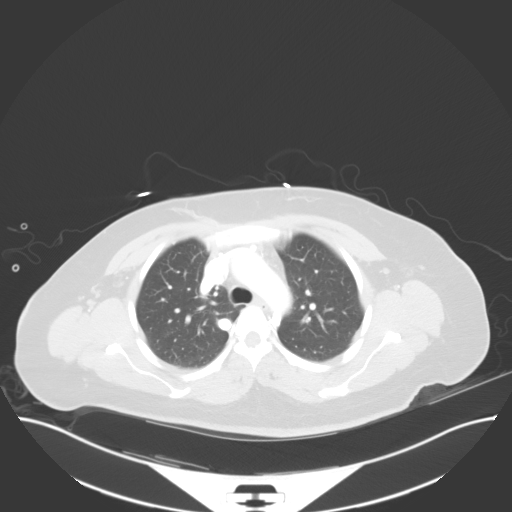
[im 126/140  mediastinal]
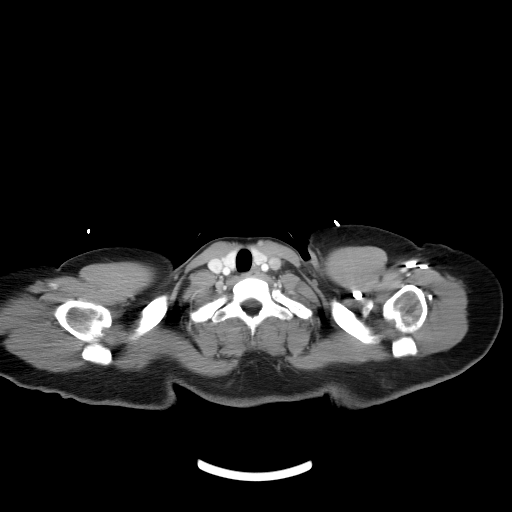
[im 126/140  lung]
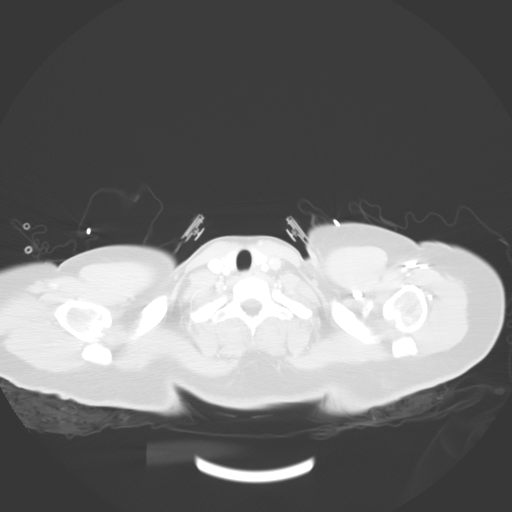

[Series 4: lungs · axial · 0.87mm/px · z∈[-393,-341]mm · 2 of 169 slices shown]
[im 13/169  lung]
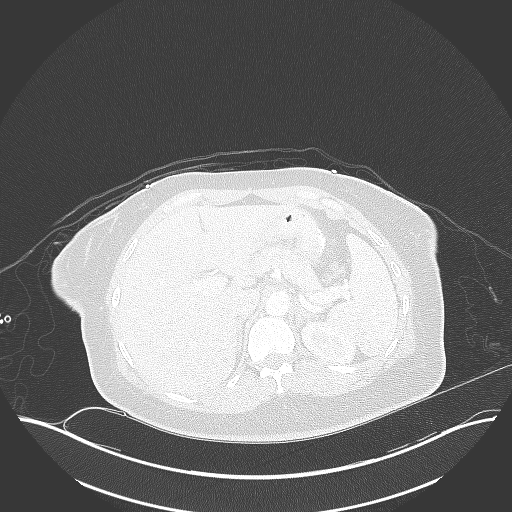
[im 39/169  lung]
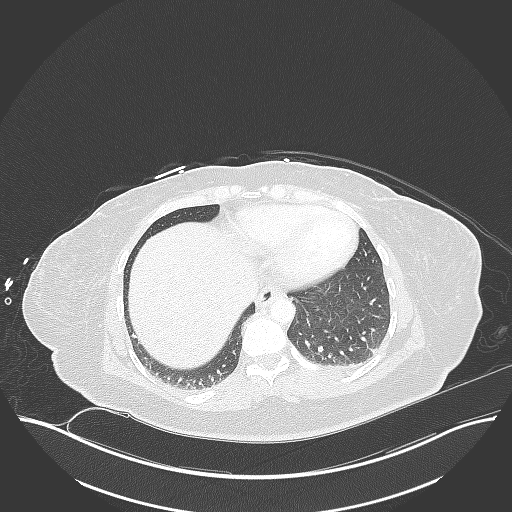

[Series 6: coronal · coronal · 0.84mm/px · 3 of 151 slices shown]
[im 31/151  lung]
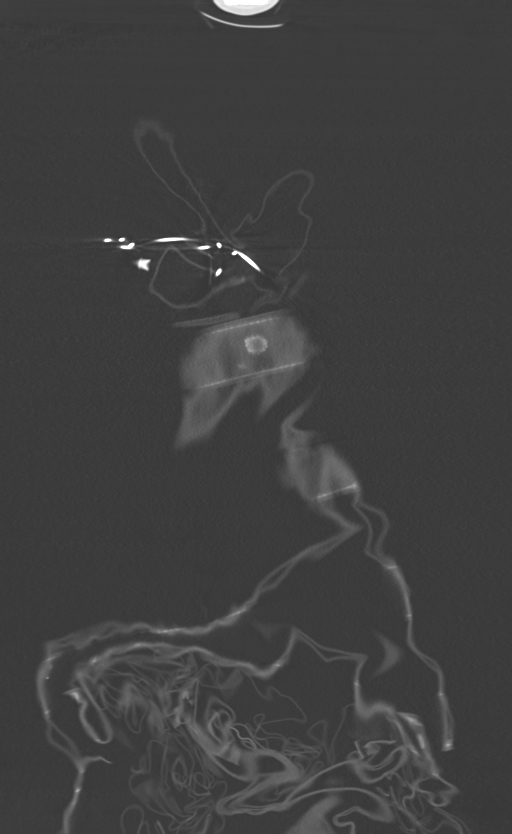
[im 61/151  lung]
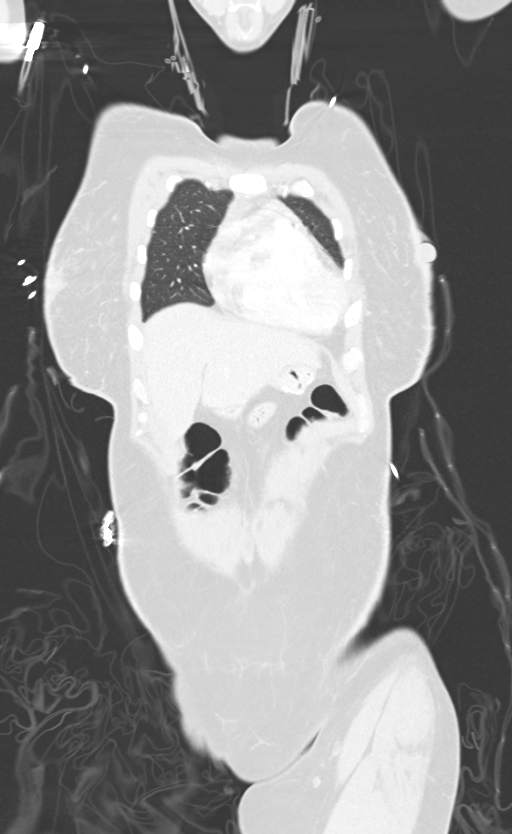
[im 91/151  lung]
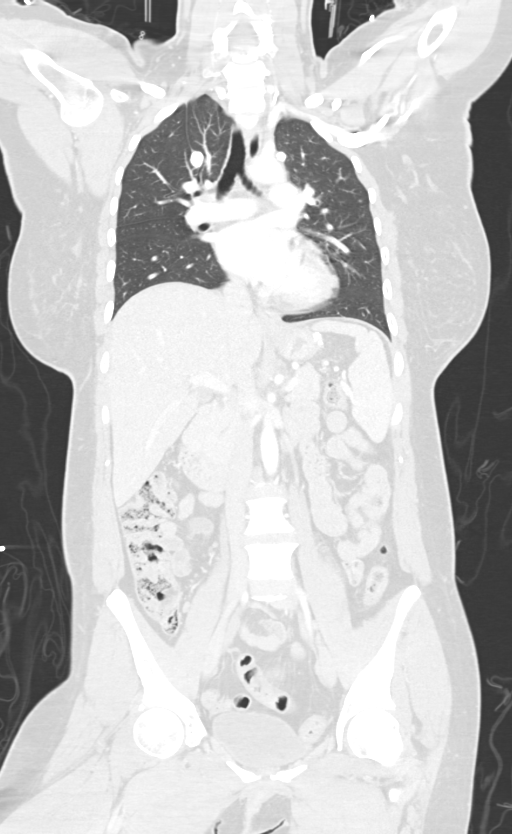

[14 of 36 positions shown; findings below may reference images not displayed]

FINDINGS: CT CHEST FINDINGS

Cardiovascular: Thoracic aorta and its branches are within normal
limits. No cardiac enlargement is noted. The pulmonary artery as
visualized is within normal limits. No coronary calcifications are
seen.

Mediastinum/Nodes: Thoracic inlet is within normal limits. No
sizable hilar or mediastinal adenopathy is noted. No mediastinal
hematoma is seen. The esophagus is within normal limits.

Lungs/Pleura: Lungs are well aerated bilaterally. No focal
infiltrate, effusion or pneumothorax is noted.

Musculoskeletal: No acute bony abnormality is seen.

CT ABDOMEN PELVIS FINDINGS

Hepatobiliary: Gallbladder is within normal limits. Fatty liver is
noted.

Pancreas: Unremarkable. No pancreatic ductal dilatation or
surrounding inflammatory changes.

Spleen: Normal in size without focal abnormality.

Adrenals/Urinary Tract: Adrenal glands are within normal limits.
Kidneys are well visualized bilaterally. Bladder is within normal
limits. No obstructive changes are seen.

Stomach/Bowel: No obstructive or inflammatory changes of the colon
are noted. Changes of prior gastric bypass are noted. No small bowel
abnormality is noted.

Vascular/Lymphatic: No significant vascular findings are present. No
enlarged abdominal or pelvic lymph nodes.

Reproductive: Uterus is within normal limits. Few small cysts are
noted within the left ovary. No adnexal mass is noted.

Other: No abdominal wall hernia or abnormality. No abdominopelvic
ascites.

Musculoskeletal: No acute or significant osseous findings.
IMPRESSION: CT of the chest: No acute abnormality noted.

CT of the abdomen and pelvis: Fatty liver.

No acute posttraumatic abnormality is noted.

## 2020-01-16 IMAGING — MR MR HIP*L* W/O CM
6 series · 39 of 40 positions shown · non-contrast
Comparison: None.

CLINICAL DATA: Fall, pain

EXAM:
MR OF THE LEFT HIP and femur WITHOUT CONTRAST
TECHNIQUE: Multiplanar, multisequence MR imaging was performed. No intravenous
contrast was administered.

[Series 8: T1 · coronal · left · 4.0mm · 0.94mm/px · 6 of 40 slices shown]
[im 1/40]
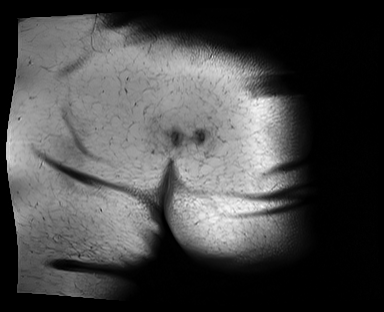
[im 7/40]
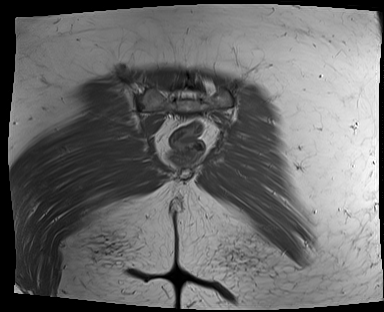
[im 14/40]
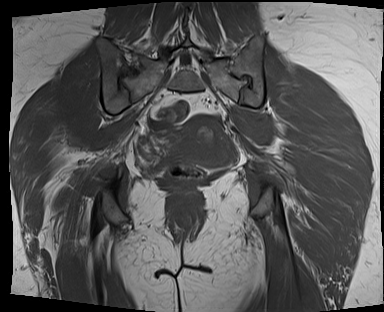
[im 20/40]
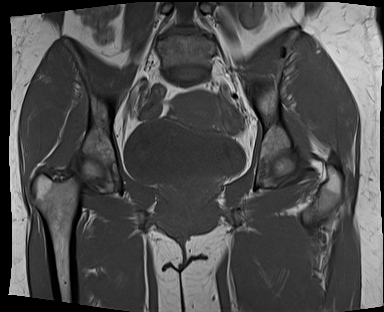
[im 27/40]
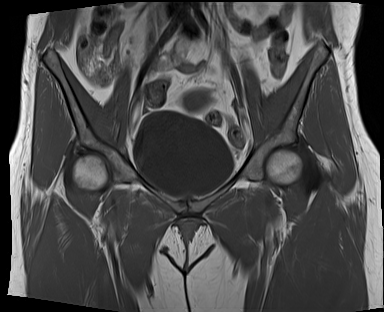
[im 33/40]
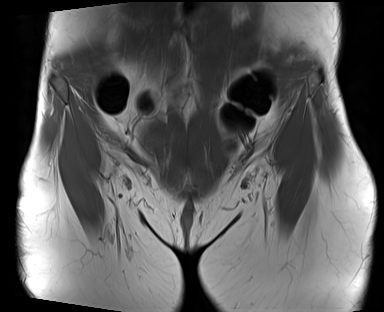

[Series 9: T2 fat-sat · coronal · left · 4.0mm · 0.80mm/px · 8 of 40 slices shown (1 of 2)]
[im 1/40]
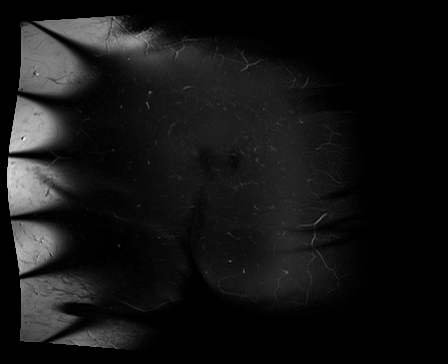
[im 6/40]
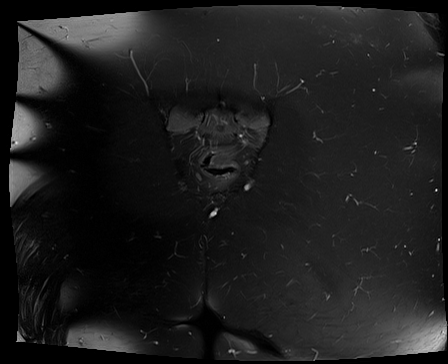
[im 12/40]
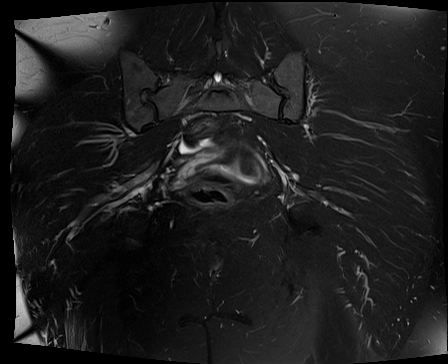
[im 17/40]
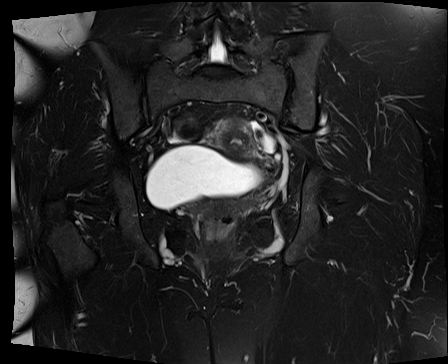
[im 23/40]
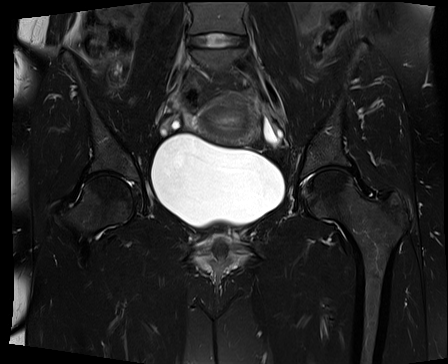
[im 28/40]
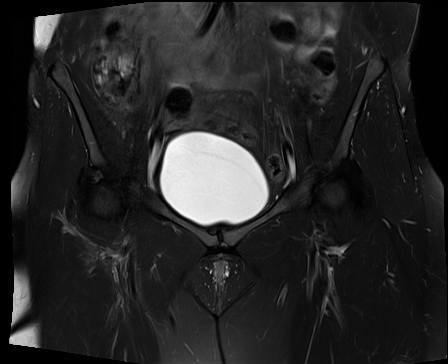
[im 34/40]
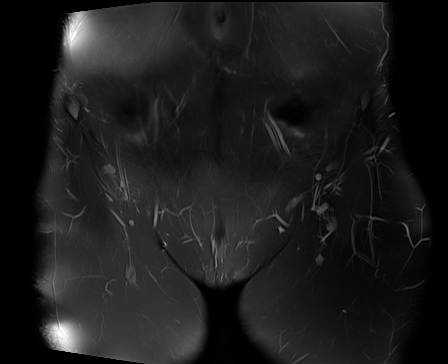
[im 40/40]
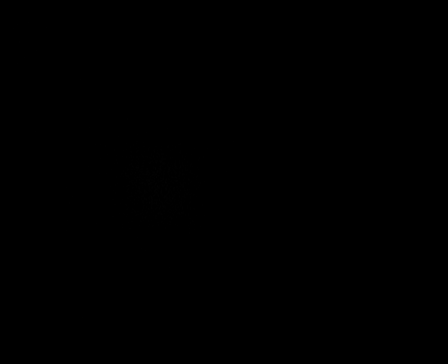

[Series 10: T2 fat-sat · axial · left · 4.0mm · 0.70mm/px · z∈[-62,+108]mm · 7 of 35 slices shown (2 of 2)]
[im 1/35]
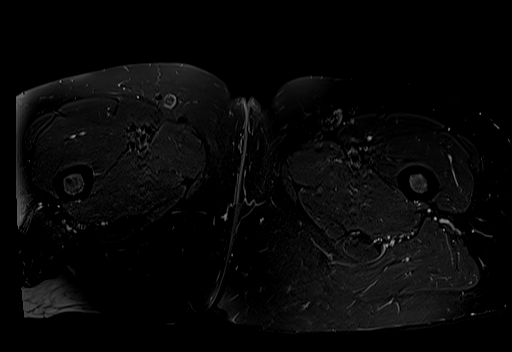
[im 6/35]
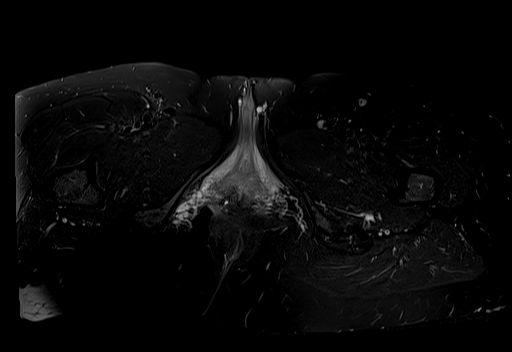
[im 12/35]
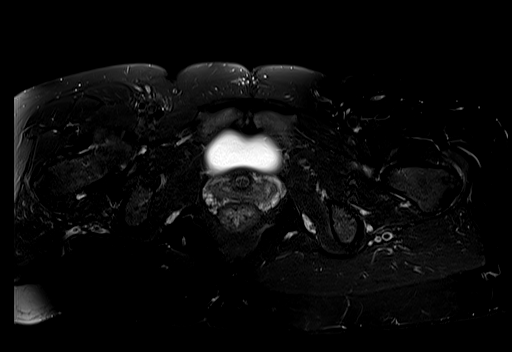
[im 18/35]
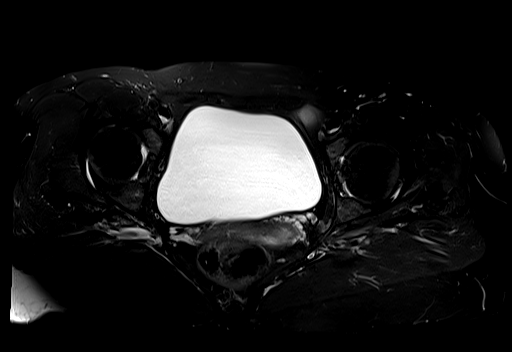
[im 23/35]
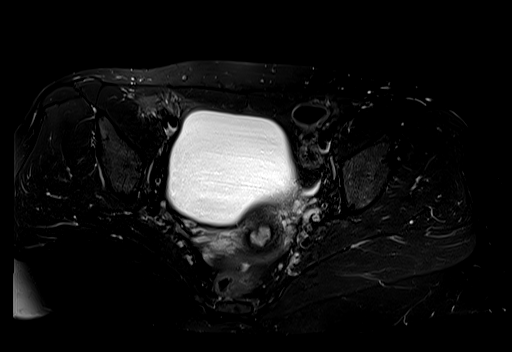
[im 29/35]
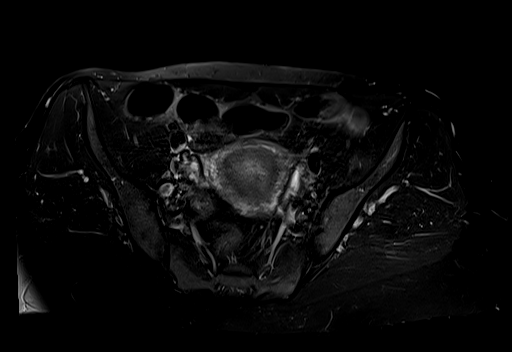
[im 35/35]
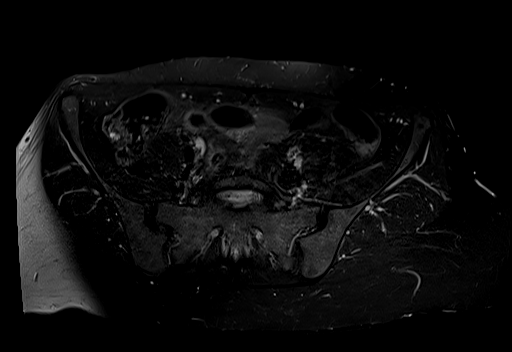

[Series 11: PD fat-sat · sagittal · left · 4.0mm · 0.56mm/px · 6 of 28 slices shown (1 of 3)]
[im 1/28]
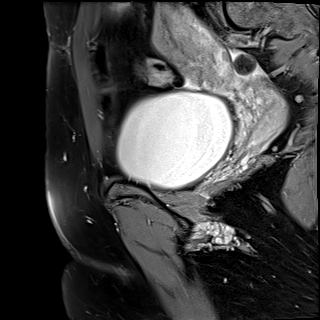
[im 6/28]
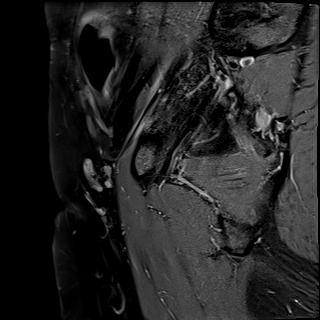
[im 11/28]
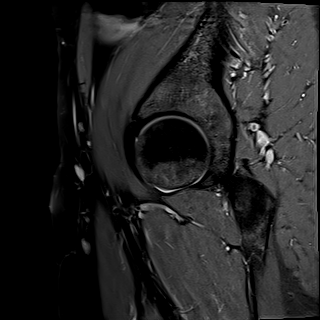
[im 17/28]
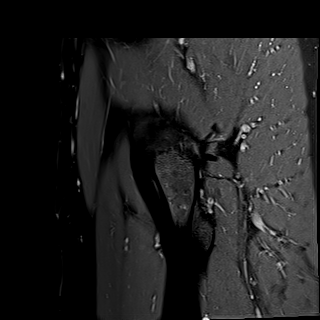
[im 22/28]
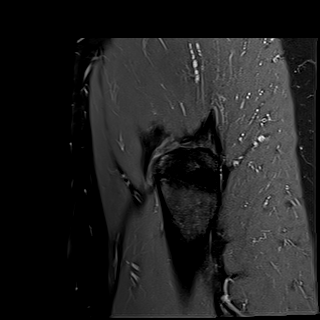
[im 28/28]
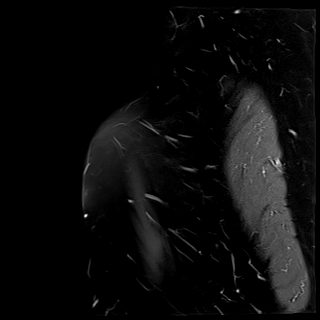

[Series 12: PD fat-sat · coronal · left · 3.0mm · 0.70mm/px · 5 of 23 slices shown (2 of 3)]
[im 1/23]
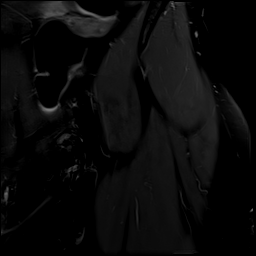
[im 6/23]
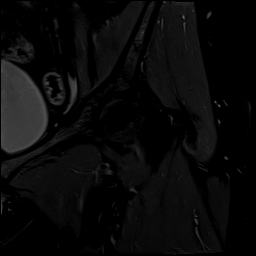
[im 12/23]
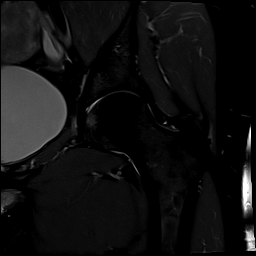
[im 17/23]
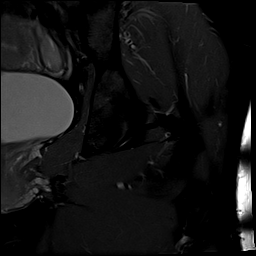
[im 23/23]
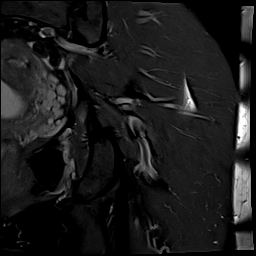

[Series 13: PD fat-sat · axial · left · 4.0mm · 0.62mm/px · z∈[-69,+101]mm · 7 of 35 slices shown (3 of 3)]
[im 1/35]
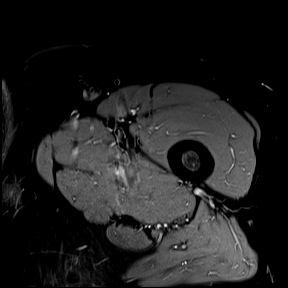
[im 6/35]
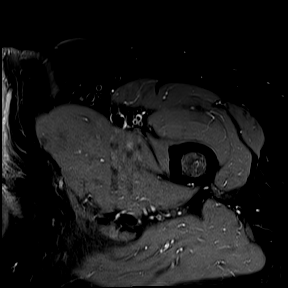
[im 12/35]
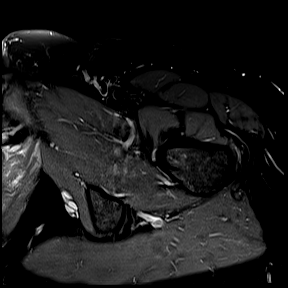
[im 18/35]
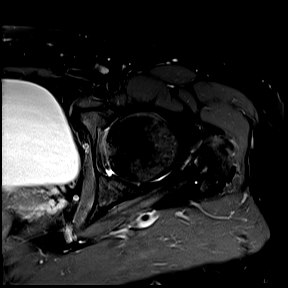
[im 23/35]
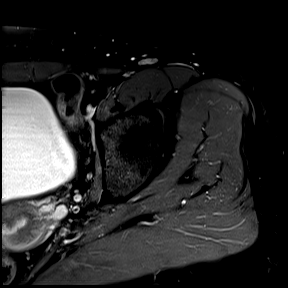
[im 29/35]
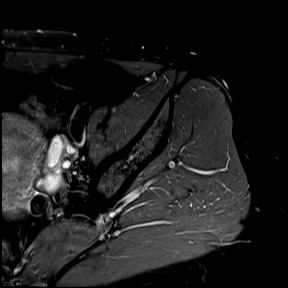
[im 35/35]
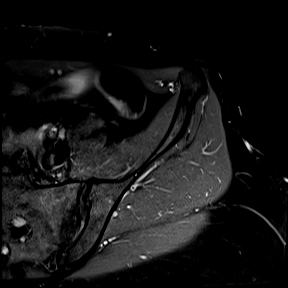

[39 of 40 positions shown; findings below may reference images not displayed]

FINDINGS: Bones/Joint/Cartilage

Normal osseous marrow signal seen throughout. No osseous fracture or
marrow edema. No areas of cortical destruction or periosteal
reaction. The articular surfaces are well maintained.

Ligaments

The ligamentum teres is intact.

Muscles and Tendons
Mildly increased signal seen within the obturator internus
musculature. The remainder of the muscles surrounding the hip and
femur are normal appearance without focal atrophy or edema.
Minimally increased signal seen at the hamstrings and gluteal tendon
insertion site. The iliopsoas tendon is intact.

Soft tissue
No focal soft tissue swelling or soft tissue mass is seen. The
visualized portion of the deep pelvis is grossly unremarkable. A
small amount of physiologic free fluid seen within the deep
cul-de-sac.
IMPRESSION: Mildly increased signal within the internal obturator musculature,
likely mild muscular strain. No osseous fracture or marrow edema.

Mild insertional hamstrings and gluteal tendinosis

## 2020-01-16 IMAGING — CR DG KNEE COMPLETE 4+V*L*
4 series · 4 of 4 positions shown · non-contrast
Comparison: None.

CLINICAL DATA: Fall

EXAM:
LEFT KNEE - COMPLETE 4+ VIEW

[knee lat]
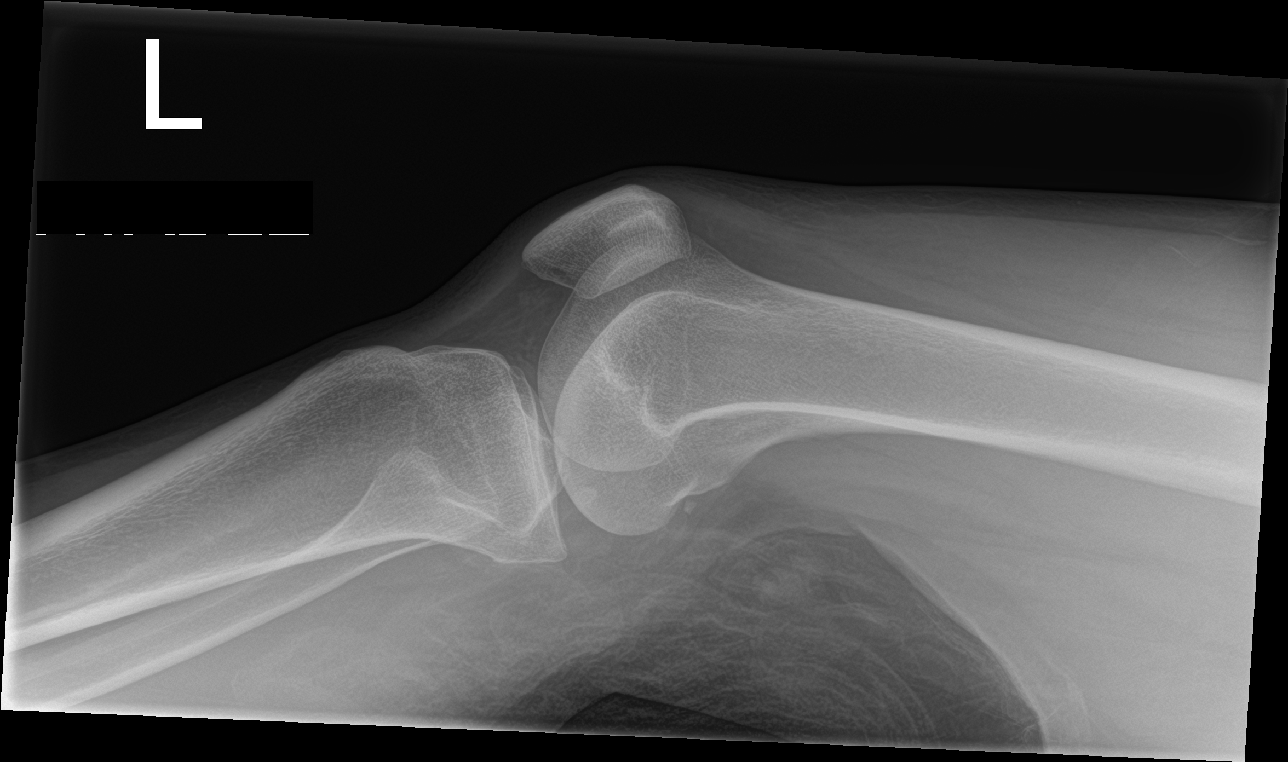

[knee obl (1 of 2)]
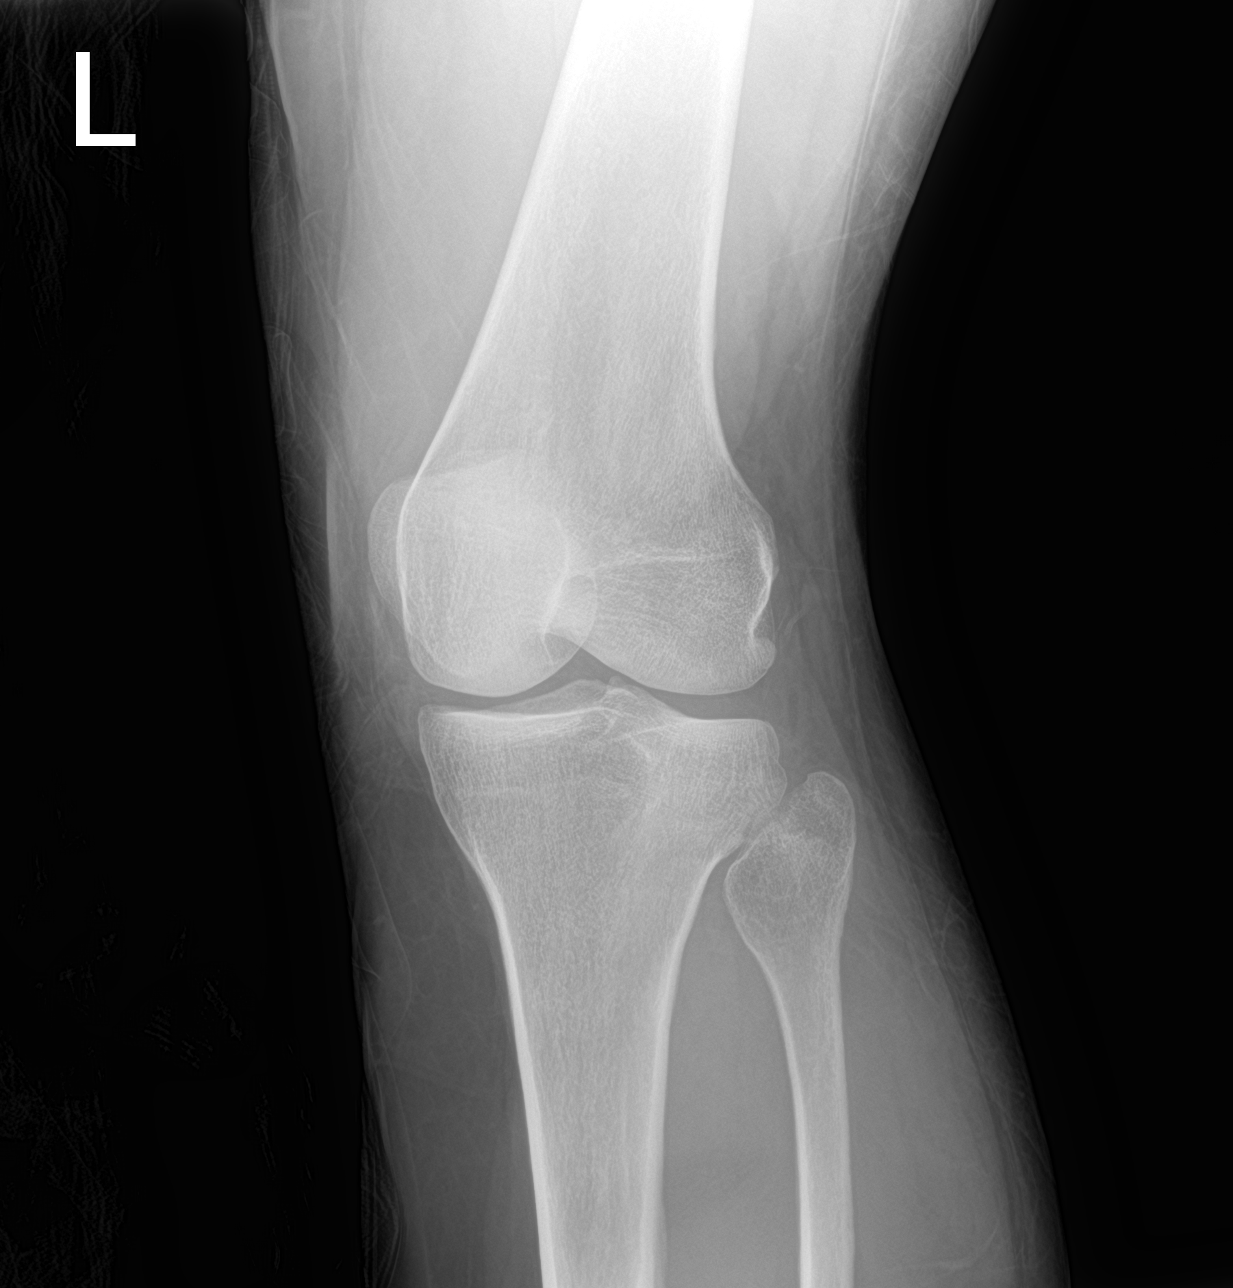

[knee obl (2 of 2)]
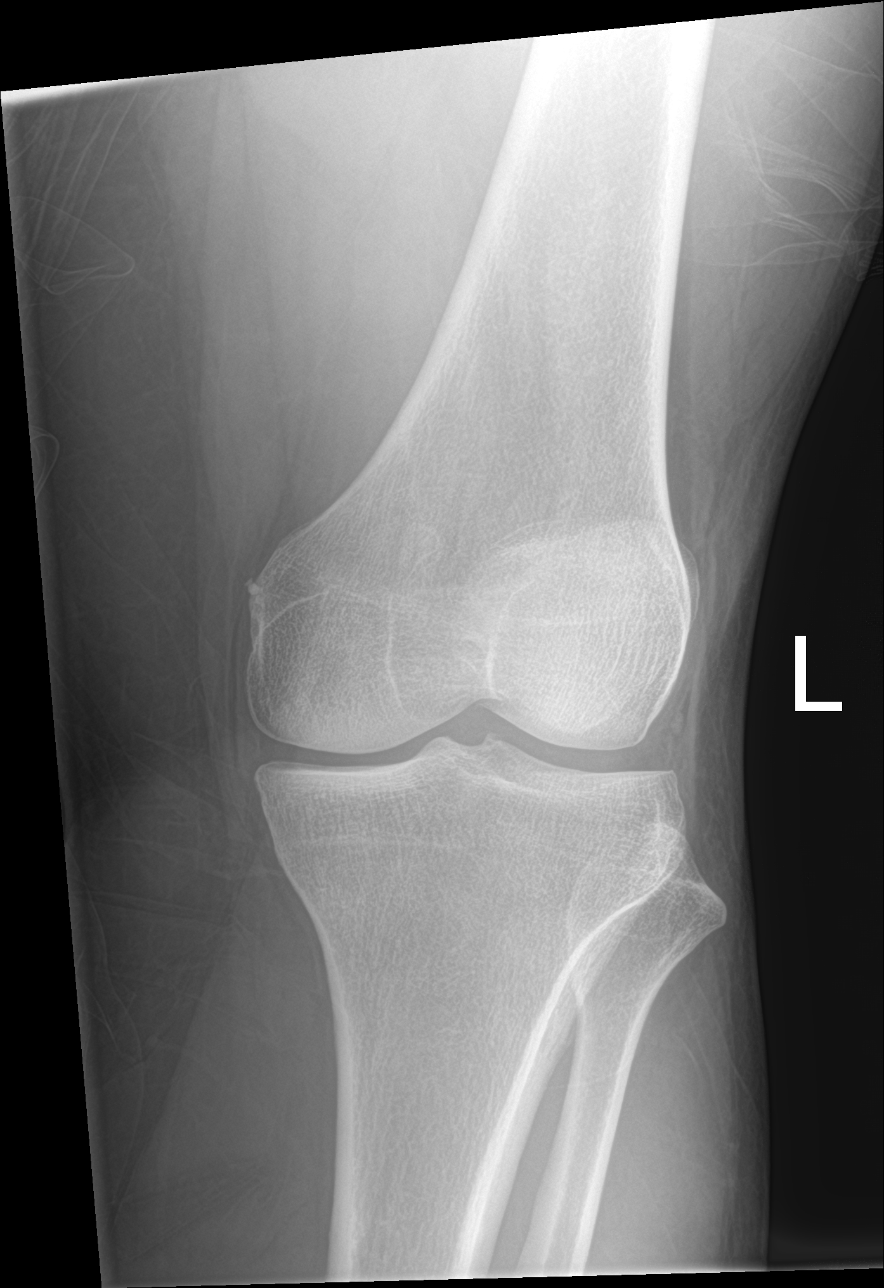

[knee ap]
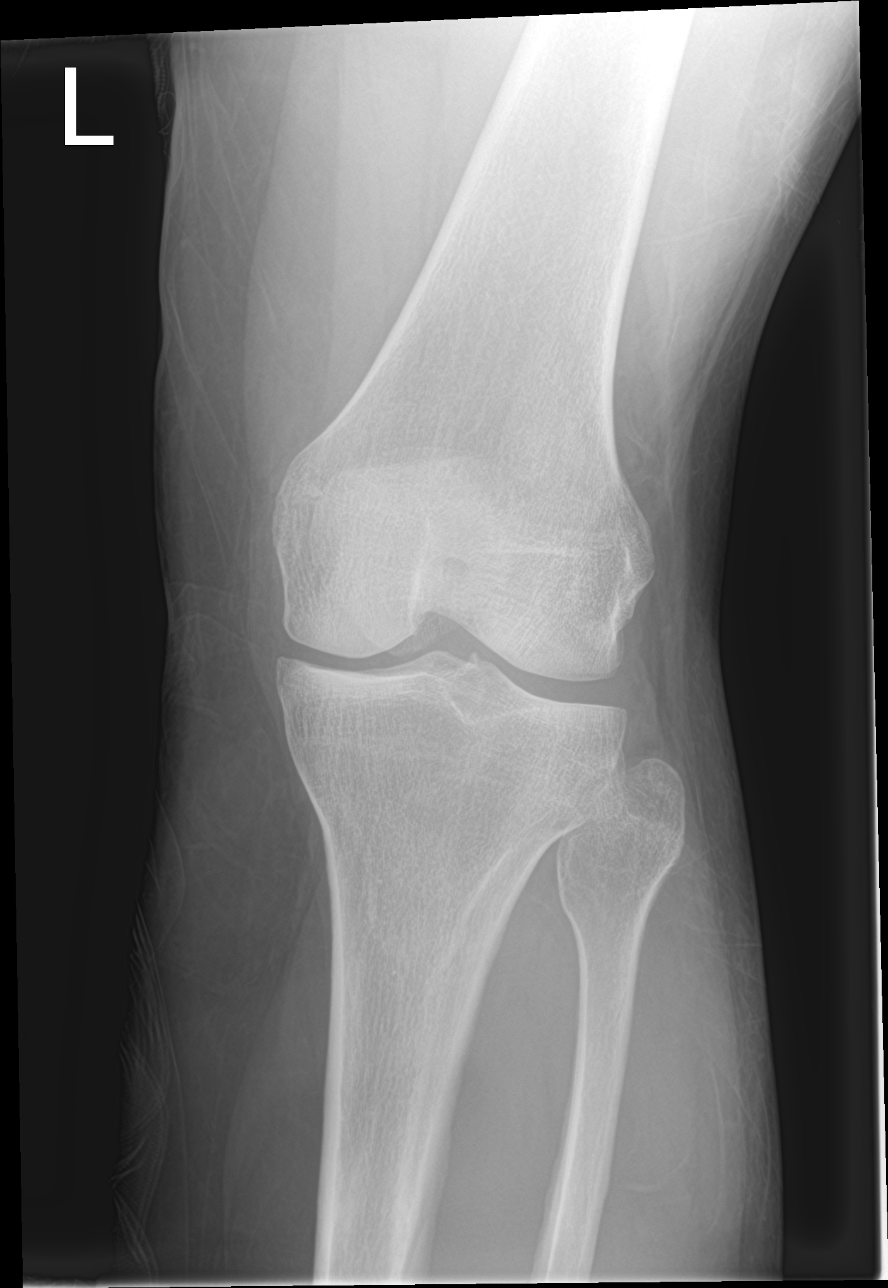

[4 of 4 positions shown; findings below may reference images not displayed]

FINDINGS: No evidence of fracture, dislocation, or joint effusion. No evidence
of arthropathy or other focal bone abnormality. Soft tissues are
unremarkable.
IMPRESSION: Negative.

## 2020-01-16 IMAGING — CT CT HEAD W/O CM
3 series · 14 of 47 positions shown, 16 images · non-contrast
Comparison: C-spine evaluation of the same date.

CLINICAL DATA: 39-year-old with fall down steps.

EXAM:
CT HEAD WITHOUT CONTRAST
TECHNIQUE: Contiguous axial images were obtained from the base of the skull
through the vertex without intravenous contrast.

[Series 3: head 5.0 h30s · axial · 0.42mm/px · z∈[-38,+87]mm · 8 of 31 slices shown, 10 images]
[im 3/31  brain]
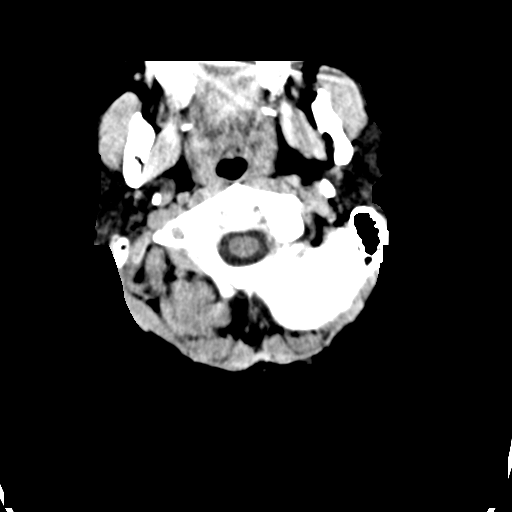
[im 3/31  bone]
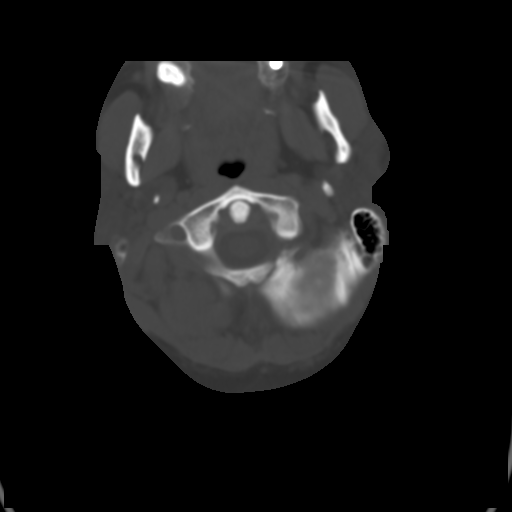
[im 7/31  brain]
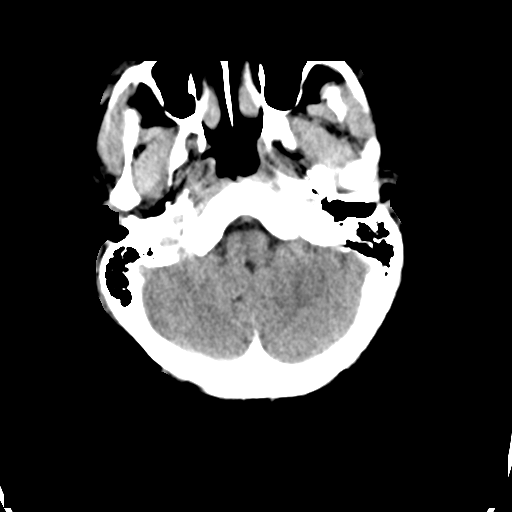
[im 10/31  brain]
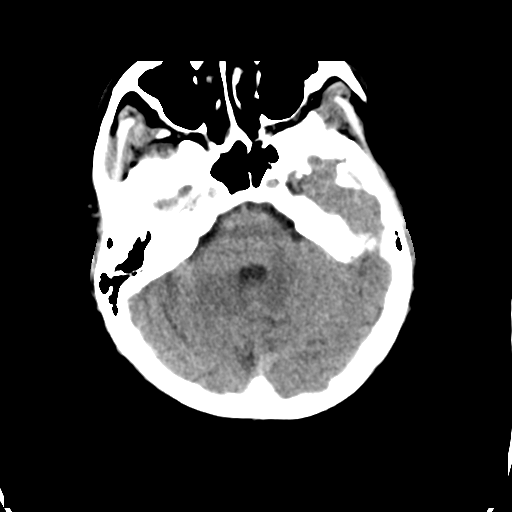
[im 14/31  brain]
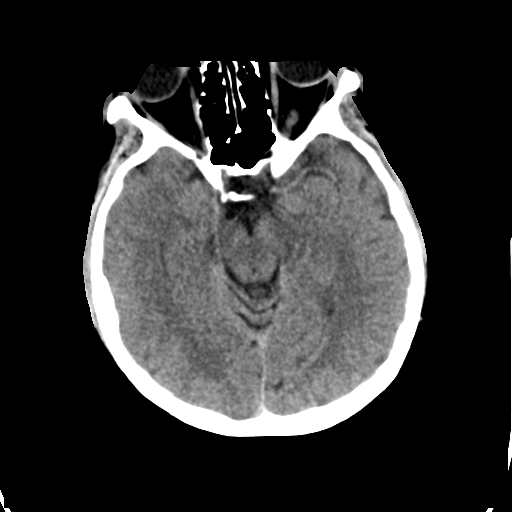
[im 17/31  brain]
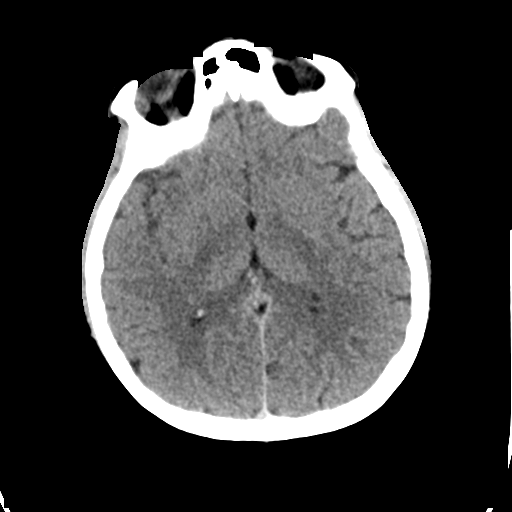
[im 17/31  bone]
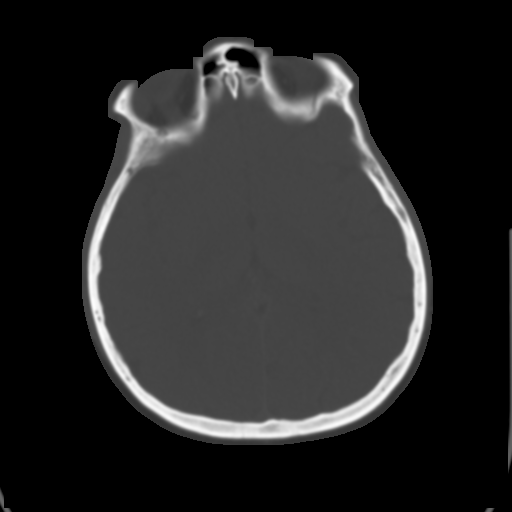
[im 21/31  brain]
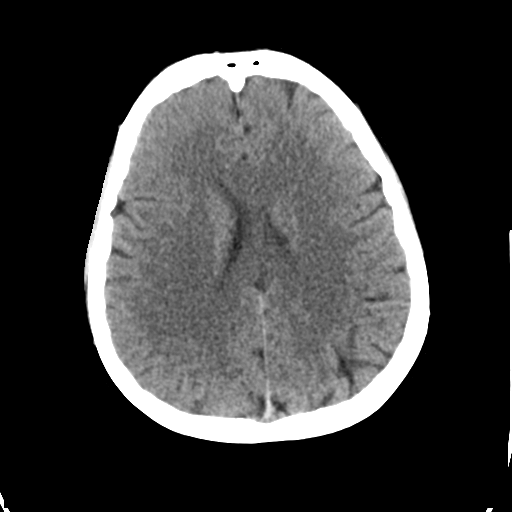
[im 24/31  brain]
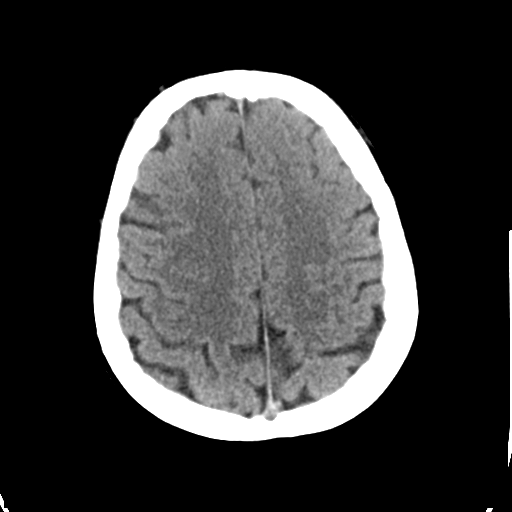
[im 28/31  brain]
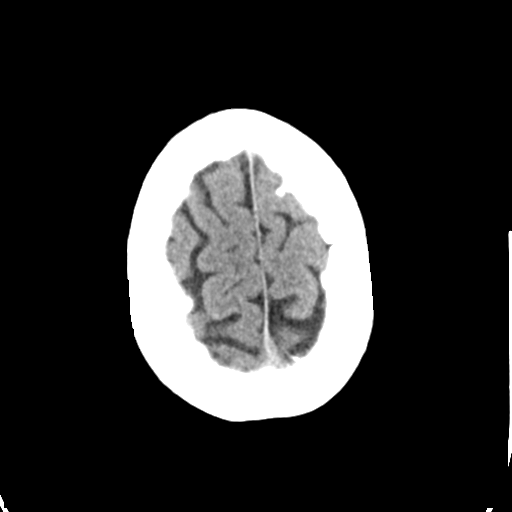

[Series 5: head 3.0 mpr cor · coronal · 0.29mm/px · 3 of 70 slices shown]
[im 24/70  brain]
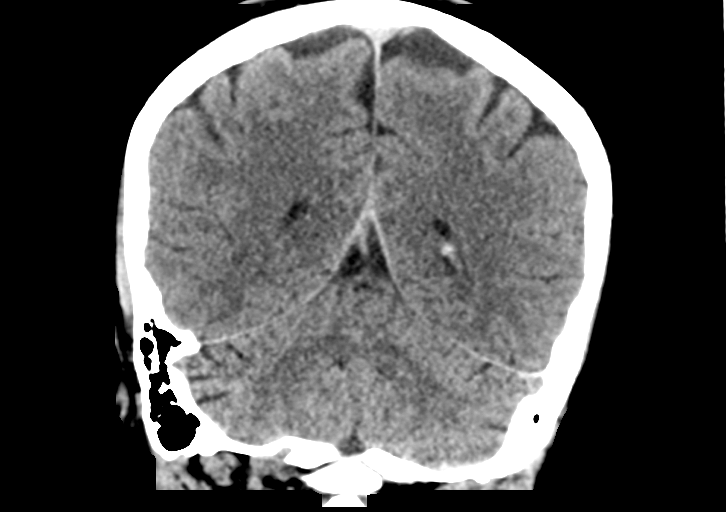
[im 31/70  brain]
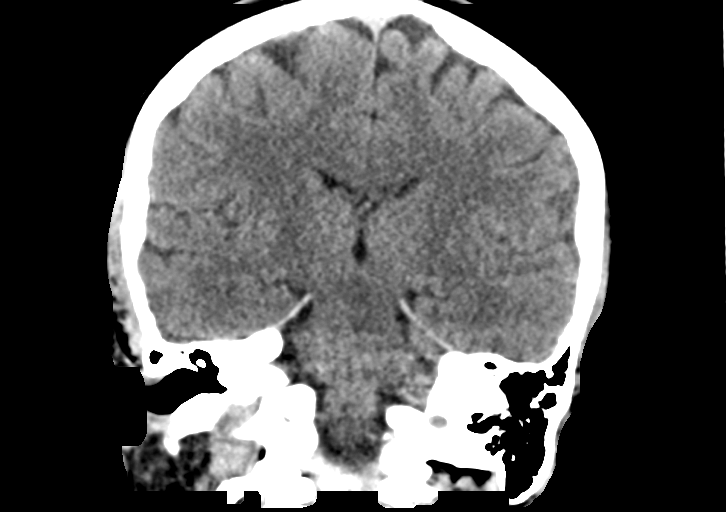
[im 39/70  brain]
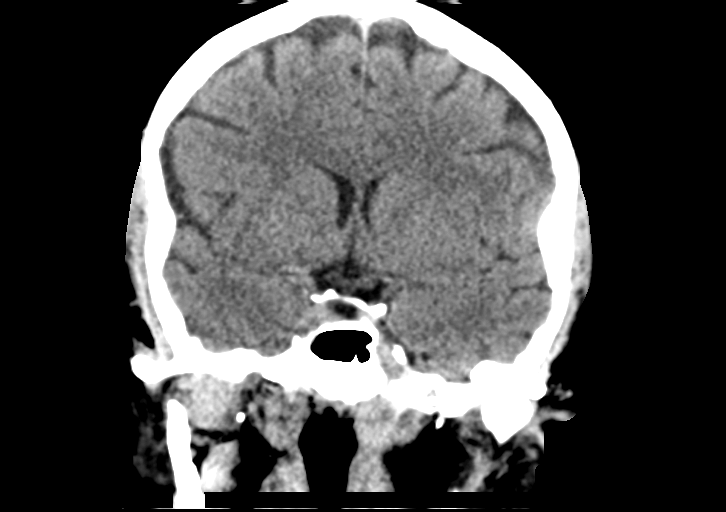

[Series 6: head 3.0 mpr sag · sagittal · 0.30mm/px · 3 of 66 slices shown]
[im 22/66  brain]
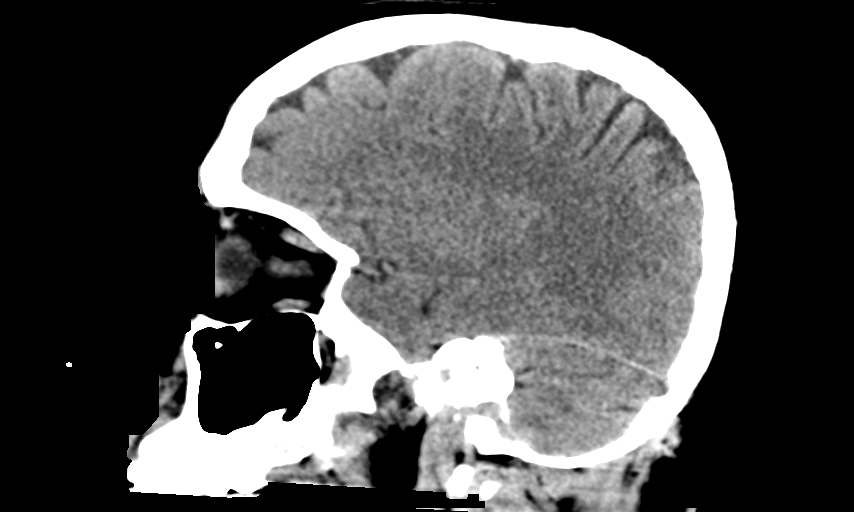
[im 33/66  brain]
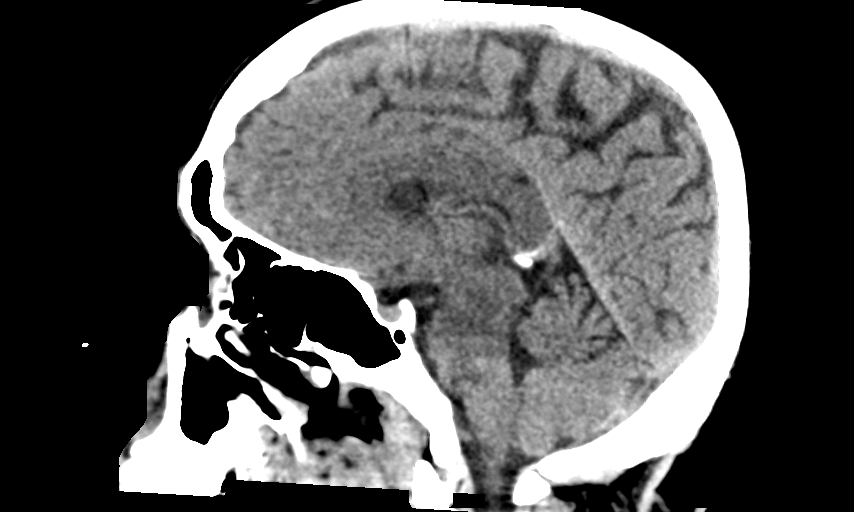
[im 44/66  brain]
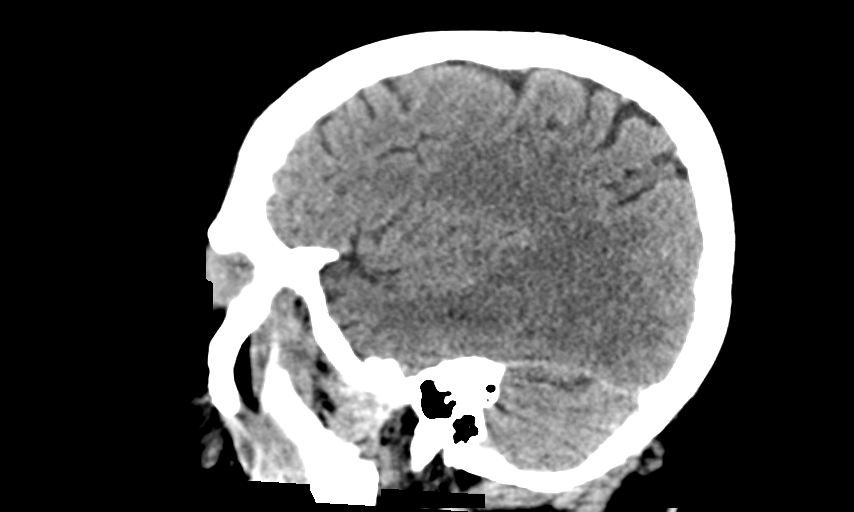

[14 of 47 positions shown; findings below may reference images not displayed]

FINDINGS: Brain: No evidence of acute infarction, hemorrhage, hydrocephalus,
extra-axial collection or mass lesion/mass effect.

Vascular: No hyperdense vessel or unexpected calcification.

Skull: Normal. Negative for fracture or focal lesion.

Sinuses/Orbits: No acute finding.

Other: None.
IMPRESSION: No acute intracranial abnormality.

## 2020-01-16 IMAGING — CT CT CERVICAL SPINE W/O CM
3 of 4 series · 13 of 33 positions shown, 16 images · non-contrast
Comparison: None.

CLINICAL DATA: Fall, level 2 trauma.  Neck pain

EXAM:
CT CERVICAL SPINE WITHOUT CONTRAST
TECHNIQUE: Multidetector CT imaging of the cervical spine was performed without
intravenous contrast. Multiplanar CT image reconstructions were also
generated.

[Series 4: c_spine 2.0 st · axial · 0.37mm/px · z∈[-148,-28]mm · 5 of 90 slices shown, 7 images]
[im 15/90  soft-tissue]
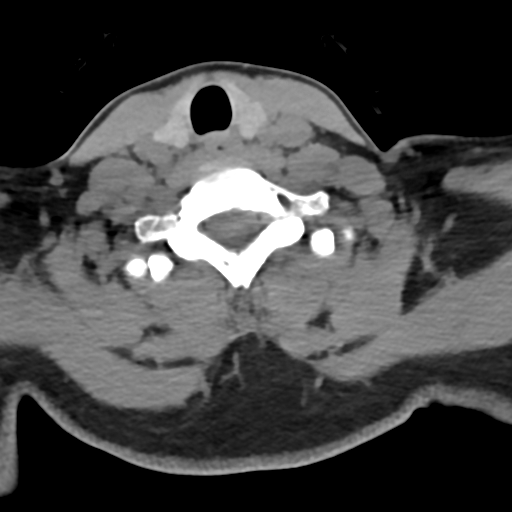
[im 15/90  bone]
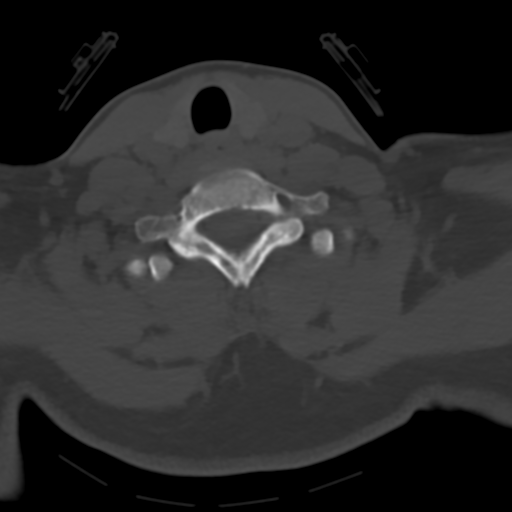
[im 30/90  bone]
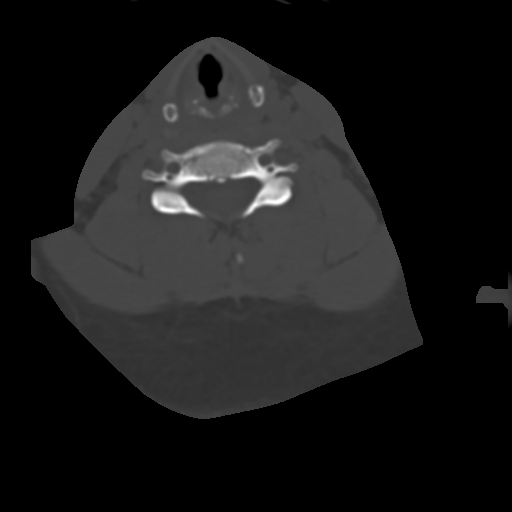
[im 45/90  bone]
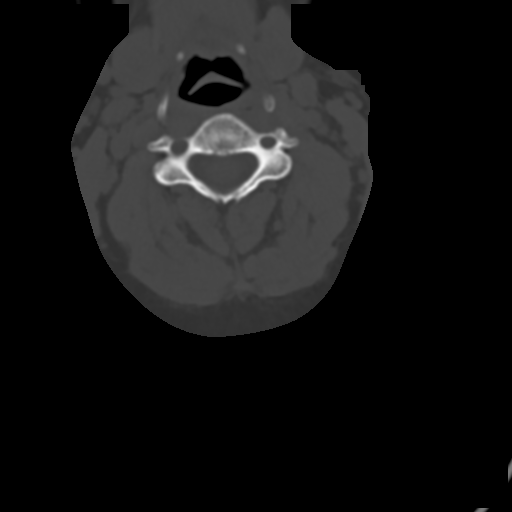
[im 60/90  bone]
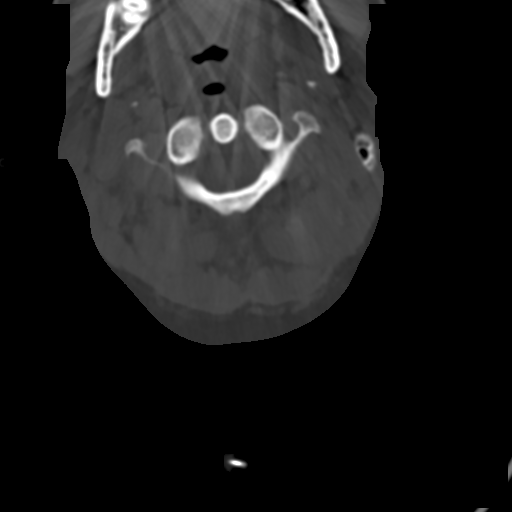
[im 75/90  soft-tissue]
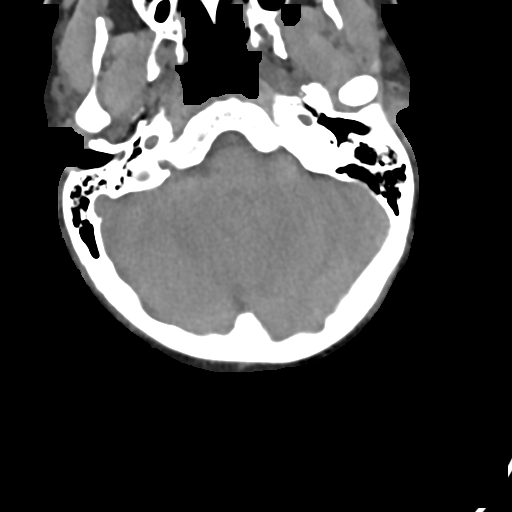
[im 75/90  bone]
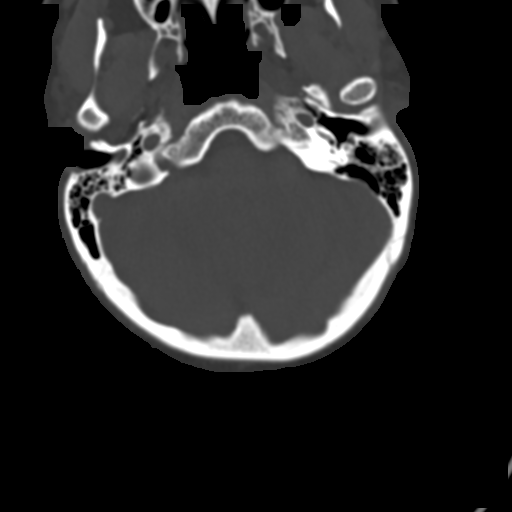

[Series 5: coronal bone · coronal · 0.23mm/px · 3 of 61 slices shown]
[im 13/61  bone]
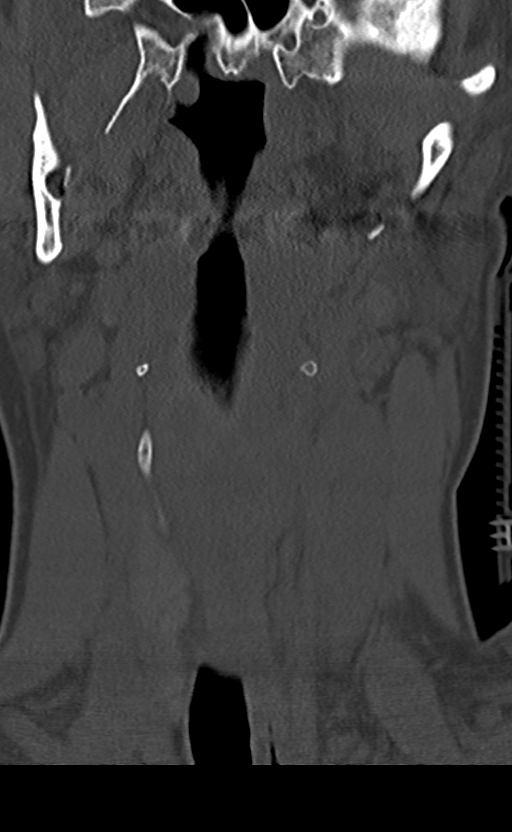
[im 25/61  bone]
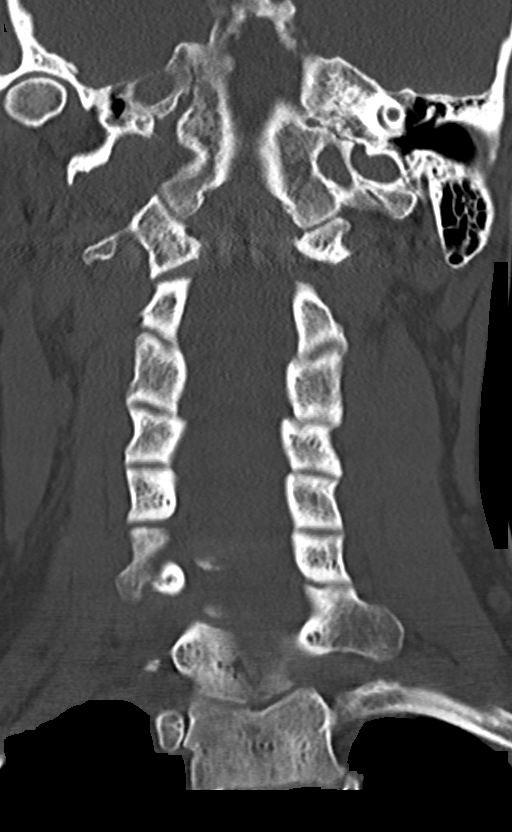
[im 37/61  bone]
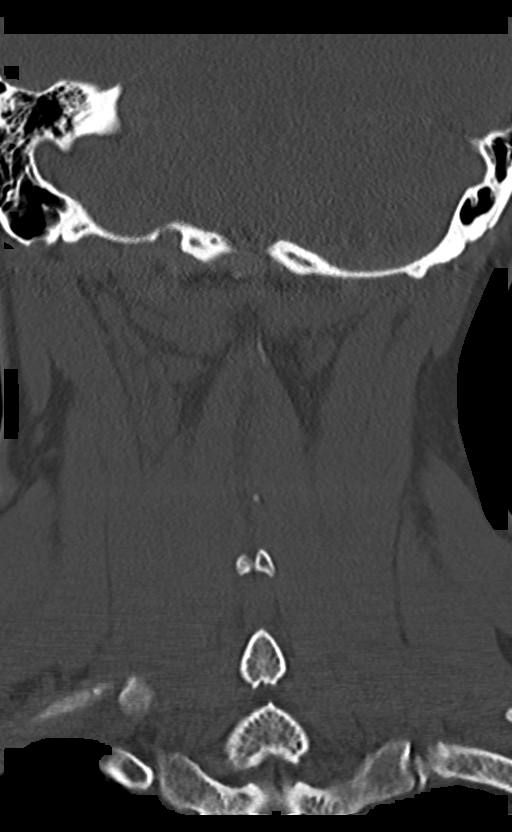

[Series 6: sagittal bone · sagittal · 0.32mm/px · 5 of 61 slices shown, 6 images]
[im 21/61  bone]
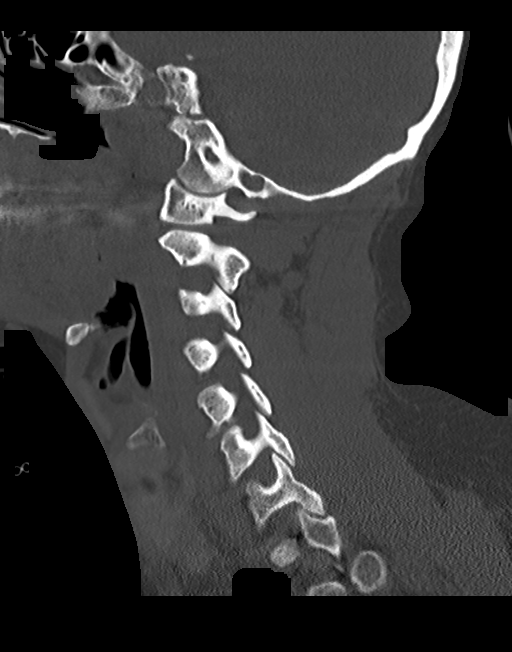
[im 26/61  bone]
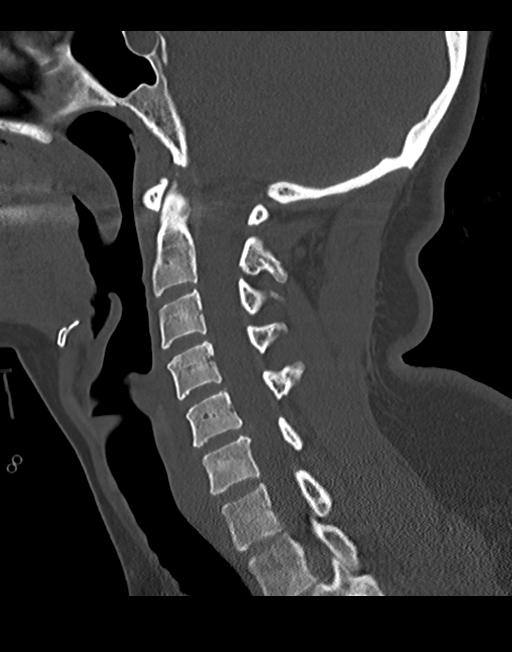
[im 31/61  soft-tissue]
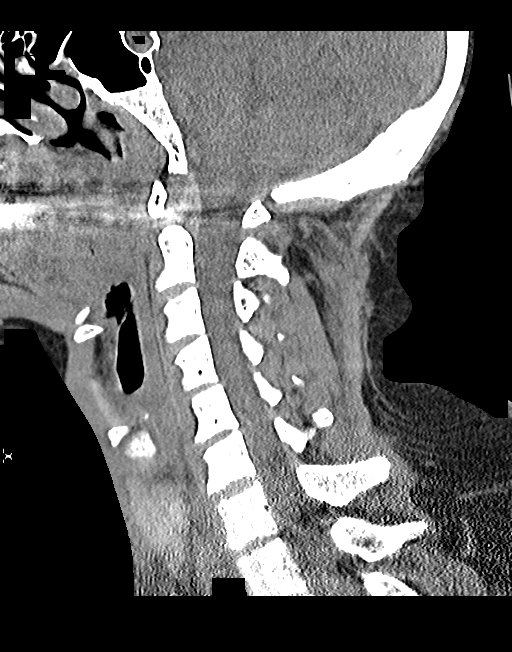
[im 31/61  bone]
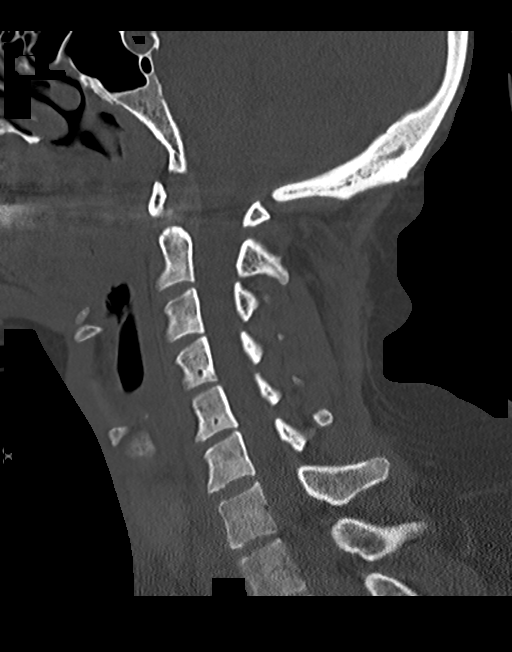
[im 36/61  bone]
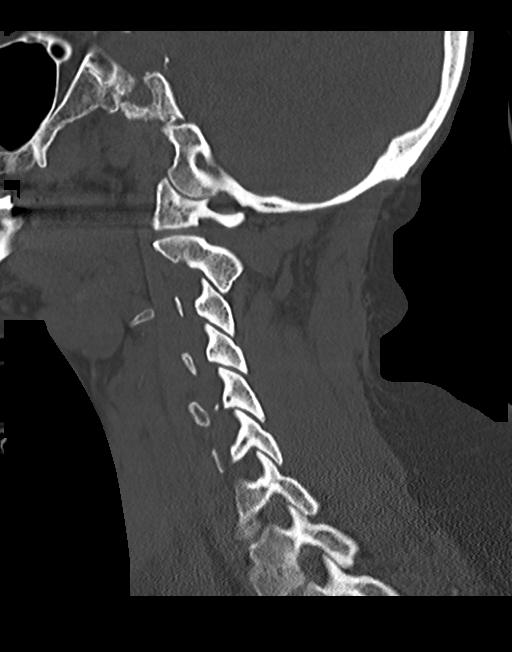
[im 41/61  bone]
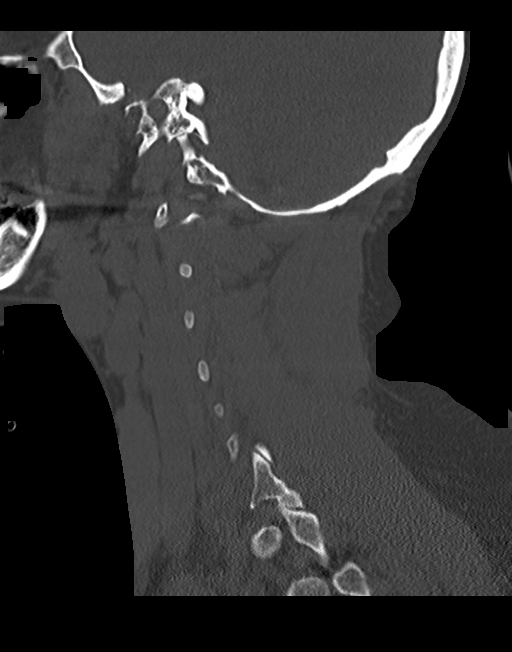

[13 of 33 positions shown; findings below may reference images not displayed]

FINDINGS: Alignment: Facet joints are aligned without dislocation or traumatic
listhesis. Dens and lateral masses are aligned.

Skull base and vertebrae: No acute fracture. No primary bone lesion
or focal pathologic process.

Soft tissues and spinal canal: No prevertebral fluid or swelling. No
visible canal hematoma.

Disc levels: Intervertebral disc heights are well preserved. No
significant facet arthropathy.

Upper chest: Visualized lung apices are clear.

Other: None.
IMPRESSION: No acute fracture or traumatic listhesis of the cervical spine.

## 2020-01-16 IMAGING — MR MR FEMUR*L* W/O CM
6 series · 40 of 40 positions shown · non-contrast
Comparison: None.

CLINICAL DATA: Fall, pain

EXAM:
MR OF THE LEFT HIP and femur WITHOUT CONTRAST
TECHNIQUE: Multiplanar, multisequence MR imaging was performed. No intravenous
contrast was administered.

[Series 5: composed cor t1_comp_filt · coronal · left · 6.0mm · 0.97mm/px · 5 of 32 slices shown]
[im 1/32]
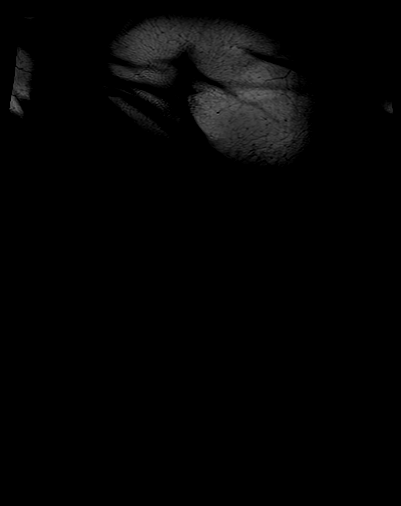
[im 8/32]
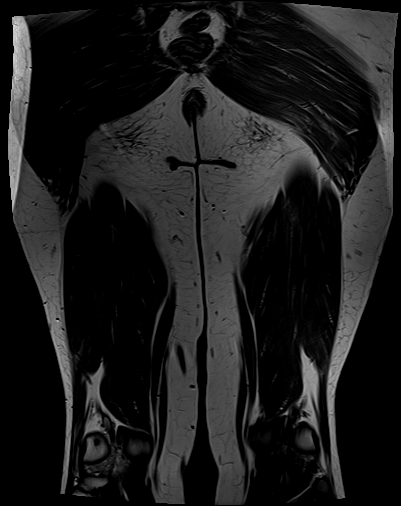
[im 16/32]
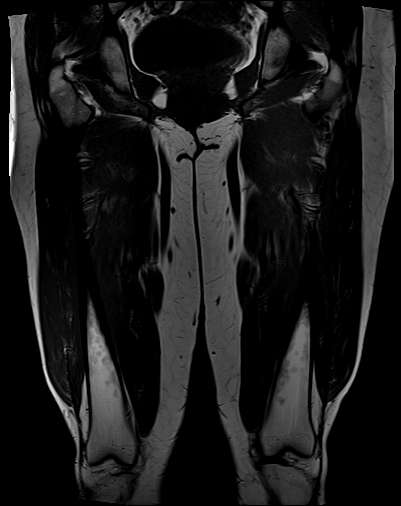
[im 24/32]
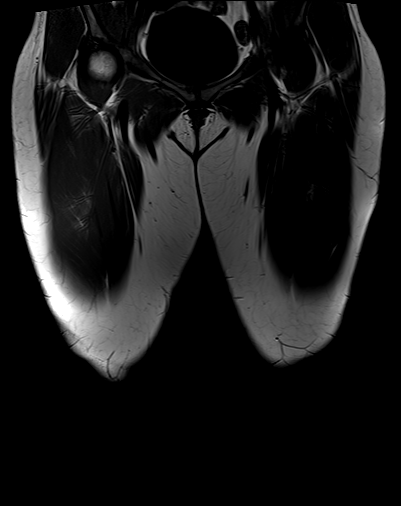
[im 32/32]
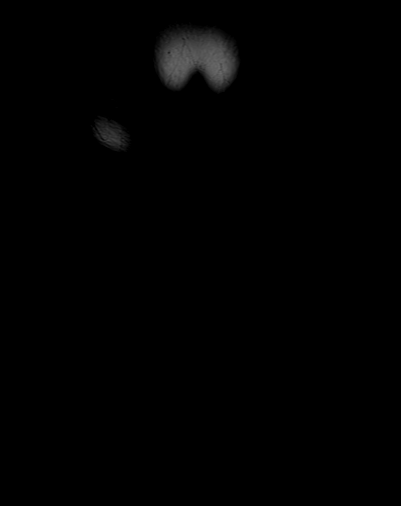

[Series 9: composed cor stir_comp_filt · coronal · left · 6.0mm · 0.97mm/px · 5 of 32 slices shown]
[im 1/32]
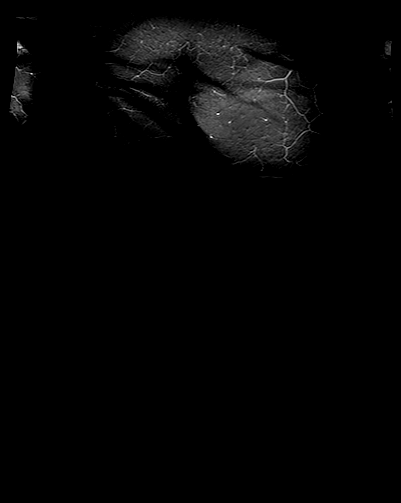
[im 8/32]
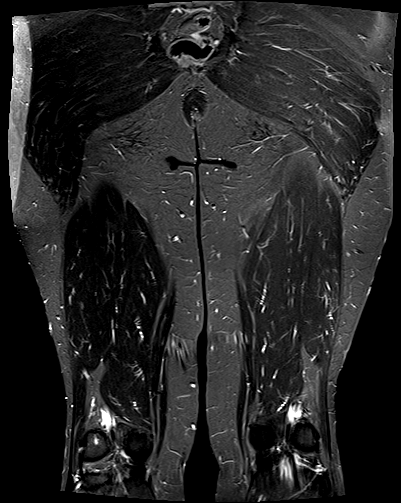
[im 16/32]
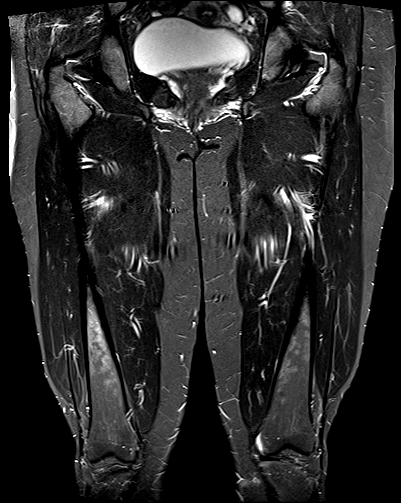
[im 24/32]
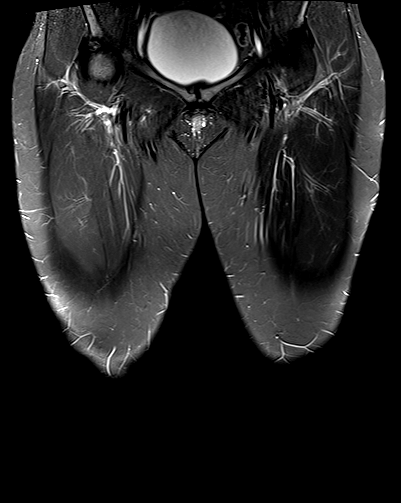
[im 32/32]
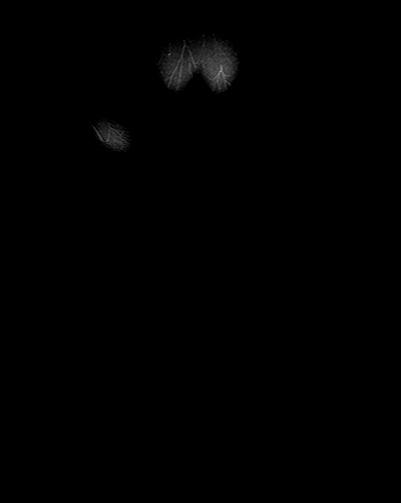

[Series 15: T2 · sagittal · left · 5.0mm · 1.15mm/px · 6 of 38 slices shown (1 of 3)]
[im 1/38]
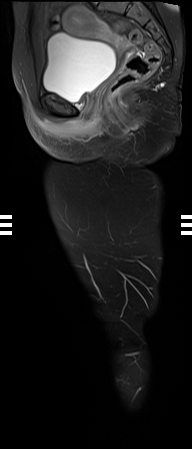
[im 8/38]
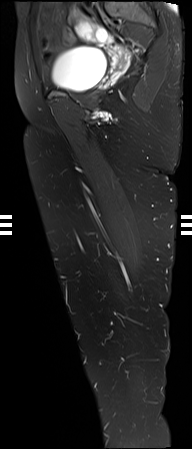
[im 15/38]
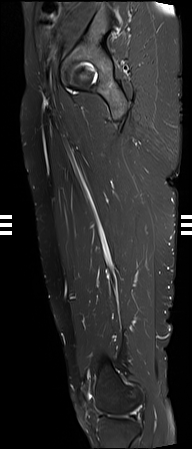
[im 23/38]
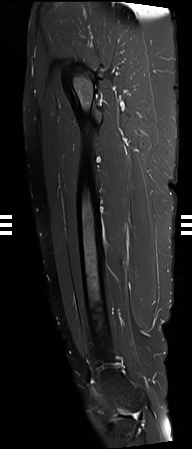
[im 30/38]
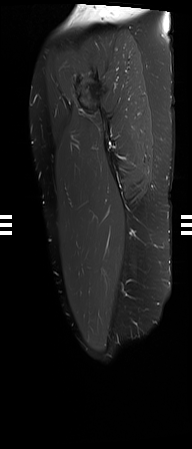
[im 38/38]
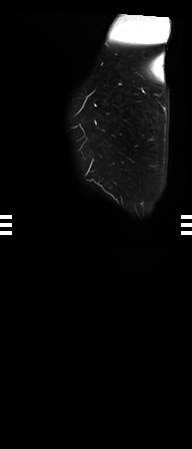

[Series 18: ax t1_comp · axial · left · 5.0mm · 0.86mm/px · z∈[-212,+238]mm · 12 of 76 slices shown]
[im 1/76]
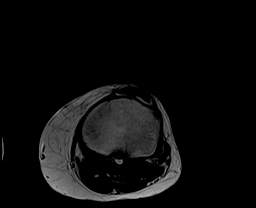
[im 7/76]
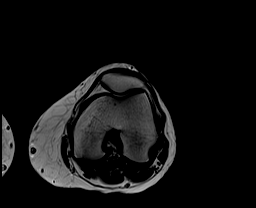
[im 14/76]
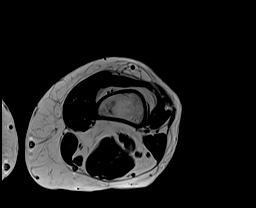
[im 21/76]
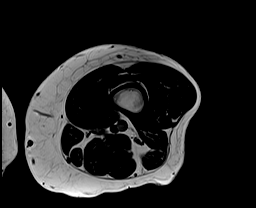
[im 28/76]
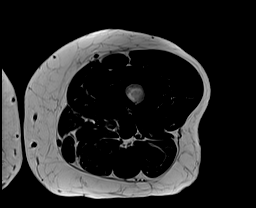
[im 35/76]
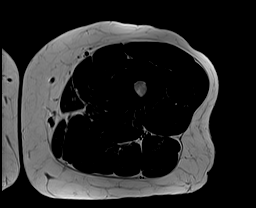
[im 41/76]
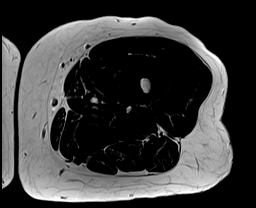
[im 48/76]
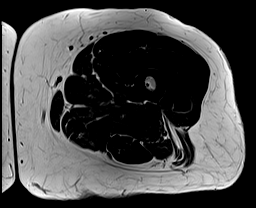
[im 55/76]
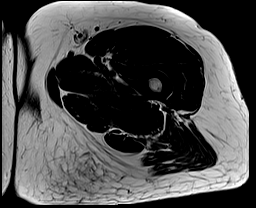
[im 62/76]
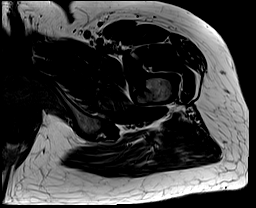
[im 69/76]
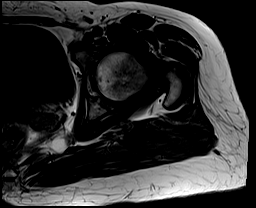
[im 76/76]
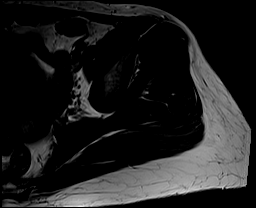

[Series 21: T2 · axial · left · 5.0mm · 1.06mm/px · z∈[+4,+238]mm · 6 of 40 slices shown (2 of 3)]
[im 1/40]
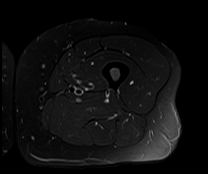
[im 8/40]
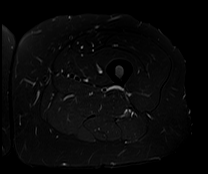
[im 16/40]
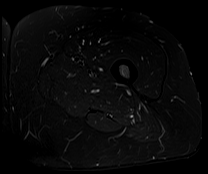
[im 24/40]
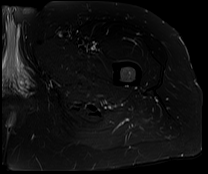
[im 32/40]
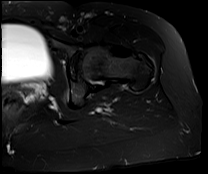
[im 40/40]
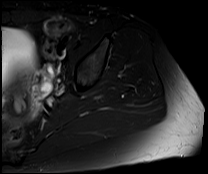

[Series 21: T2 · axial · left · 5.0mm · 1.06mm/px · z∈[-212,-2]mm · 6 of 36 slices shown (3 of 3)]
[im 1/36]
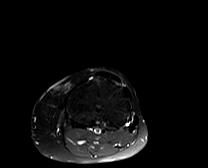
[im 8/36]
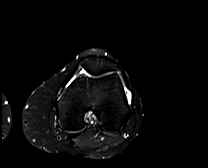
[im 15/36]
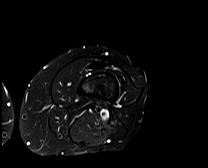
[im 22/36]
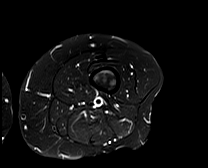
[im 29/36]
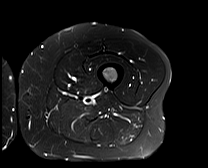
[im 36/36]
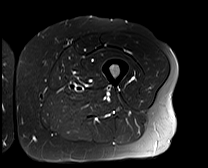

[40 of 40 positions shown; findings below may reference images not displayed]

FINDINGS: Bones/Joint/Cartilage

Normal osseous marrow signal seen throughout. No osseous fracture or
marrow edema. No areas of cortical destruction or periosteal
reaction. The articular surfaces are well maintained.

Ligaments

The ligamentum teres is intact.

Muscles and Tendons
Mildly increased signal seen within the obturator internus
musculature. The remainder of the muscles surrounding the hip and
femur are normal appearance without focal atrophy or edema.
Minimally increased signal seen at the hamstrings and gluteal tendon
insertion site. The iliopsoas tendon is intact.

Soft tissue
No focal soft tissue swelling or soft tissue mass is seen. The
visualized portion of the deep pelvis is grossly unremarkable. A
small amount of physiologic free fluid seen within the deep
cul-de-sac.
IMPRESSION: Mildly increased signal within the internal obturator musculature,
likely mild muscular strain. No osseous fracture or marrow edema.

Mild insertional hamstrings and gluteal tendinosis

## 2020-01-16 IMAGING — CT CT CHEST W/ CM
3 of 5 series · 14 of 36 positions shown, 17 images · IV contrast (omnipaque)
Comparison: Plain films from earlier in the same day.

CLINICAL DATA: Fall downstairs with left hip pain, initial
encounter

EXAM:
CT CHEST, ABDOMEN, AND PELVIS WITH CONTRAST
TECHNIQUE: Multidetector CT imaging of the chest, abdomen and pelvis was
performed following the standard protocol during bolus
administration of intravenous contrast.
CONTRAST:  100mL OMNIPAQUE IOHEXOL 300 MG/ML  SOLN

[Series 3: cap 5.0 i31f 2 · axial · 0.87mm/px · z∈[-711,-151]mm · 9 of 140 slices shown, 12 images]
[im 14/140  mediastinal]
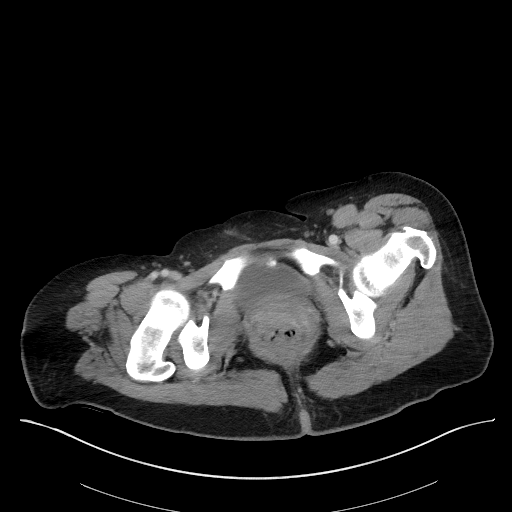
[im 14/140  lung]
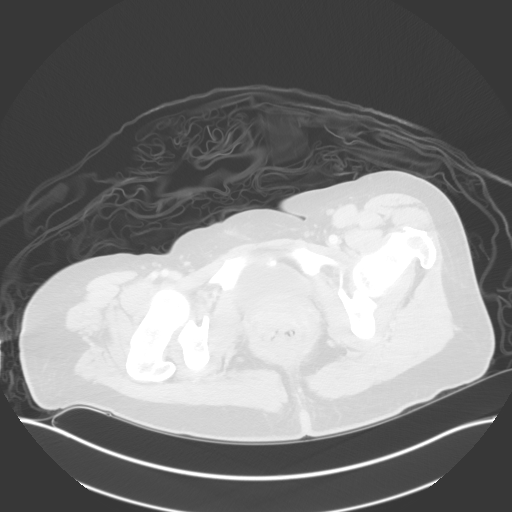
[im 28/140  lung]
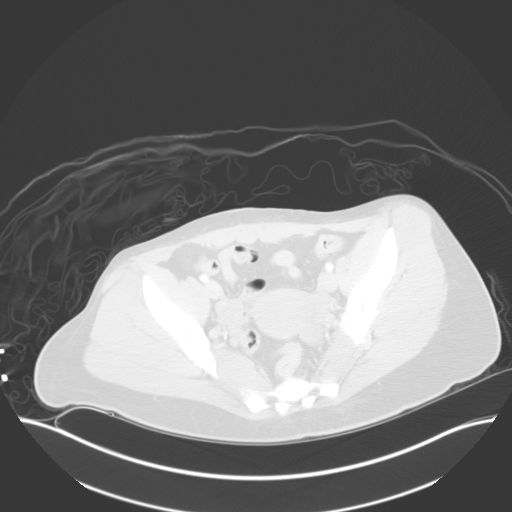
[im 42/140  lung]
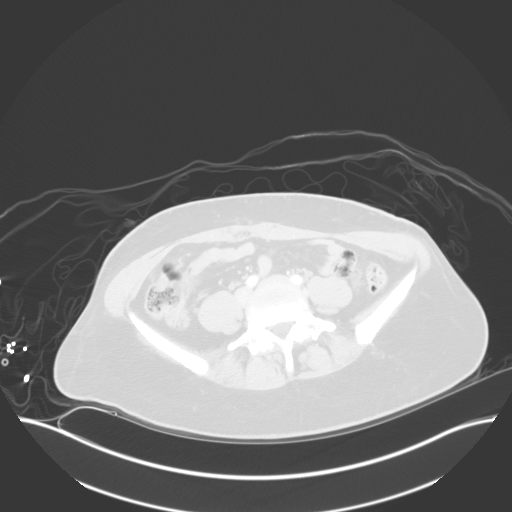
[im 56/140  lung]
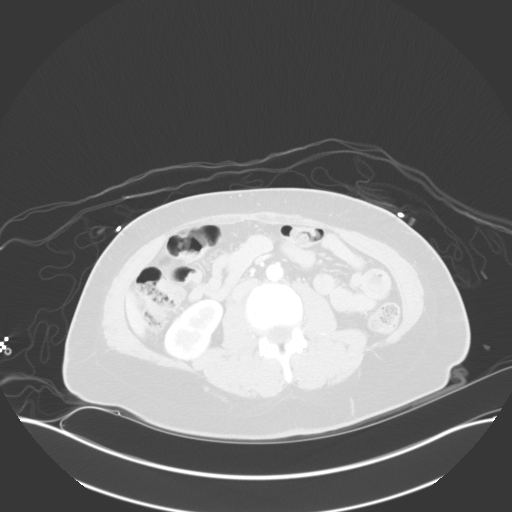
[im 70/140  mediastinal]
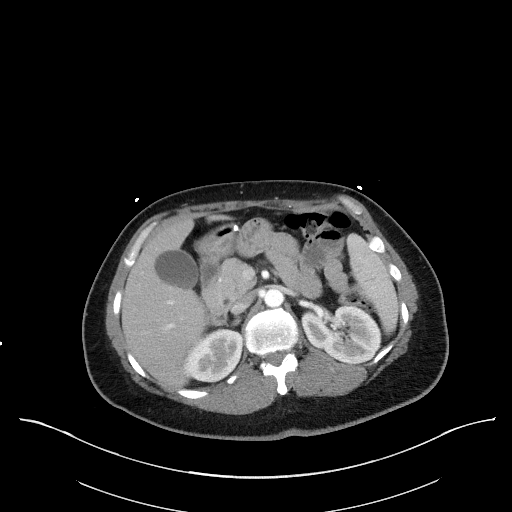
[im 70/140  lung]
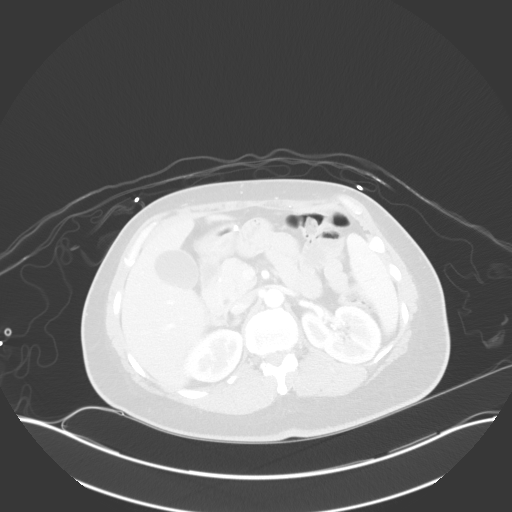
[im 84/140  lung]
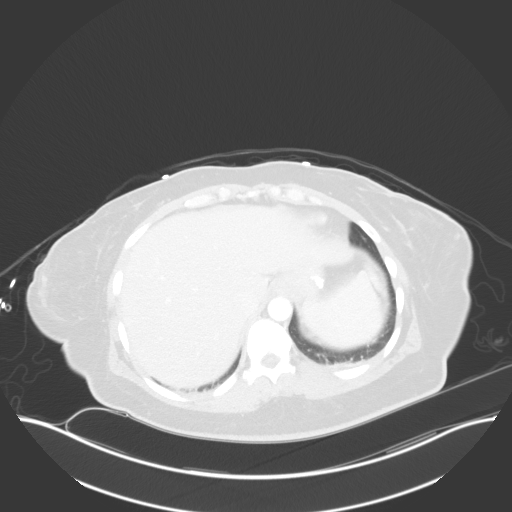
[im 98/140  lung]
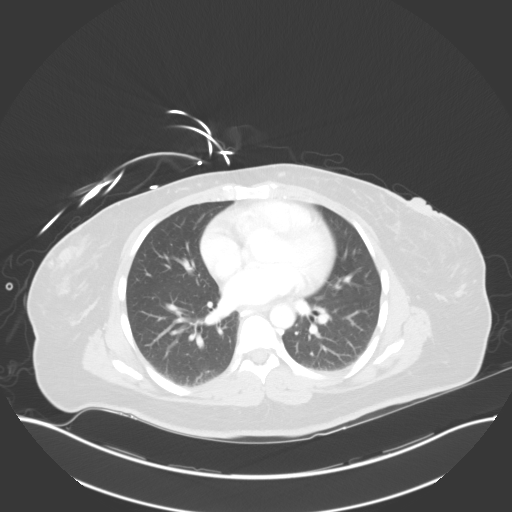
[im 112/140  lung]
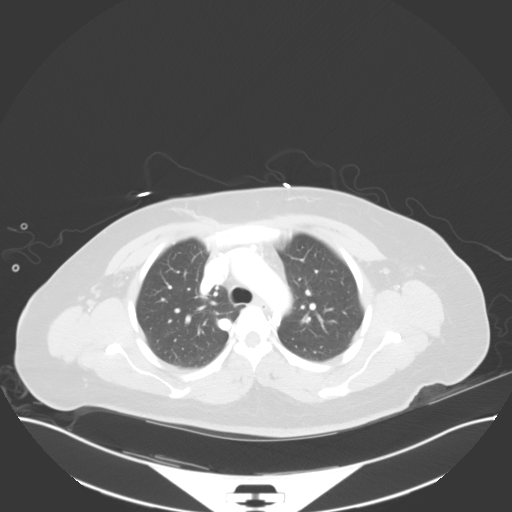
[im 126/140  mediastinal]
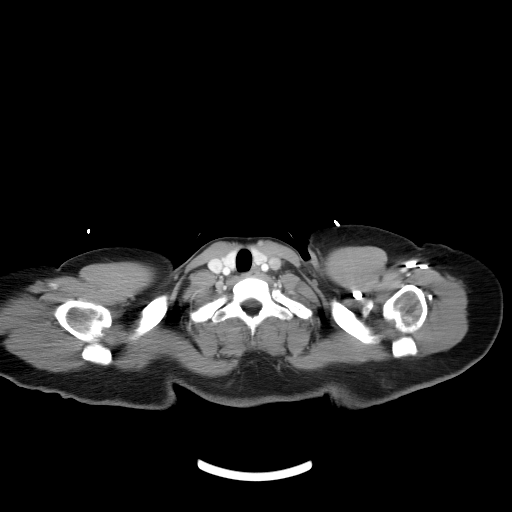
[im 126/140  lung]
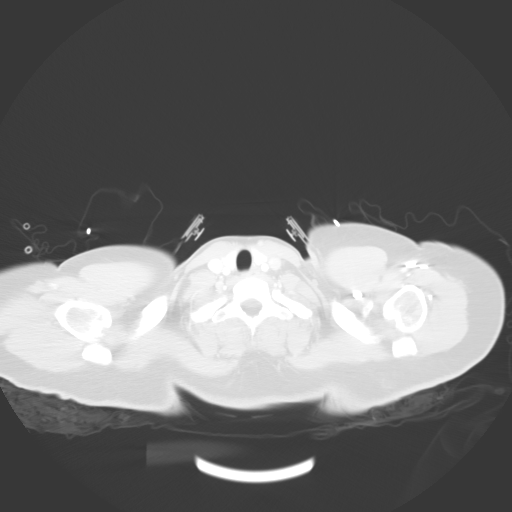

[Series 4: lungs · axial · 0.87mm/px · z∈[-393,-341]mm · 2 of 169 slices shown]
[im 13/169  lung]
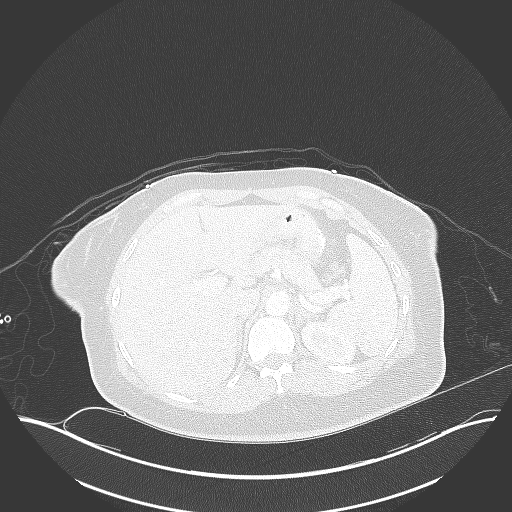
[im 39/169  lung]
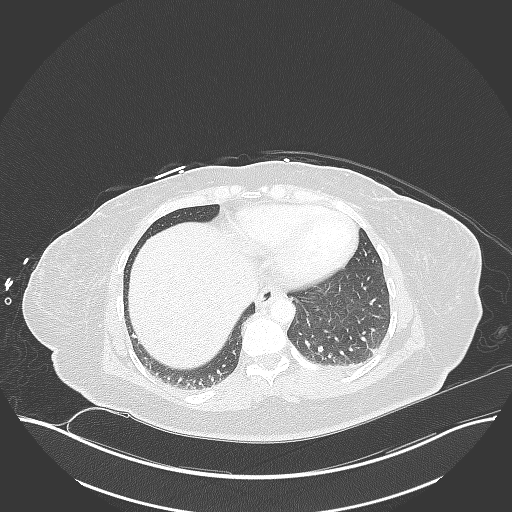

[Series 6: coronal · coronal · 0.84mm/px · 3 of 151 slices shown]
[im 31/151  lung]
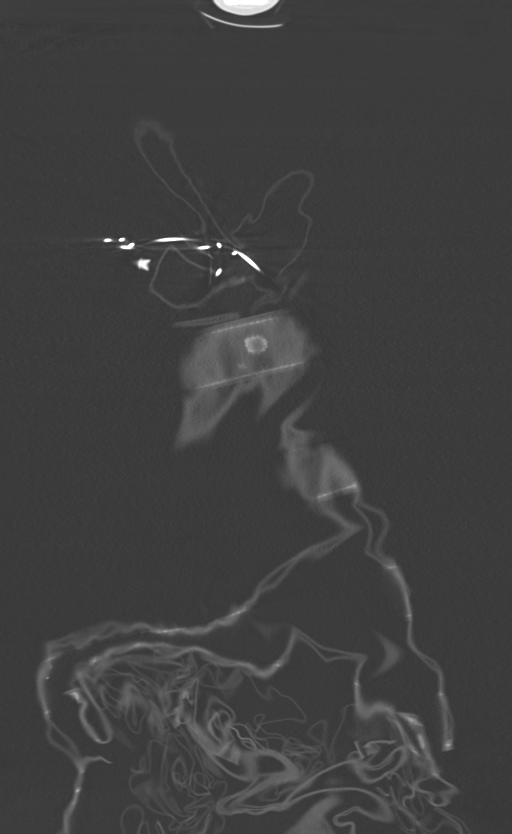
[im 61/151  lung]
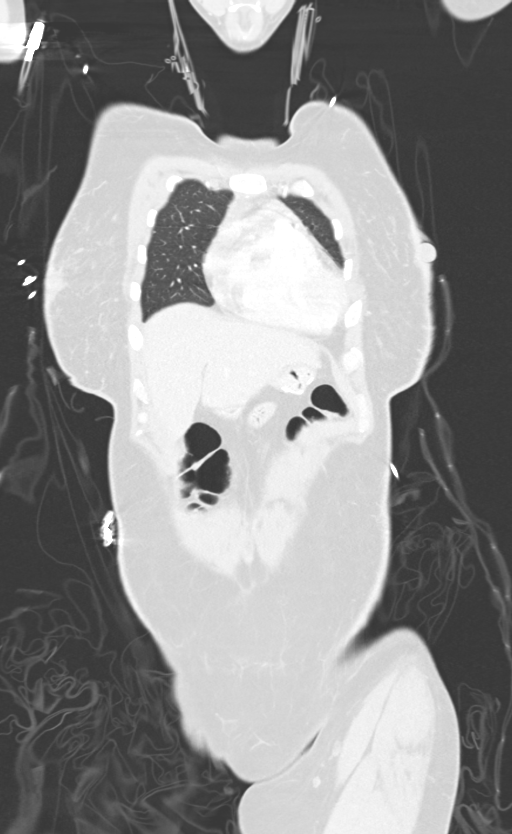
[im 91/151  lung]
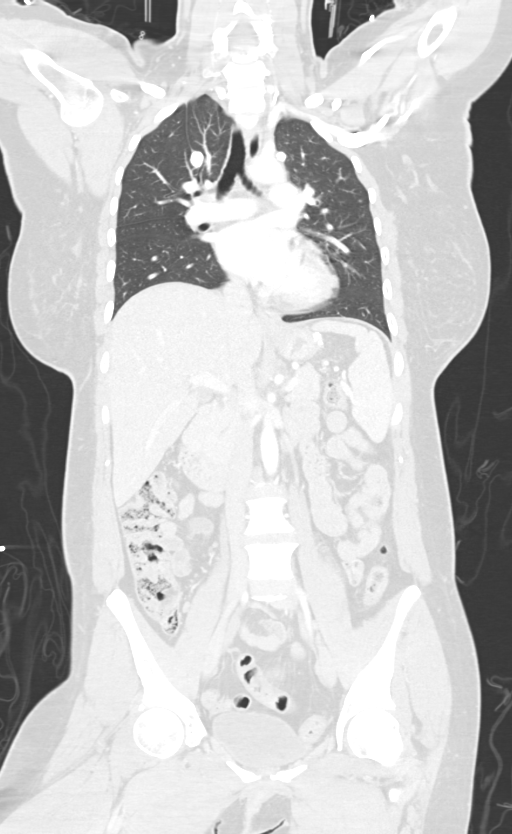

[14 of 36 positions shown; findings below may reference images not displayed]

FINDINGS: CT CHEST FINDINGS

Cardiovascular: Thoracic aorta and its branches are within normal
limits. No cardiac enlargement is noted. The pulmonary artery as
visualized is within normal limits. No coronary calcifications are
seen.

Mediastinum/Nodes: Thoracic inlet is within normal limits. No
sizable hilar or mediastinal adenopathy is noted. No mediastinal
hematoma is seen. The esophagus is within normal limits.

Lungs/Pleura: Lungs are well aerated bilaterally. No focal
infiltrate, effusion or pneumothorax is noted.

Musculoskeletal: No acute bony abnormality is seen.

CT ABDOMEN PELVIS FINDINGS

Hepatobiliary: Gallbladder is within normal limits. Fatty liver is
noted.

Pancreas: Unremarkable. No pancreatic ductal dilatation or
surrounding inflammatory changes.

Spleen: Normal in size without focal abnormality.

Adrenals/Urinary Tract: Adrenal glands are within normal limits.
Kidneys are well visualized bilaterally. Bladder is within normal
limits. No obstructive changes are seen.

Stomach/Bowel: No obstructive or inflammatory changes of the colon
are noted. Changes of prior gastric bypass are noted. No small bowel
abnormality is noted.

Vascular/Lymphatic: No significant vascular findings are present. No
enlarged abdominal or pelvic lymph nodes.

Reproductive: Uterus is within normal limits. Few small cysts are
noted within the left ovary. No adnexal mass is noted.

Other: No abdominal wall hernia or abnormality. No abdominopelvic
ascites.

Musculoskeletal: No acute or significant osseous findings.
IMPRESSION: CT of the chest: No acute abnormality noted.

CT of the abdomen and pelvis: Fatty liver.

No acute posttraumatic abnormality is noted.

## 2020-01-16 IMAGING — CR DG FEMUR 2+V*L*
4 series · 4 of 4 positions shown · non-contrast
Comparison: None.

CLINICAL DATA: Fall

EXAM:
LEFT FEMUR 2 VIEWS

[femur ap (1 of 2)]
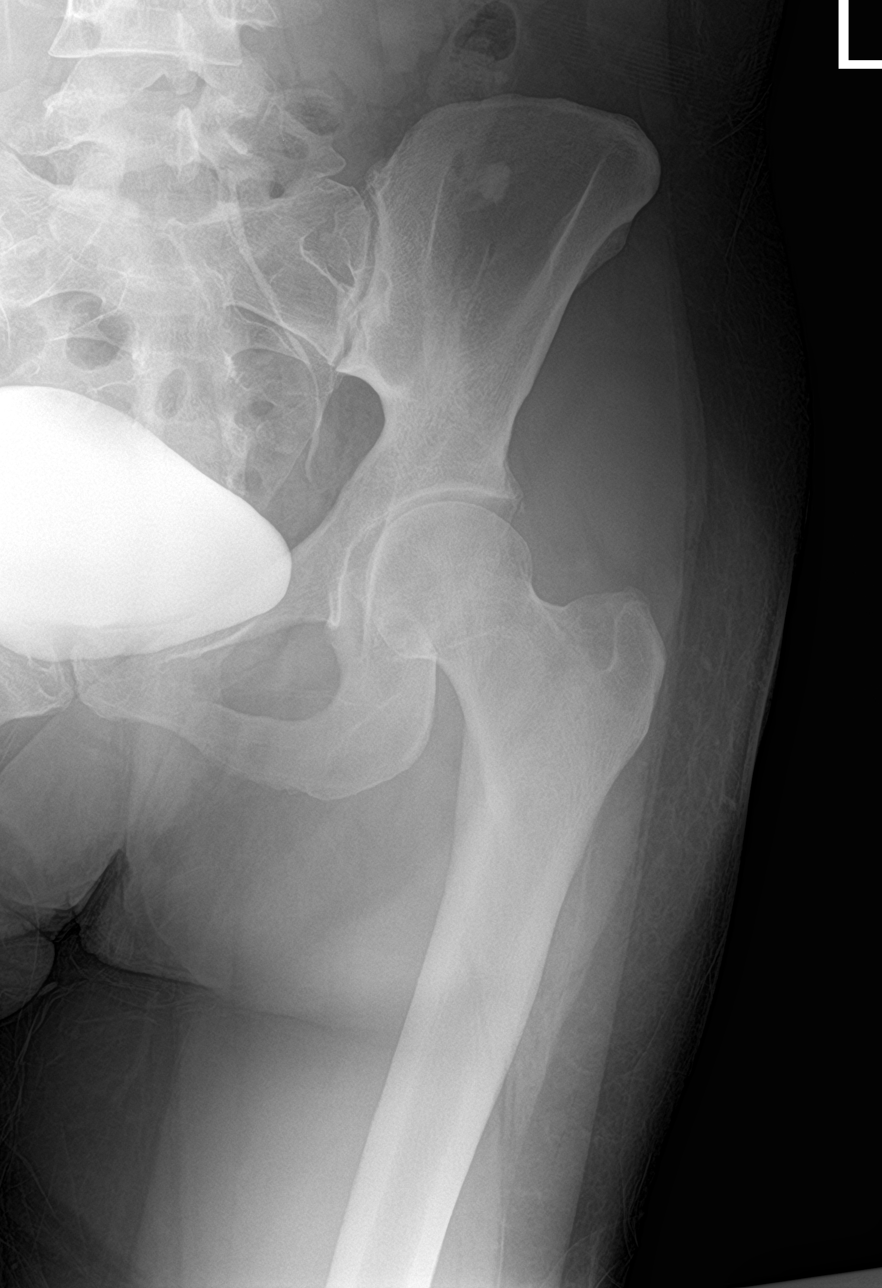

[femur ap (2 of 2)]
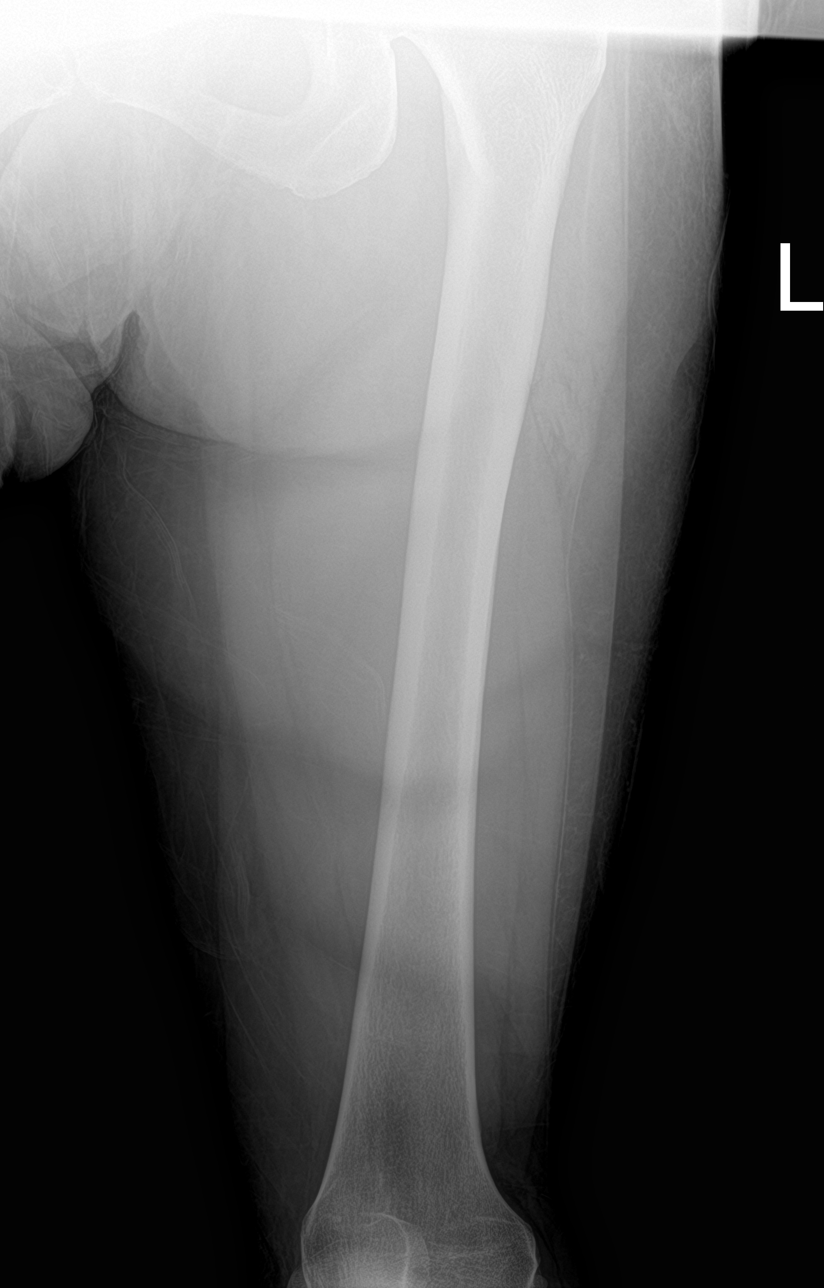

[femur lat (1 of 2)]
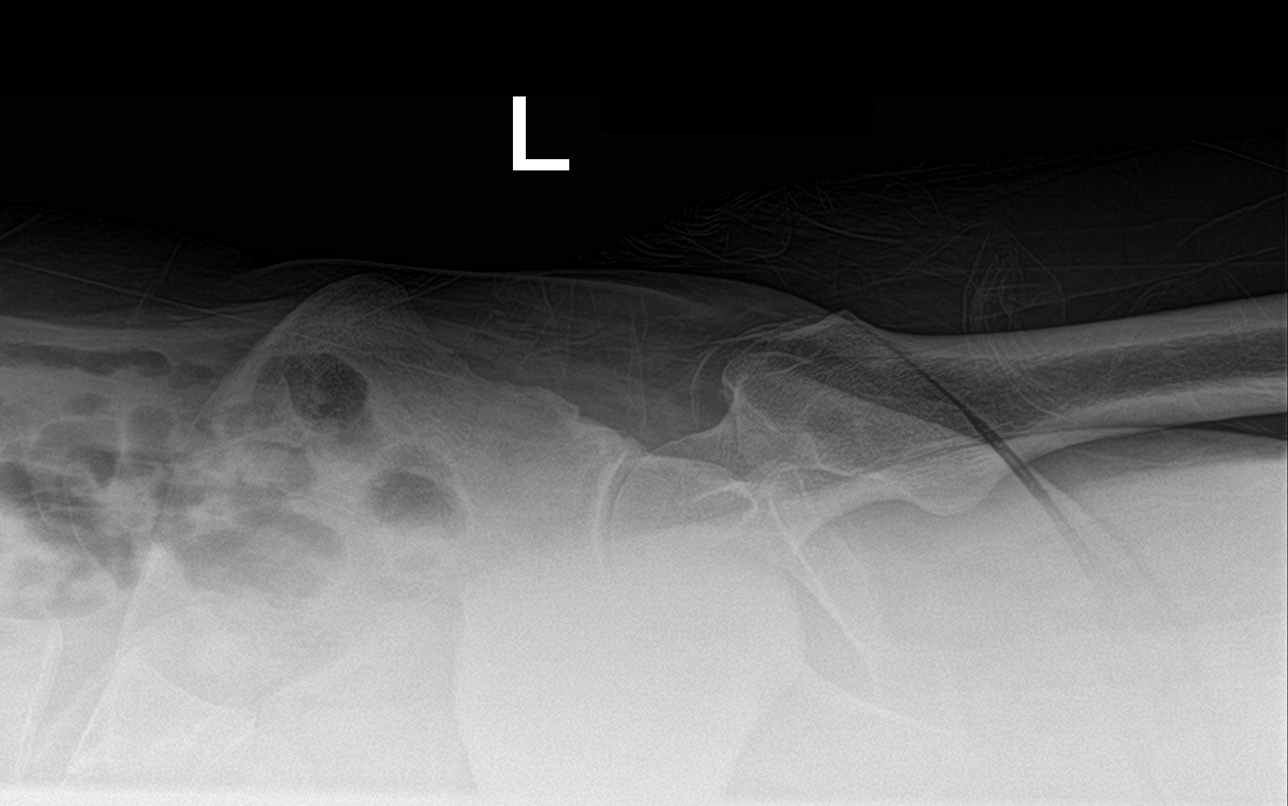

[femur lat (2 of 2)]
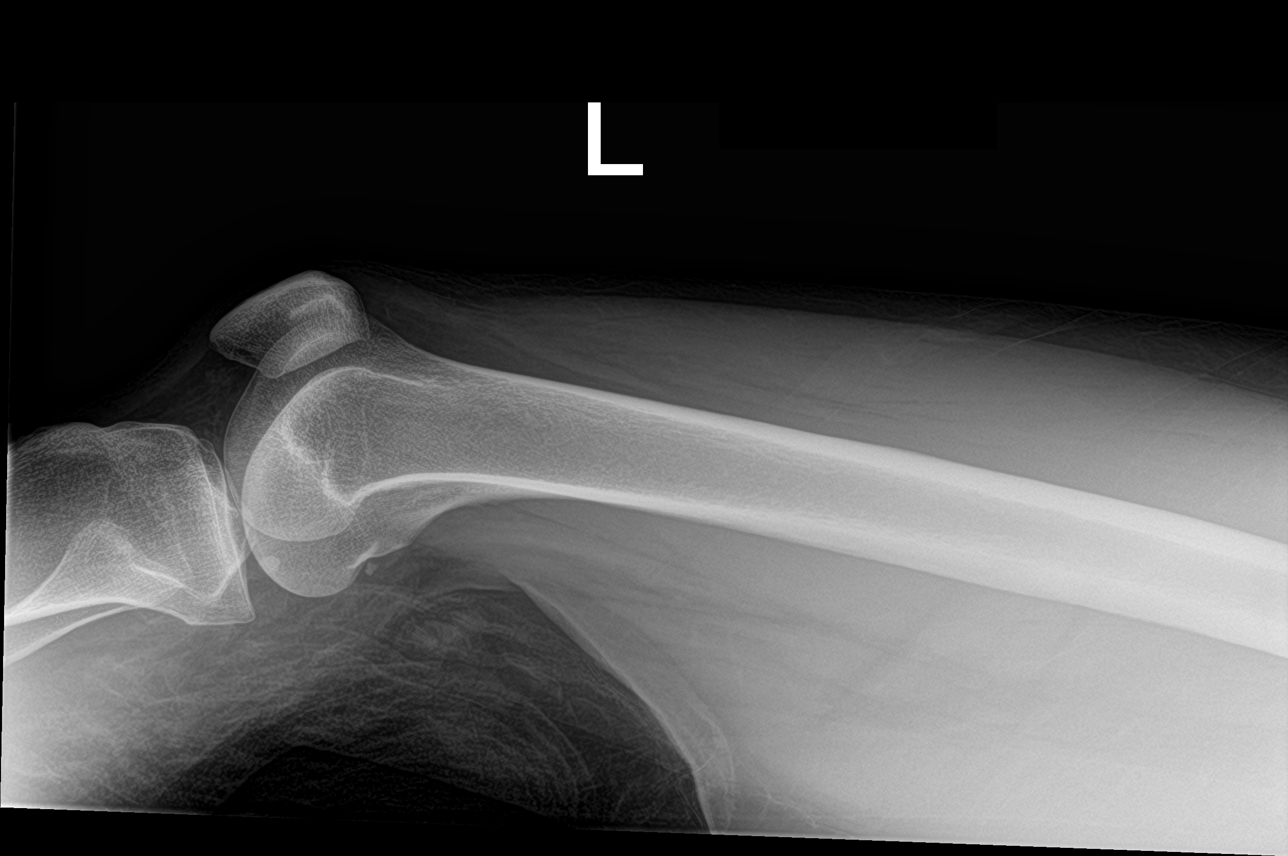

[4 of 4 positions shown; findings below may reference images not displayed]

FINDINGS: There is no evidence of fracture or other focal bone lesions. Soft
tissues are unremarkable.
IMPRESSION: Negative.

## 2020-01-16 MED ORDER — IOHEXOL 300 MG/ML  SOLN
100.0000 mL | Freq: Once | INTRAMUSCULAR | Status: AC | PRN
  Administered 2020-01-16: 100 mL via INTRAVENOUS

## 2020-01-16 MED ORDER — MORPHINE SULFATE (PF) 4 MG/ML IV SOLN
4.0000 mg | Freq: Once | INTRAVENOUS | Status: AC
  Administered 2020-01-16: 4 mg via INTRAVENOUS
  Filled 2020-01-16: qty 1

## 2020-01-16 NOTE — ED Notes (Signed)
Pt to CT

## 2020-01-16 NOTE — ED Notes (Signed)
Pt reports pain to left shoulder. Attempted to ambulate pt and pt unable to bear weight on left lower extremity. Staff assist x 2. Dr. Ralene Bathe notified. Pt is requesting to go home at this time.

## 2020-01-16 NOTE — ED Provider Notes (Signed)
Care assumed from Dr. Ralene Bathe at shift change with MRI hip and femur pending.   In brief, this patient is a 39 year old female who presents for evaluation after a fall.  Patient apparently fell down 6 stairs.  She has complaint of left leg pain complaint.   Physical Exam  BP 118/64 (BP Location: Right Arm)   Pulse 64   Temp 98.4 F (36.9 C) (Oral)   Resp 20   Ht 5\' 5"  (1.651 m)   Wt 72.6 kg   SpO2 95%   BMI 26.63 kg/m   Physical Exam  2+ DP pulses noted.   ED Course/Procedures     Procedures  MDM   PLAN: Patient has required multiple rounds of pain medication.  Scans were negative for any acute bony abnormality.  Patient was attempted to ambulate in her room but was unable to put weight on her leg.  Dr. Ayesha Rumpf discussed with Dr. Rolena Infante (Ortho) who recommended MRI of hip and femur.  MDM:  MRI of hip shows mildly increased signal within the internal auditory muscular, likely muscle strain. No acute fracture or marrow edema. Mild insertional hamstrings and gluteal tendinosis.  Discussed with patient. She feels like she can still not put weight on that leg though does feel like pain is somewhat improved.  Discussed patient with Dr. Rolena Infante (ortho) regarding patient's MRI. He will plan to evaluate the MRI and evaluate the patient and determine management.   Patient signed out to [employer], PA-C.   Updated patient on plan. She is agreeable.    1. Fall, initial encounter   2. Muscle strain     Portions of this note were generated with [employer]. Dictation errors may occur despite best attempts at proofreading.    Volanda Napoleon, PA-C 01/17/20 0029    Quintella Reichert, MD 01/17/20 1039

## 2020-01-16 NOTE — ED Provider Notes (Signed)
[facility] EMERGENCY DEPARTMENT Provider Note   CSN: [id number] Arrival date & time: 01/16/20  1517     History Chief Complaint  Patient presents with  . Fall    Nichole Green is a 39 y.o. female.  The history is provided by the patient, the EMS personnel and medical records.  Fall   Nichole Green is a 39 y.o. female who presents to the Emergency Department complaining of fall. Level V caveat due to confusion. History is provided by the patient. She presents the emergency department for evaluation of injuries following a fall. She was at home and fell down six tears. Is unclear why she fell down the stairs. She states that she was dizzy. She complains of severe pain to her left leg. She received 200 g of fentanyl IM prior to ED arrival.    Past Medical History:  Diagnosis Date  . Headache    only when B/P elevated   . Hypertension   . Infection    UTI  . Pregnancy induced hypertension     Patient Active Problem List   Diagnosis Date Noted  . Chronic hypertension 06/20/2016  . Postpartum care following vaginal delivery 06/20/2016  . GDM (gestational diabetes mellitus) 04/09/2016  . Uterine fibroid 02/09/2016  . Chronic hypertension during pregnancy, antepartum 10/31/2015    Past Surgical History:  Procedure Laterality Date  . BREAST REDUCTION SURGERY    . REDUCTION MAMMAPLASTY Bilateral 2010  . ROBOTIC ASSISTED LAPAROSCOPIC OVARIAN CYSTECTOMY N/A 01/17/2016   Procedure: XI ROBOTIC ASSISTED LAPAROSCOPIC OVARIAN CYSTECTOMY;  Surgeon: Everitt Amber, MD;  Location: [facility];  Service: Gynecology;  Laterality: N/A;     OB History    Gravida  3   Para  3   Term  2   Preterm  1   AB  0   Living  3     SAB  0   TAB  0   Ectopic  0   Multiple  0   Live Births  3        Obstetric Comments  Elevated BP with preg        Family History  Problem Relation Age of Onset  . Hypertension Mother   . Diabetes Mother     Social History    Tobacco Use  . Smoking status: Former Smoker    Years: 2.00    Types: Cigarettes    Quit date: 10/25/2015    Years since quitting: 4.2  . Smokeless tobacco: Never Used  . Tobacco comment: hookah  Vaping Use  . Vaping Use: Never used  Substance Use Topics  . Alcohol use: No  . Drug use: No    Home Medications Prior to Admission medications   Medication Sig Start Date End Date Taking? Authorizing Provider  amLODipine (NORVASC) 10 MG tablet Take 1 tablet (10 mg total) by mouth daily. Patient not taking: Reported on 08/09/2016 05/24/16   Kandis Cocking A, CNM  calcium-vitamin D (OSCAL WITH D) 250-125 MG-UNIT tablet Take 1 tablet by mouth daily.    [provider]  metroNIDAZOLE (FLAGYL) 500 MG tablet Take 1 tablet (500 mg total) by mouth 2 (two) times daily. Patient not taking: Reported on 06/11/2018 12/09/17   Constant, Peggy, MD  metroNIDAZOLE (METROGEL) 0.75 % vaginal gel Place 1 Applicatorful vaginally at bedtime. Apply one applicatorful to vagina at bedtime for 5 days Patient not taking: Reported on 06/11/2018 04/08/18   Shelly Bombard, MD  oxymetazoline (AFRIN) 0.05 % nasal  spray Place 1 spray into both nostrils 2 (two) times daily as needed for congestion.    [provider]    Allergies    Patient has no known allergies.  Review of Systems   Review of Systems  All other systems reviewed and are negative.   Physical Exam Updated Vital Signs BP (!) 148/92 (BP Location: Right Arm)   Pulse 74   Temp 97.7 F (36.5 C) (Oral)   Resp 20   Ht 5\' 5"  (1.651 m)   Wt 72.6 kg   SpO2 98%   BMI 26.63 kg/m   Physical Exam Vitals and nursing note reviewed.  Constitutional:      General: She is in acute distress.     Appearance: She is well-developed. She is ill-appearing.  HENT:     Head: Normocephalic and atraumatic.  Cardiovascular:     Rate and Rhythm: Normal rate and regular rhythm.     Heart sounds: No murmur heard.   Pulmonary:     Effort:  Pulmonary effort is normal. No respiratory distress.     Breath sounds: Normal breath sounds.  Abdominal:     Palpations: Abdomen is soft.     Tenderness: There is no abdominal tenderness. There is no guarding or rebound.  Musculoskeletal:        General: Tenderness present.     Comments: There is internal rotation and shoretening to the left lower extremity. There is tenderness to palpation throughout the left posterior upper leg. Patient is unable to range the left leg at all. She can wiggle her toes and dorsiflexion plantar flex at the ankle. On attempt to passively range the leg she has severe pain to her posterior thigh as well as mild pain in the calf. She is unable to externally rotated the hip. There is no significant tenderness to the knee, ankle, foot.  Skin:    General: Skin is warm and dry.  Neurological:     Mental Status: She is alert.     Comments: Mildly confused - received pain medications just prior to evaluation  Psychiatric:        Behavior: Behavior normal.     ED Results / Procedures / Treatments   Labs (all labs ordered are listed, but only abnormal results are displayed) Labs Reviewed  COMPREHENSIVE METABOLIC PANEL  CBC WITH DIFFERENTIAL/PLATELET  LIPASE, BLOOD  I-STAT CHEM 8, ED  I-STAT BETA HCG BLOOD, ED ([facility], [facility], [facility] ONLY)    EKG None  Radiology No results found.  Procedures Procedures (including critical care time)  Medications Ordered in ED Medications - No data to display  ED Course  I have reviewed the triage vital signs and the nursing notes.  Pertinent labs & imaging results that were available during my care of the patient were reviewed by me and considered in my medical decision making (see chart for details).    MDM Rules/Calculators/A&P                         patient here for evaluation of injuries following a fall downstairs. On initial assessment patient is lethargic, confused and in severe stress. Level II trauma alert called due  to mental status changes. On repeat assessment after metabolize nation of prehospital medications patient is awake and alert. She complains of severe pain to her left posterior thigh and buttocks. She has significant tenderness with palpation as well as passive range of motion. Imaging is negative for acute fracture.  She has no numbness on reassessment. Given severe pain out of proportion discussed the case with on-call orthopedic surgeon, Dr. Rolena Infante, who recommends MRI to further evaluate for labral tear or tendon injury. Patient care transferred pending MRI.  Final Clinical Impression(s) / ED Diagnoses Final diagnoses:  None    Rx / DC Orders ED Discharge Orders    None       Quintella Reichert, MD 01/16/20 2011

## 2020-01-16 NOTE — ED Triage Notes (Signed)
Pt arrived via [facility] from home. Pt had unwitnessed fall down 6 steps at home and c/o left leg pain, left shoulder pain and right forearm pain. Per EMS gave fentanyl 21mcg IM. Per EMS pt has left leg shortening and hematoma of right forearm. Per EMS, pt denies LOC. Pt is A&Ox4.

## 2020-01-17 DIAGNOSIS — M79605 Pain in left leg: Secondary | ICD-10-CM | POA: Diagnosis present

## 2020-01-17 MED ORDER — IBUPROFEN 600 MG PO TABS: 600.0000 mg | ORAL_TABLET | Freq: Four times a day (QID) | ORAL | 0 refills | Status: DC | PRN

## 2020-01-17 MED ORDER — MORPHINE SULFATE (PF) 4 MG/ML IV SOLN
4.0000 mg | Freq: Once | INTRAVENOUS | Status: AC
  Administered 2020-01-17: 4 mg via INTRAVENOUS
  Filled 2020-01-17: qty 1

## 2020-01-17 MED ORDER — LACTATED RINGERS IV BOLUS: 1000.0000 mL | Freq: Once | INTRAVENOUS | Status: DC

## 2020-01-17 MED ORDER — LACTATED RINGERS IV SOLN: INTRAVENOUS | Status: DC

## 2020-01-17 MED ORDER — HYDROCODONE-ACETAMINOPHEN 5-325 MG PO TABS
1.0000 | ORAL_TABLET | ORAL | 0 refills | Status: DC | PRN
Start: 2020-01-17 — End: 2021-01-08

## 2020-01-17 MED ORDER — CYCLOBENZAPRINE HCL 10 MG PO TABS: 10.0000 mg | ORAL_TABLET | Freq: Two times a day (BID) | ORAL | 0 refills | Status: DC | PRN

## 2020-01-17 NOTE — ED Notes (Signed)
Patient verbalizes understanding of discharge instructions. Opportunity for questioning and answers were provided. Armband removed by staff, pt discharged from ED.  

## 2020-01-17 NOTE — Discharge Instructions (Addendum)
Rest and take medications as prescribed. Do not attempt to get up and walk without help/assistance.   Apply cool compresses for the first 48 hours, then you can use heat for comfort.   Return to the ED with any new concern. You can expect to have an increased amount of soreness for the next 1-2 days before you see any improvement.   See your doctor for recheck if no better in one week.

## 2020-01-17 NOTE — Consult Note (Signed)
Chief Complaint: Status post fall with significant left hip pain. History: Nichole Green is a 39 y.o. female who presents to the Emergency Department complaining of fall. Level V caveat due to confusion. History is provided by the patient. She presents the emergency department for evaluation of injuries following a fall. She was at home and fell down six tears. Is unclear why she fell down the stairs. She states that she was dizzy. She complains of severe pain to her left leg. She received 200 g of fentanyl IM prior to ED arrival.   Past Medical History:  Diagnosis Date  . Headache    only when B/P elevated   . Hypertension   . Infection    UTI  . Pregnancy induced hypertension    Review of systems: Chronic hypertension Positive history of gastric reflux No dysuria or hematuria.  No incontinence of bowel or bladder.  No bright red blood per rectum. Positive history of headaches.  No Known Allergies  Current Facility-Administered Medications on File Prior to Encounter  Medication Dose Route Frequency Provider Last Rate Last Admin  . etonogestrel (NEXPLANON) implant 68 mg  68 mg Subdermal Once Constant, Peggy, MD       Current Outpatient Medications on File Prior to Encounter  Medication Sig Dispense Refill  . amoxicillin-clavulanate (AUGMENTIN) 875-125 MG tablet Take 1 tablet by mouth 2 (two) times daily.    . calcium-vitamin D (OSCAL WITH D) 250-125 MG-UNIT tablet Take 1 tablet by mouth daily.    Marland Kitchen oxymetazoline (AFRIN) 0.05 % nasal spray Place 1 spray into both nostrils 2 (two) times daily as needed for congestion.    . SUMAtriptan (IMITREX) 100 MG tablet Take 1 tablet by mouth every 12 (twelve) hours as needed.      Physical Exam: Vitals:   01/17/20 0000 01/17/20 0215  BP: 118/64   Pulse: 64 (!) 57  Resp: 20 12  Temp: 98.4 F (36.9 C)   SpO2: 95% 95%   Body mass index is 26.63 kg/m. She is alert and oriented x3 complaining of severe left anterior and lateral  thigh and hip pain. Lungs clear to auscultation bilaterally Cardiac: Regular rate and rhythm no rubs gallops murmurs Abdomen is soft and nontender with no rebound tenderness.  No incontinence of bowel and bladder She is neurologically intact.  No cognitive impairment.  No loss of consciousness. 5/5 EHL/tibialis anterior/gastrocnemius strength bilaterally.  Normal sensation to light touch throughout in the calf and foot.  Unable to perform straight leg raise due to horrific left groin pain.  Compartments in the lower extremity are soft and nontender.  No increased pain with passive range of motion of the foot.  2+ dorsalis pedis/posterior tibialis pulses bilaterally.  No significant swelling at the knee.  Right lower extremity has full range of motion of the hip knee and ankle with no significant pain crepitus or deformity.  Image: DG Pelvis 1-2 Views  Result Date: 01/16/2020 CLINICAL DATA:  Recent fall with pelvic pain, initial encounter EXAM: PELVIS - 1 VIEW COMPARISON:  None. FINDINGS: Pelvic ring is intact. No acute fracture or dislocation is noted. Mild degenerative changes of the hip joints are seen. No soft tissue abnormality is noted. IMPRESSION: Degenerative change without acute abnormality. Electronically Signed   By: Inez Catalina M.D.   On: 01/16/2020 16:29   CT Head Wo Contrast  Result Date: 01/16/2020 CLINICAL DATA:  39 year old with fall down steps. EXAM: CT HEAD WITHOUT CONTRAST TECHNIQUE: Contiguous axial images were  obtained from the base of the skull through the vertex without intravenous contrast. COMPARISON:  C-spine evaluation of the same date. FINDINGS: Brain: No evidence of acute infarction, hemorrhage, hydrocephalus, extra-axial collection or mass lesion/mass effect. Vascular: No hyperdense vessel or unexpected calcification. Skull: Normal. Negative for fracture or focal lesion. Sinuses/Orbits: No acute finding. Other: None. IMPRESSION: No acute intracranial abnormality.  Electronically Signed   By: Zetta Bills M.D.   On: 01/16/2020 16:58   CT Chest W Contrast  Result Date: 01/16/2020 CLINICAL DATA:  Fall downstairs with left hip pain, initial encounter EXAM: CT CHEST, ABDOMEN, AND PELVIS WITH CONTRAST TECHNIQUE: Multidetector CT imaging of the chest, abdomen and pelvis was performed following the standard protocol during bolus administration of intravenous contrast. CONTRAST:  136mL OMNIPAQUE IOHEXOL 300 MG/ML  SOLN COMPARISON:  Plain films from earlier in the same day. FINDINGS: CT CHEST FINDINGS Cardiovascular: Thoracic aorta and its branches are within normal limits. No cardiac enlargement is noted. The pulmonary artery as visualized is within normal limits. No coronary calcifications are seen. Mediastinum/Nodes: Thoracic inlet is within normal limits. No sizable hilar or mediastinal adenopathy is noted. No mediastinal hematoma is seen. The esophagus is within normal limits. Lungs/Pleura: Lungs are well aerated bilaterally. No focal infiltrate, effusion or pneumothorax is noted. Musculoskeletal: No acute bony abnormality is seen. CT ABDOMEN PELVIS FINDINGS Hepatobiliary: Gallbladder is within normal limits. Fatty liver is noted. Pancreas: Unremarkable. No pancreatic ductal dilatation or surrounding inflammatory changes. Spleen: Normal in size without focal abnormality. Adrenals/Urinary Tract: Adrenal glands are within normal limits. Kidneys are well visualized bilaterally. Bladder is within normal limits. No obstructive changes are seen. Stomach/Bowel: No obstructive or inflammatory changes of the colon are noted. 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 No small bowel abnormality is noted. Vascular/Lymphatic: No significant vascular findings are present. No enlarged abdominal or pelvic lymph nodes. Reproductive: Uterus is within normal limits. Few small cysts are noted within the left ovary. No adnexal mass is noted. Other: No abdominal wall hernia or abnormality. No  abdominopelvic ascites. Musculoskeletal: No acute or significant osseous findings. IMPRESSION: CT of the chest: No acute abnormality noted. CT of the abdomen and pelvis: Fatty liver. No acute posttraumatic abnormality is noted. Electronically Signed   By: Inez Catalina M.D.   On: 01/16/2020 17:05   CT Cervical Spine Wo Contrast  Result Date: 01/16/2020 CLINICAL DATA:  Fall, level 2 trauma.  Neck pain EXAM: CT CERVICAL SPINE WITHOUT CONTRAST TECHNIQUE: Multidetector CT imaging of the cervical spine was performed without intravenous contrast. Multiplanar CT image reconstructions were also generated. COMPARISON:  None. FINDINGS: Alignment: Facet joints are aligned without dislocation or traumatic listhesis. Dens and lateral masses are aligned. Skull base and vertebrae: No acute fracture. No primary bone lesion or focal pathologic process. Soft tissues and spinal canal: No prevertebral fluid or swelling. No visible canal hematoma. Disc levels: Intervertebral disc heights are well preserved. No significant facet arthropathy. Upper chest: Visualized lung apices are clear. Other: None. IMPRESSION: No acute fracture or traumatic listhesis of the cervical spine. Electronically Signed   By: Davina Poke D.O.   On: 01/16/2020 16:48   CT Abdomen Pelvis W Contrast  Result Date: 01/16/2020 CLINICAL DATA:  Fall downstairs with left hip pain, initial encounter EXAM: CT CHEST, ABDOMEN, AND PELVIS WITH CONTRAST TECHNIQUE: Multidetector CT imaging of the chest, abdomen and pelvis was performed following the standard protocol during bolus administration of intravenous contrast. CONTRAST:  134mL OMNIPAQUE IOHEXOL 300 MG/ML  SOLN COMPARISON:  Plain  films from earlier in the same day. FINDINGS: CT CHEST FINDINGS Cardiovascular: Thoracic aorta and its branches are within normal limits. No cardiac enlargement is noted. The pulmonary artery as visualized is within normal limits. No coronary calcifications are seen.  Mediastinum/Nodes: Thoracic inlet is within normal limits. No sizable hilar or mediastinal adenopathy is noted. No mediastinal hematoma is seen. The esophagus is within normal limits. Lungs/Pleura: Lungs are well aerated bilaterally. No focal infiltrate, effusion or pneumothorax is noted. Musculoskeletal: No acute bony abnormality is seen. CT ABDOMEN PELVIS FINDINGS Hepatobiliary: Gallbladder is within normal limits. Fatty liver is noted. Pancreas: Unremarkable. No pancreatic ductal dilatation or surrounding inflammatory changes. Spleen: Normal in size without focal abnormality. Adrenals/Urinary Tract: Adrenal glands are within normal limits. Kidneys are well visualized bilaterally. Bladder is within normal limits. No obstructive changes are seen. Stomach/Bowel: No obstructive or inflammatory changes of the colon are noted. 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 No small bowel abnormality is noted. Vascular/Lymphatic: No significant vascular findings are present. No enlarged abdominal or pelvic lymph nodes. Reproductive: Uterus is within normal limits. Few small cysts are noted within the left ovary. No adnexal mass is noted. Other: No abdominal wall hernia or abnormality. No abdominopelvic ascites. Musculoskeletal: No acute or significant osseous findings. IMPRESSION: CT of the chest: No acute abnormality noted. CT of the abdomen and pelvis: Fatty liver. No acute posttraumatic abnormality is noted. Electronically Signed   By: Inez Catalina M.D.   On: 01/16/2020 17:05   MR FEMUR LEFT WO CONTRAST  Result Date: 01/17/2020 CLINICAL DATA:  Fall, pain EXAM: MR OF THE LEFT HIP and femur WITHOUT CONTRAST TECHNIQUE: Multiplanar, multisequence MR imaging was performed. No intravenous contrast was administered. COMPARISON:  None. FINDINGS: Bones/Joint/Cartilage Normal osseous marrow signal seen throughout. No osseous fracture or marrow edema. No areas of cortical destruction or periosteal reaction. The articular  surfaces are well maintained. Ligaments The ligamentum teres is intact. Muscles and Tendons Mildly increased signal seen within the obturator internus musculature. The remainder of the muscles surrounding the hip and femur are normal appearance without focal atrophy or edema. Minimally increased signal seen at the hamstrings and gluteal tendon insertion site. The iliopsoas tendon is intact. Soft tissue No focal soft tissue swelling or soft tissue mass is seen. The visualized portion of the deep pelvis is grossly unremarkable. A small amount of physiologic free fluid seen within the deep cul-de-sac. IMPRESSION: Mildly increased signal within the internal obturator musculature, likely mild muscular strain. No osseous fracture or marrow edema. Mild insertional hamstrings and gluteal tendinosis Electronically Signed   By: Prudencio Pair M.D.   On: 01/17/2020 00:00   MR HIP LEFT WO CONTRAST  Result Date: 01/17/2020 CLINICAL DATA:  Fall, pain EXAM: MR OF THE LEFT HIP and femur WITHOUT CONTRAST TECHNIQUE: Multiplanar, multisequence MR imaging was performed. No intravenous contrast was administered. COMPARISON:  None. FINDINGS: Bones/Joint/Cartilage Normal osseous marrow signal seen throughout. No osseous fracture or marrow edema. No areas of cortical destruction or periosteal reaction. The articular surfaces are well maintained. Ligaments The ligamentum teres is intact. Muscles and Tendons Mildly increased signal seen within the obturator internus musculature. The remainder of the muscles surrounding the hip and femur are normal appearance without focal atrophy or edema. Minimally increased signal seen at the hamstrings and gluteal tendon insertion site. The iliopsoas tendon is intact. Soft tissue No focal soft tissue swelling or soft tissue mass is seen. The visualized portion of the deep pelvis is grossly unremarkable. A small amount of  physiologic free fluid seen within the deep cul-de-sac. IMPRESSION: Mildly  increased signal within the internal obturator musculature, likely mild muscular strain. No osseous fracture or marrow edema. Mild insertional hamstrings and gluteal tendinosis Electronically Signed   By: Prudencio Pair M.D.   On: 01/17/2020 00:00   DG Chest Port 1 View  Result Date: 01/16/2020 CLINICAL DATA:  Fall with chest pain, initial encounter EXAM: PORTABLE CHEST 1 VIEW COMPARISON:  04/12/2018 FINDINGS: Cardiac shadow is within normal limits. Lungs are well aerated bilaterally. No focal infiltrate or sizable effusion is seen. No bony abnormality is noted. IMPRESSION: No acute abnormality noted. Electronically Signed   By: Inez Catalina M.D.   On: 01/16/2020 16:29   DG Knee Complete 4 Views Left  Result Date: 01/16/2020 CLINICAL DATA:  Fall EXAM: LEFT KNEE - COMPLETE 4+ VIEW COMPARISON:  None. FINDINGS: No evidence of fracture, dislocation, or joint effusion. No evidence of arthropathy or other focal bone abnormality. Soft tissues are unremarkable. IMPRESSION: Negative. Electronically Signed   By: Prudencio Pair M.D.   On: 01/16/2020 19:24   DG Femur Min 2 Views Left  Result Date: 01/16/2020 CLINICAL DATA:  Fall EXAM: LEFT FEMUR 2 VIEWS COMPARISON:  None. FINDINGS: There is no evidence of fracture or other focal bone lesions. Soft tissues are unremarkable. IMPRESSION: Negative. Electronically Signed   By: Prudencio Pair M.D.   On: 01/16/2020 19:25    A/P: Nichole Green is a very pleasant 39 year old woman who was in her usual state of good health until this evening when she fell down half a flight of stairs.  She was brought to the emergency room unable to ambulate with severe left leg pain.  Patient localizes her pain to the hip and thigh.  X-rays, and MRI of the pelvis and femur failed to demonstrate any evidence of fracture, avascular necrosis, or labral injury.  There is no evidence of any significant muscular tear or avulsion injury.  Patient did have some mildly increased signal in the obturator  muscle most likely consistent with a strain.  Her clinical exam demonstrates no evidence of any focal neurological deficits or radiculopathy.  At this point I cannot explain the severe pain that she has.  Given the fact that she cannot ambulate she has horrific pain requiring multiple rounds of narcotics I would recommend admission to the medical service for pain medical management.  She can also receive physical therapy for mobilization.  At this point there is no indication for any surgical intervention or further urgent orthopedic intervention.  She can follow-up with my partner Dr. Gladstone Lighter after discharge for further evaluation and treatment.

## 2020-01-17 NOTE — Progress Notes (Signed)
Orthopedic Tech Progress Note Patient Details:  Nichole Green 1980/04/14 [id number]  Ortho Devices Type of Ortho Device: Crutches Ortho Device/Splint Location: given to pt at drs request Ortho Device/Splint Interventions: Ordered, Application, Adjustment   Post Interventions Patient Tolerated: Fair Instructions Provided: Care of device, Adjustment of device   Karolee Stamps 01/17/2020, 7:21 AM

## 2020-01-17 NOTE — ED Provider Notes (Addendum)
Fell down stairs, left hip Abnormal MRI Dr. Rolena Infante to see in the ED  The patient has been seen and evaluated by Dr. Rolena Infante, orthopedics, who does not identify any needed surgical intervention. Recommends PT eval and pain management.   The patient has required multiple doses of pain medications in the ED and still cannot ambulate due to pain. Discussed with dr. Cyd Silence, [facility], who will admit for pain control and PT evaluation.   Charlann Lange, PA-C 01/17/20 1287  ADDENDUM: 5:15 - patient declines admission. She requests attempt to use crutches for walking/mobility at home.   On attempt, she is able to bring herself to sitting position. She is brought to standing stabilizing with the crutches. She still cannot bear weight on the left leg. The leg is re-examined and no bruising or bony deformity is visualized. No palpable hematoma. She is able to bring herself back to bedside and go to a safe position of sitting. She denies dizziness.   She does not feel she can use the crutches safely, however, states she clearly states she still wants to go home and her husband will assist her with ADL's.   Will provide pain management at home.     Charlann Lange, PA-C 01/17/20 Vern Claude    Quintella Reichert, MD 01/17/20 1039

## 2020-01-21 ENCOUNTER — Other Ambulatory Visit: Payer: Self-pay | Admitting: *Deleted

## 2020-01-21 DIAGNOSIS — R002 Palpitations: Secondary | ICD-10-CM

## 2020-01-22 ENCOUNTER — Telehealth: Payer: Self-pay

## 2020-01-22 NOTE — Telephone Encounter (Signed)
Patient was trying to speak with who called her to document the correct address for her heart monitor to be sent. No documentation, no pcp listed. Correct address is [address] [place], [place] [address]

## 2020-01-26 ENCOUNTER — Other Ambulatory Visit (HOSPITAL_COMMUNITY)
Admission: RE | Admit: 2020-01-26 | Discharge: 2020-01-26 | Disposition: A | Discharge status: Home / Self Care | Payer: Medicaid Other | Source: Ambulatory Visit | Attending: Obstetrics and Gynecology | Admitting: Obstetrics and Gynecology

## 2020-01-26 ENCOUNTER — Ambulatory Visit (INDEPENDENT_AMBULATORY_CARE_PROVIDER_SITE_OTHER): Payer: Medicaid Other

## 2020-01-26 ENCOUNTER — Other Ambulatory Visit: Payer: Self-pay

## 2020-01-26 DIAGNOSIS — N898 Other specified noninflammatory disorders of vagina: Secondary | ICD-10-CM

## 2020-01-26 NOTE — Progress Notes (Signed)
Patient was assessed and managed by nursing staff during this encounter. I have reviewed the chart and agree with the documentation and plan. I have also made any necessary editorial changes.  Griffin Basil, MD 01/26/2020 3:37 PM

## 2020-01-26 NOTE — Progress Notes (Signed)
SUBJECTIVE:  39 y.o. female complains of white vaginal discharge. Denies abnormal vaginal bleeding or significant pelvic pain or fever. No UTI symptoms. Denies history of known exposure to STD.  OBJECTIVE:  She appears well, afebrile. Urine dipstick: not done.  ASSESSMENT:  Vaginal Discharge  Vaginal Odor   PLAN:  GC, chlamydia, trichomonas, BVAG, CVAG probe sent to lab. Treatment: To be determined once lab results are received ROV prn if symptoms persist or worsen.

## 2020-01-27 LAB — CERVICOVAGINAL ANCILLARY ONLY
Bacterial Vaginitis (gardnerella): NEGATIVE
Candida Glabrata: NEGATIVE
Candida Vaginitis: POSITIVE — AB
Chlamydia: NEGATIVE
Comment: NEGATIVE
Comment: NEGATIVE
Comment: NEGATIVE
Comment: NEGATIVE
Comment: NEGATIVE
Comment: NORMAL
Neisseria Gonorrhea: NEGATIVE
Trichomonas: NEGATIVE

## 2020-01-28 ENCOUNTER — Other Ambulatory Visit: Payer: Self-pay

## 2020-01-28 DIAGNOSIS — B379 Candidiasis, unspecified: Secondary | ICD-10-CM

## 2020-01-28 MED ORDER — FLUCONAZOLE 150 MG PO TABS: 150.0000 mg | ORAL_TABLET | Freq: Once | ORAL | 0 refills | Status: DC

## 2020-02-05 ENCOUNTER — Ambulatory Visit (INDEPENDENT_AMBULATORY_CARE_PROVIDER_SITE_OTHER): Payer: Medicaid Other

## 2020-02-05 DIAGNOSIS — R002 Palpitations: Secondary | ICD-10-CM | POA: Diagnosis not present

## 2020-03-17 ENCOUNTER — Other Ambulatory Visit: Payer: Self-pay

## 2020-03-17 ENCOUNTER — Other Ambulatory Visit: Payer: Self-pay | Admitting: Internal Medicine

## 2020-03-17 ENCOUNTER — Ambulatory Visit
Admission: RE | Admit: 2020-03-17 | Discharge: 2020-03-17 | Disposition: A | Discharge status: Home / Self Care | Payer: Medicaid Other | Source: Ambulatory Visit

## 2020-03-17 DIAGNOSIS — Z1231 Encounter for screening mammogram for malignant neoplasm of breast: Secondary | ICD-10-CM

## 2020-03-17 IMAGING — MG DIGITAL SCREENING BILAT W/ TOMO W/ CAD
8 series · 8 of 24 positions shown · non-contrast
Comparison: Previous exam(s).

CLINICAL DATA: Screening.

EXAM:
DIGITAL SCREENING BILATERAL MAMMOGRAM WITH TOMO AND CAD

[R MLO synth-2D]
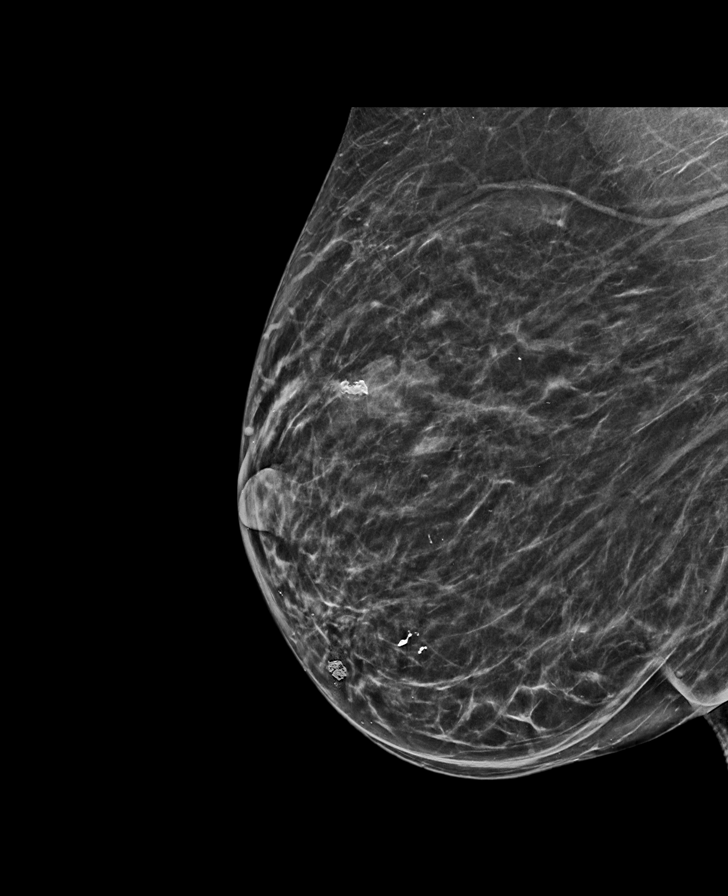

[L CC synth-2D]
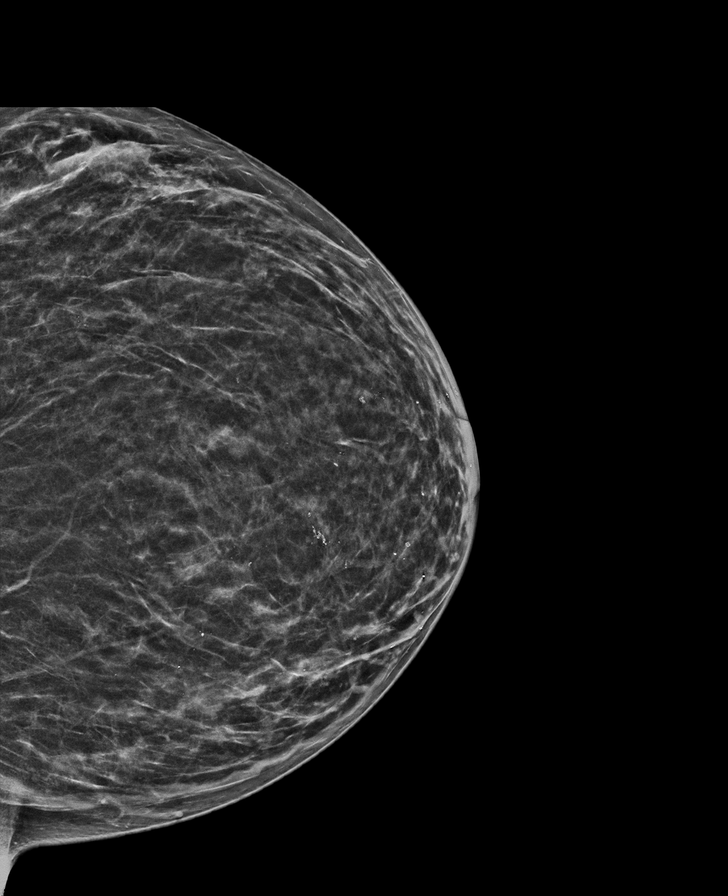

[L MLO synth-2D]
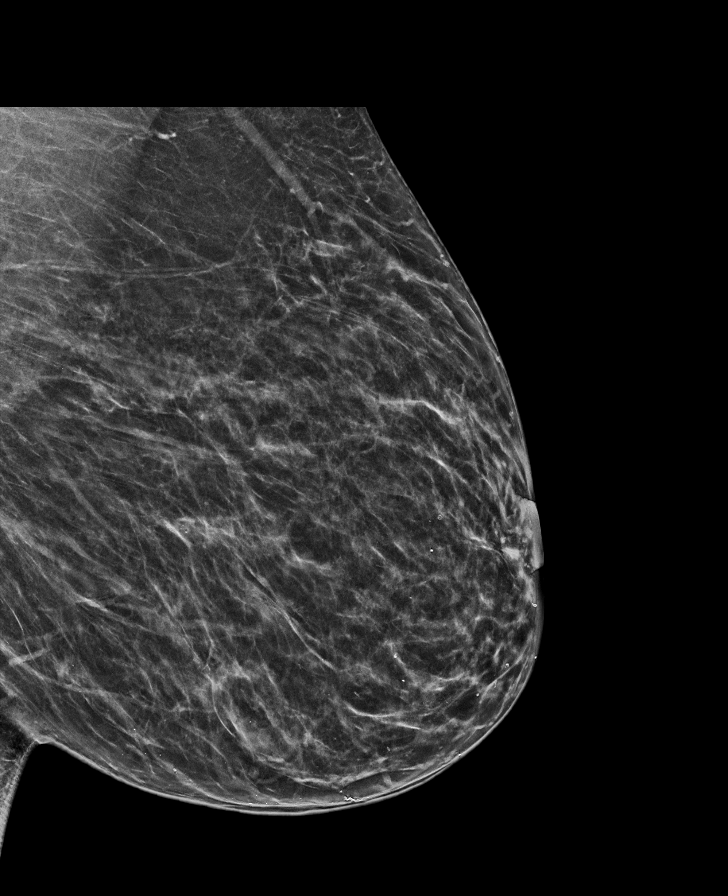

[R CC synth-2D]
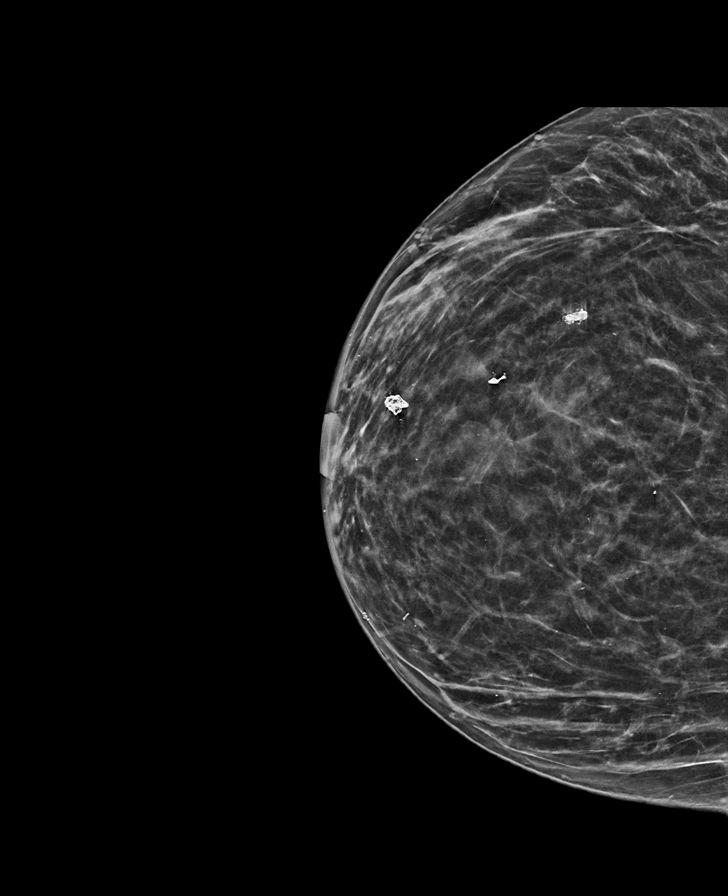

[L CC tomo · tomo slice 29/57.0]
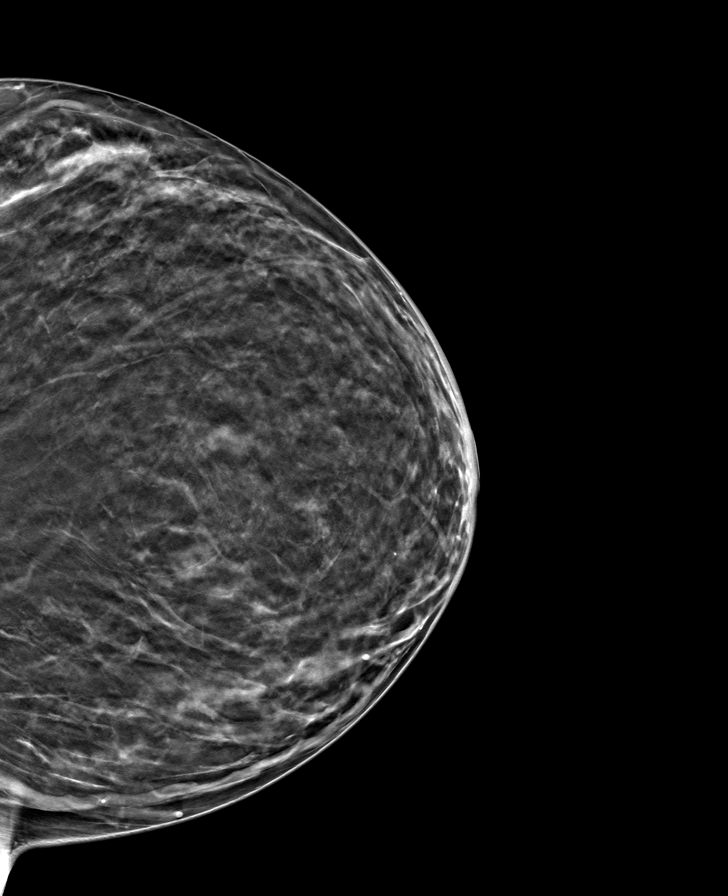

[R MLO tomo · tomo slice 35/68.0]
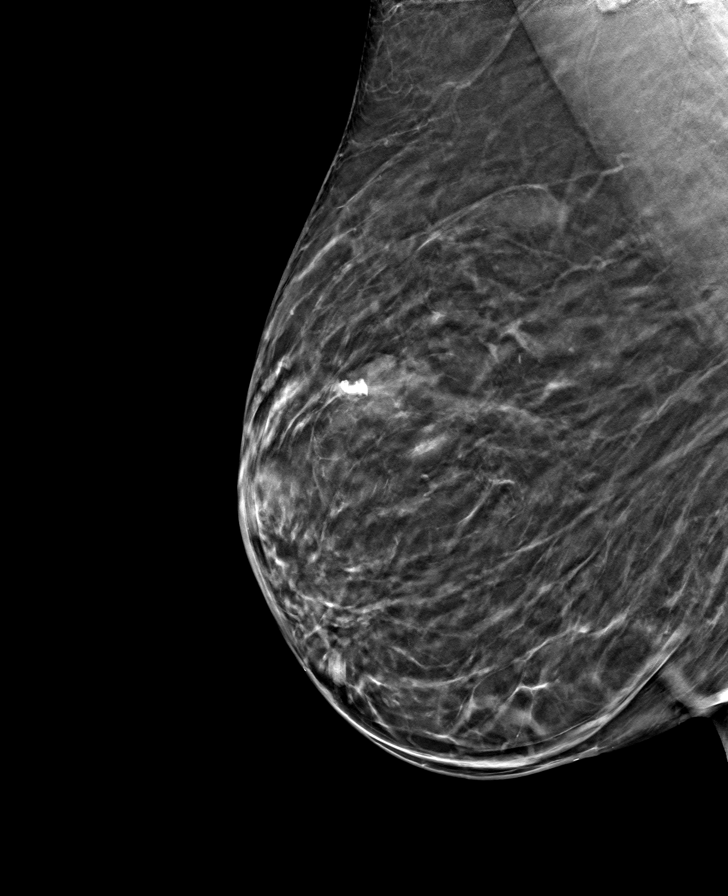

[L MLO tomo · tomo slice 34/67.0]
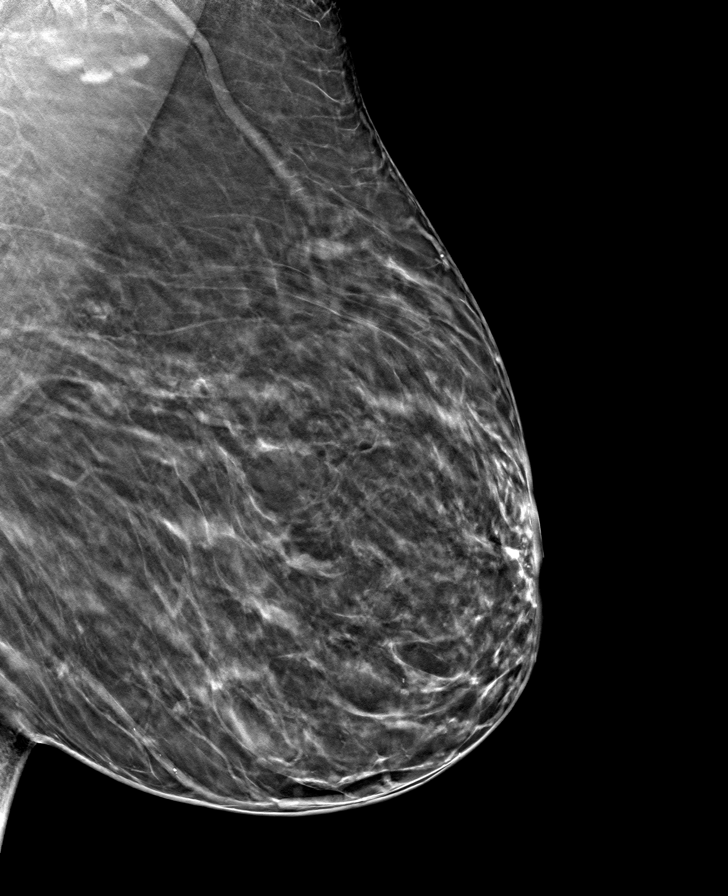

[R CC tomo · tomo slice 29/58.0]
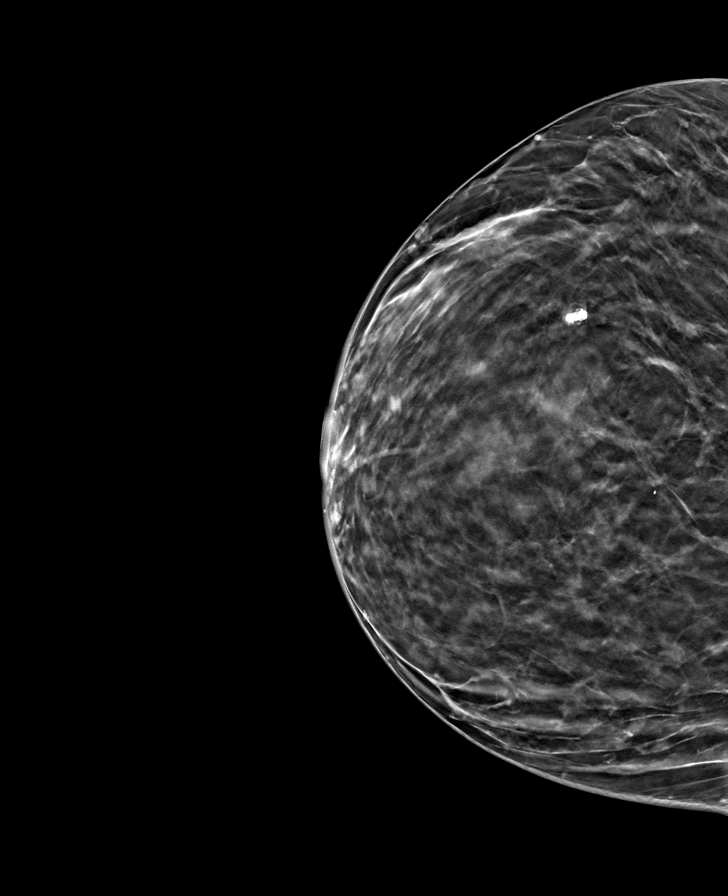

[8 of 24 positions shown; findings below may reference images not displayed]

ACR Breast Density Category b: There are scattered areas of
fibroglandular density.
FINDINGS: There are no findings suspicious for malignancy. Images were
processed with CAD.
IMPRESSION: No mammographic evidence of malignancy. A result letter of this
screening mammogram will be mailed directly to the patient.

RECOMMENDATION:
Screening mammogram in one year. (Code:[TQ])

BI-RADS CATEGORY  1: Negative.

## 2020-03-23 ENCOUNTER — Other Ambulatory Visit: Payer: Self-pay | Admitting: Obstetrics and Gynecology

## 2020-03-23 DIAGNOSIS — B379 Candidiasis, unspecified: Secondary | ICD-10-CM

## 2020-06-15 ENCOUNTER — Other Ambulatory Visit: Payer: Self-pay

## 2020-06-15 ENCOUNTER — Encounter: Payer: Self-pay | Admitting: Obstetrics and Gynecology

## 2020-06-15 ENCOUNTER — Ambulatory Visit (INDEPENDENT_AMBULATORY_CARE_PROVIDER_SITE_OTHER): Payer: Medicaid Other | Admitting: Obstetrics and Gynecology

## 2020-06-15 VITALS — BP 122/85 | HR 72 | Ht 63.0 in | Wt 178.0 lb

## 2020-06-15 DIAGNOSIS — D259 Leiomyoma of uterus, unspecified: Secondary | ICD-10-CM | POA: Diagnosis not present

## 2020-06-15 DIAGNOSIS — N920 Excessive and frequent menstruation with regular cycle: Secondary | ICD-10-CM | POA: Diagnosis not present

## 2020-06-15 NOTE — Patient Instructions (Signed)
Endometrial Biopsy  An endometrial biopsy is a procedure to remove tissue samples from the endometrium, which is the lining of the uterus. The tissue that is removed can then be checked under a microscope for disease. This procedure is used to diagnose conditions such as endometrial cancer, endometrial tuberculosis, polyps, or other inflammatory conditions. This procedure may also be used to investigate uterine bleeding to determine where you are in your menstrual cycle or how your hormone levels are affecting the lining of the uterus. Tell a health care provider about:  Any allergies you have.  All medicines you are taking, including vitamins, herbs, eye drops, creams, and over-the-counter medicines.  Any problems you or family members have had with anesthetic medicines.  Any blood disorders you have.  Any surgeries you have had.  Any medical conditions you have.  Whether you are pregnant or may be pregnant. What are the risks? Generally, this is a safe procedure. However, problems may occur, including:  Bleeding.  Pelvic infection.  Puncture of the wall of the uterus with the biopsy device (rare).  Allergic reactions to medicines. What happens before the procedure?  Keep a record of your menstrual cycles as told by your health care provider. You may need to schedule your procedure for a specific time in your cycle.  You may want to bring a sanitary pad to wear after the procedure.  Plan to have someone take you home from the hospital or clinic.  Ask your health care provider about: ? Changing or stopping your regular medicines. This is especially important if you are taking diabetes medicines, arthritis medicines, or blood thinners. ? Taking medicines such as aspirin and ibuprofen. These medicines can thin your blood. Do not take these medicines unless your health care provider tells you to take them. ? Taking over-the-counter medicines, vitamins, herbs, and  supplements. What happens during the procedure?  You will lie on an exam table with your feet and legs supported as in a pelvic exam.  Your health care provider will insert an instrument (speculum) into your vagina to see your cervix.  Your cervix will be cleansed with an antiseptic solution.  A medicine (local anesthetic) will be used to numb the cervix.  A forceps instrument (tenaculum) will be used to hold your cervix steady for the biopsy.  A thin, rod-like instrument (uterine sound) will be inserted through your cervix to determine the length of your uterus and the location where the biopsy sample will be removed.  A thin, flexible tube (catheter) will be inserted through your cervix and into the uterus. The catheter will be used to collect the biopsy sample from your endometrial tissue.  The catheter and speculum will then be removed, and the tissue sample will be sent to a lab for examination. The procedure may vary among health care providers and hospitals. What can I expect after procedure?  You will rest in a recovery area until you are ready to go home.  You may have mild cramping and a small amount of vaginal bleeding. This is normal.  You may have a small amount of vaginal bleeding for a few days. This is normal.  It is up to you to get the results of your procedure. Ask your health care provider, or the department that is doing the procedure, when your results will be ready. Follow these instructions at home:  Take over-the-counter and prescription medicines only as told by your health care provider.  Do not douche, use tampons, or have   sexual intercourse until your health care provider approves.  Return to your normal activities as told by your health care provider. Ask your health care provider what activities are safe for you.  Follow instructions from your health care provider about any activity restrictions, such as restrictions on strenuous exercise or heavy  lifting.  Keep all follow-up visits. This is important. Contact a health care provider:  You have heavy bleeding, or bleed for longer than 2 days after the procedure.  You have bad smelling discharge from your vagina.  You have a fever or chills.  You have a burning sensation when urinating or you have difficulty urinating.  You have severe pain in your lower abdomen. Get help right away if you:  You have severe cramps in your stomach or back.  You pass large blood clots.  Your bleeding increases.  You become weak or light-headed, or you faint or lose consciousness. Summary  An endometrial biopsy is a procedure to remove tissue samples is taken from the endometrium, which is the lining of the uterus.  The tissue sample that is removed will be checked under a microscope for disease.  This procedure is used to diagnose conditions such as endometrial cancer, endometrial tuberculosis, polyps, or other inflammatory conditions.  After the procedure, it is common to have mild cramping and a small amount of vaginal bleeding for a few days.  Do not douche, use tampons, or have sexual intercourse until your health care provider approves. Ask your health care provider which activities are safe for you. This information is not intended to replace advice given to you by your health care provider. Make sure you discuss any questions you have with your health care provider. Document Revised: 10/05/2019 Document Reviewed: 10/05/2019 Elsevier Patient Education  2021 [employer]. Uterine Fibroids  Uterine fibroids, also called leiomyomas, are noncancerous (benign) tumors that can grow in the uterus. They can cause heavy menstrual bleeding and pain. Fibroids may also grow in the fallopian tubes, cervix, or tissues (ligaments) near the uterus. You may have one or many fibroids. Fibroids vary in size, weight, and where they grow in the uterus. Some can become quite large. Most fibroids do not  require medical treatment. What are the causes? The cause of this condition is not known. What increases the risk? You are more likely to develop this condition if you:  Are in your 30s or 40s and have not gone through menopause.  Have a family history of this condition.  Are of African American descent.  Started your menstrual period at age 67 or younger.  Have never given birth.  Are overweight or obese. What are the signs or symptoms? Many women do not have any symptoms. Symptoms of this condition may include:  Heavy menstrual bleeding.  Bleeding between menstrual periods.  Pain and pressure in the pelvic area, between your hip bones.  Pain during sex.  Bladder problems, such as needing to urinate right away or more often than usual.  Inability to have children (infertility).  Failure to carry pregnancy to term (miscarriage). How is this diagnosed? This condition may be diagnosed based on:  Your symptoms and medical history.  A physical exam.  A pelvic exam that includes feeling for any tumors.  Imaging tests, such as ultrasound or MRI. How is this treated? Treatment for this condition may include follow-up visits with your health care provider to monitor your fibroids for any changes. Other treatment may include:  Medicines, such as: ? Medicines to  relieve pain, including aspirin and NSAIDs, such as ibuprofen or naproxen. ? Hormone therapy. Treatment may be given as a pill or an injection, or it may be inserted into the uterus using an intrauterine device (IUD).  Surgery that would do one of the following: ? Remove the fibroids (myomectomy). This may be recommended if fibroids affect your fertility and you want to become pregnant. ? Remove the uterus (hysterectomy). ? Block the blood supply to the fibroids (uterine artery embolization). This can cause them to shrink and die. Follow these instructions at home: Medicines  Take over-the-counter and  prescription medicines only as told by your health care provider.  Ask your health care provider if you should take iron pills or eat more iron-rich foods, such as dark green, leafy vegetables. Heavy menstrual bleeding can cause low iron levels. Managing pain If directed, apply heat to your back or abdomen to reduce pain. Use the heat source that your health care provider recommends, such as a moist heat pack or a heating pad. To apply heat:  Place a towel between your skin and the heat source.  Leave the heat on for 20-30 minutes.  Remove the heat if your skin turns bright red. This is especially important if you are unable to feel pain, heat, or cold. You may have a greater risk of getting burned.   General instructions  Pay close attention to your menstrual cycle. Tell your health care provider about any changes, such as: ? Heavier bleeding that requires you to change your pads or tampons more than usual. ? A change in the number of days that your menstrual period lasts. ? A change in symptoms that come with your menstrual period, such as back pain or cramps in your abdomen.  Keep all follow-up visits. This is important, especially if your fibroids need to be monitored for any changes. Contact a health care provider if you:  Have pelvic pain, back pain, or cramps in your abdomen that do not get better with medicine or heat.  Develop new bleeding between menstrual periods.  Have increased bleeding during or between menstrual periods.  Feel more tired or weak than usual.  Feel light-headed. Get help right away if you:  Faint.  Have pelvic pain that suddenly gets worse.  Have severe vaginal bleeding that soaks a tampon or pad in 30 minutes or less. Summary  Uterine fibroids are noncancerous (benign) tumors that can develop in the uterus.  The exact cause of this condition is not known.  Most fibroids do not require medical treatment unless they affect your ability to have  children (fertility).  Contact a health care provider if you have pelvic pain, back pain, or cramps in your abdomen that do not get better with medicines.  Get help right away if you faint, have pelvic pain that suddenly gets worse, or have severe vaginal bleeding. This information is not intended to replace advice given to you by your health care provider. Make sure you discuss any questions you have with your health care provider. Document Revised: 10/13/2019 Document Reviewed: 10/13/2019 Elsevier Patient Education  2021 [employer]. Menorrhagia Menorrhagia is when your monthly periods are heavy or last longer than normal. If you have this condition, bleeding and cramping may make it hard for you to do your daily activities. What are the causes? Common causes of this condition include:  Growths in the womb (uterus). These are polyps or fibroids. These growths are not cancer.  Problems with two hormones called  estrogen and progesterone.  One of the ovaries not releasing an egg during one or more months.  A problem with the thyroid gland.  Having a device for birth control (IUD).  Side effects of some medicines, such as NSAIDs or blood thinners.  A disorder that stops the blood from clotting normally. What increases the risk? You are more likely to have this condition if you have cancer of the womb. What are the signs or symptoms?  Having to change your pad or tampon every 1-2 hours because it is soaked.  Needing to use pads and tampons at the same time because of heavy bleeding.  Needing to wake up to change your pads or tampons during the night.  Passing blood clots larger than 1 inch (2.5 cm) in size.  Having bleeding that lasts for more than 7 days.  Having symptoms of low iron levels (anemia), such as feeling tired or having shortness of breath. How is this treated? You may not need to be treated for this condition. But if you need treatment, you may be given  medicines:  To reduce bleeding during your period. These include birth control medicines.  To make your blood thick. This slows bleeding.  To reduce swelling. Medicines that do this include ibuprofen.  That have a hormone called progestin.  That make the ovaries stop working for a short time.  To treat low iron levels. You will be given iron pills if you have this condition. If medicines do not work, surgery may be done. Surgery may be done to:  Remove a part of the lining of the womb. This lining is called the endometrium. This reduces bleeding during a period.  Remove growths in the womb. These may be polyps or fibroids.  Remove the entire lining of the womb.  Remove the womb entirely. This procedure is called a hysterectomy.   Follow these instructions at home: Medicines  Take over-the-counter and prescription medicines only as told by your doctor. This includes iron pills.  Do not change or switch medicines without asking your doctor.  Do not take aspirin or medicines that contain aspirin 1 week before or during your period. Aspirin may make bleeding worse. Managing constipation Iron pills may cause trouble pooping (constipation). To prevent or treat problems when pooping, you may need to:  Drink enough fluid to keep your pee (urine) pale yellow.  Take over-the-counter or prescription medicines.  Eat foods that are high in fiber. These include beans, whole grains, and fresh fruits and vegetables.  Limit foods that are high in fat and sugar. These include fried or sweet foods. General instructions  If you need to change your pad or tampon more than once every 2 hours, limit your activity until the bleeding stops.  Eat healthy meals and foods that are high in iron. Foods that have a lot of iron include: ? Leafy green vegetables. ? Meat. ? Liver. ? Eggs. ? Whole-grain breads and cereals.  Do not try to lose weight until your heavy bleeding has stopped and you have  normal amounts of iron in your blood. If you need to lose weight, work with your doctor.  Keep all follow-up visits. Contact a doctor if:  You soak through a pad or tampon every 1 or 2 hours, and this happens every time you have a period.  You need to use pads and tampons at the same time because you are bleeding so much.  You are taking medicine, and: ? You feel like  you may vomit. ? You vomit. ? You have watery poop (diarrhea).  You have other problems that may be related to the medicine you are taking. Get help right away if:  You soak through more than a pad or tampon in 1 hour.  You pass clots bigger than 1 inch (2.5 cm) wide.  You feel short of breath.  You feel like your heart is beating too fast.  You feel dizzy or you faint.  You feel very weak or tired. Summary  Menorrhagia is when your menstrual periods are heavy or last longer than normal.  You may not need to be treated for this condition. If you need treatment, you may be given medicines or have surgery.  Take over-the-counter and prescription medicines only as told by your doctor. This includes iron pills.  Get help right away if you soak through more than a pad or tampon in 1 hour or you pass large clots. Also, get help right away if you feel dizzy, short of breath, or very weak or tired. This information is not intended to replace advice given to you by your health care provider. Make sure you discuss any questions you have with your health care provider. Document Revised: 11/24/2019 Document Reviewed: 11/24/2019 Elsevier Patient Education  2021 [employer].

## 2020-06-15 NOTE — Progress Notes (Signed)
CC: heavy bleeding Subjective:    Patient ID: Nichole Green, female    DOB: November 18, 1980, 40 y.o.   MRN: [id number]  HPI 40 yo G3P3 seen at [facility] clinic presents for evaluation of heavy vaginal bleeding.  She notes she has had this heavier bleeding x 2 years.The menses are regular, but can last for 10-15 days.She notes increased pain during menses along with increased nausea and vomiting.  She states she "just stays in bed."  The patient does not smoke.  The patient was unsure if she had a previous diagnosis of fibroids.  The patient currently has a nexplanon device.  Of note she states she had similar bleeding with a previous nexplanon device.  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  This procedure is not listed in her surgical history.   Review of Systems  Constitutional: Negative.   Respiratory: Negative.   Cardiovascular: Negative.   Genitourinary: Positive for menstrual problem and vaginal bleeding.       Pelvic pain during menses   Past Medical History:  Diagnosis Date  . Headache    only when B/P elevated   . Hypertension   . Infection    UTI  . Pregnancy induced hypertension    Past Surgical History:  Procedure Laterality Date  . BREAST REDUCTION SURGERY    . REDUCTION MAMMAPLASTY Bilateral 2010  . ROBOTIC ASSISTED LAPAROSCOPIC OVARIAN CYSTECTOMY N/A 01/17/2016   Procedure: XI ROBOTIC ASSISTED LAPAROSCOPIC OVARIAN CYSTECTOMY;  Surgeon: Everitt Amber, MD;  Location: [facility];  Service: Gynecology;  Laterality: N/A;   Social History   Socioeconomic History  . Marital status: Married    Spouse name: Not on file  . Number of children: Not on file  . Years of education: Not on file  . Highest education level: Not on file  Occupational History  . Not on file  Tobacco Use  . Smoking status: Former Smoker    Years: 2.00    Types: Cigarettes    Quit date: 10/25/2015    Years since quitting: 4.6  . Smokeless tobacco: Never Used  . Tobacco  comment: hookah  Vaping Use  . Vaping Use: Never used  Substance and Sexual Activity  . Alcohol use: No  . Drug use: No  . Sexual activity: Yes    Partners: Male    Birth control/protection: Implant  Other Topics Concern  . Not on file  Social History Narrative  . Not on file   Social Determinants of Health   Financial Resource Strain: Not on file  Food Insecurity: Not on file  Transportation Needs: Not on file  Physical Activity: Not on file  Stress: Not on file  Social Connections: Not on file  Intimate Partner Violence: Not on file      Objective:   Physical Exam Vitals:   06/15/20 1613  BP: 122/85  Pulse: 72   Pt declined physical exam by female provider due to cultural and personal preference      Assessment & Plan:   1. Uterine leiomyoma, unspecified location Review of chart noted a 2.9 cm subserosal fibroid  In a previous pregnancy 5-6 years ago. Will order current scan for evaluation - US PELVIC COMPLETE WITH TRANSVAGINAL; Future  2. Menorrhagia with regular cycle Pt needs endometrial biopsy and physical exam for further evaluation.  Treatment options: 1. Possibly OCP, non smoker but blood pressure is borderline, pt is on no BP meds 2. Possibly remove nexplanon since she has had  similar symptoms with an earlier device. 3.  Mirena versus ablation if needed 4. Sonata or Acessa procedure if it looks like the fibroid may be contributing to the bleeding and is accessible.  Check labs for anemia and possibility of pregnancy. - CBC - Beta hCG quant (ref lab) - US PELVIC COMPLETE WITH TRANSVAGINAL; Future  F/u in 3 weeks with female provider I spent 15 minutes dedicated to the care of this patient including previsit review of records, face to face time with the patient discussing possible etiology of bleeding, evaluation, treatment options and post visit testing.   Griffin Basil, MD Faculty Attending, [facility]

## 2020-06-15 NOTE — Progress Notes (Signed)
RGYN patient presents for problem visit   LMP: x 3 days ago cycles will last 10-15 x days  Cycles are  very heavy, painful  pt notes dizziness and nausea and vomiting/back pain. .  Takes tylenol for relief does not help much. Pt declines exam today.

## 2020-06-16 LAB — BETA HCG QUANT (REF LAB): hCG Quant: <1 m[IU]/mL

## 2020-06-16 LAB — CBC
Hematocrit: 39.6 % (ref 34.0–46.6)
Hemoglobin: 13.2 g/dL (ref 11.1–15.9)
MCH: 26.6 pg (ref 26.6–33.0)
MCHC: 33.3 g/dL (ref 31.5–35.7)
MCV: 80 fL (ref 79–97)
Platelets: 258 10*3/uL (ref 150–450)
RBC: 4.96 x10E6/uL (ref 3.77–5.28)
RDW: 13.2 % (ref 11.7–15.4)
WBC: 8.1 10*3/uL (ref 3.4–10.8)

## 2020-06-29 ENCOUNTER — Ambulatory Visit
Admission: RE | Admit: 2020-06-29 | Discharge: 2020-06-29 | Disposition: A | Discharge status: Home / Self Care | Payer: Medicaid Other | Source: Ambulatory Visit | Attending: Obstetrics and Gynecology | Admitting: Obstetrics and Gynecology

## 2020-06-29 ENCOUNTER — Other Ambulatory Visit: Payer: Self-pay

## 2020-06-29 DIAGNOSIS — N920 Excessive and frequent menstruation with regular cycle: Secondary | ICD-10-CM | POA: Diagnosis present

## 2020-06-29 DIAGNOSIS — D259 Leiomyoma of uterus, unspecified: Secondary | ICD-10-CM | POA: Diagnosis present

## 2020-06-29 IMAGING — US US PELVIS COMPLETE WITH TRANSVAGINAL
1 series · 15 of 25 positions shown · non-contrast
Comparison: Prior ultrasound from [DATE].

CLINICAL DATA: Initial evaluation for menorrhagia, history of
fibroid.



[Series 1: us pelvis complete with transvaginal · 15 of 122 slices shown]
[im 1/122]
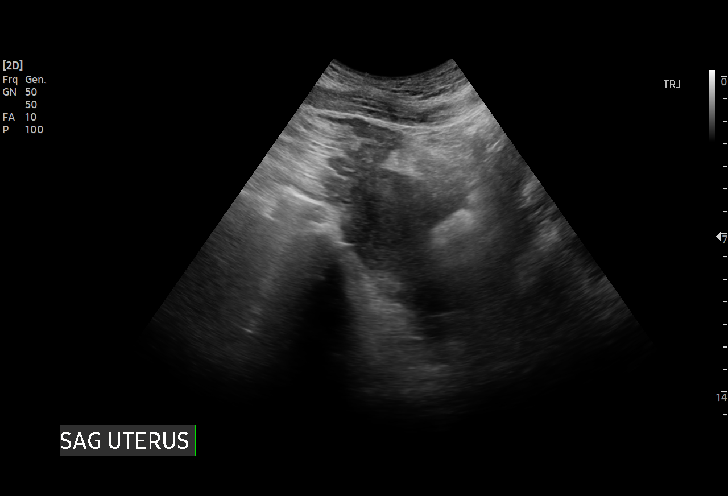
[im 11/122]
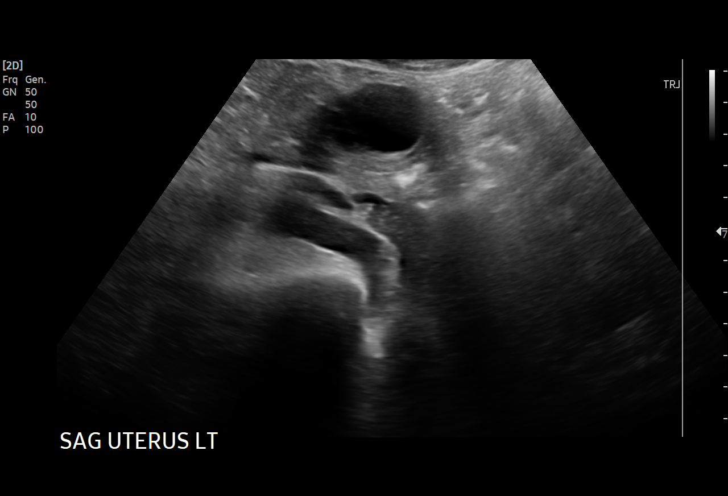
[im 21/122]
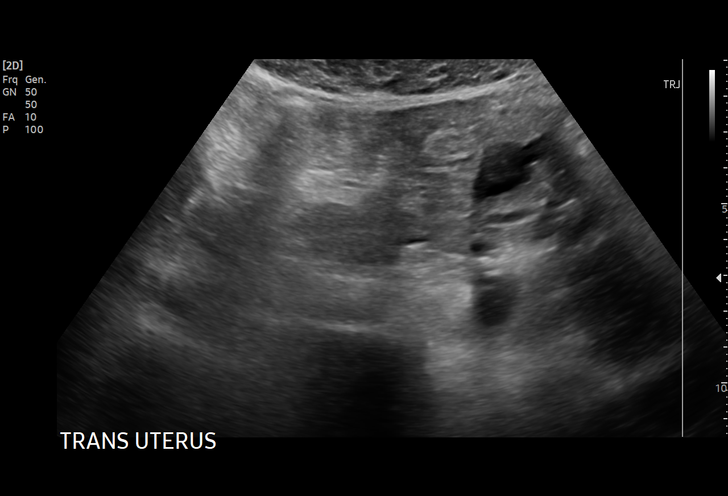
[im 26/122]
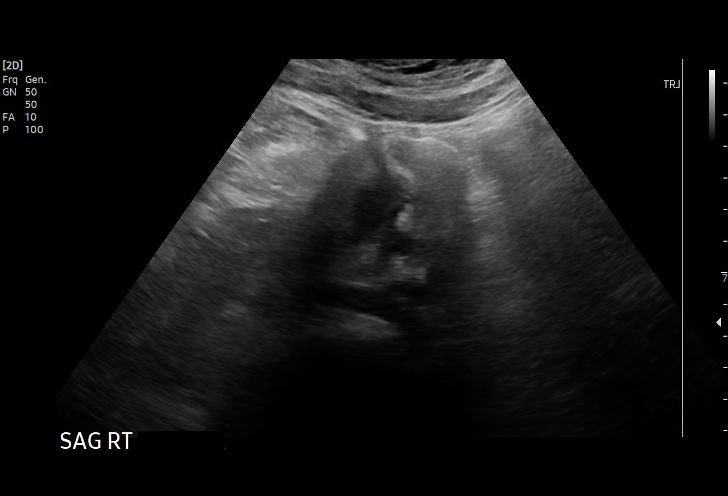
[im 36/122]
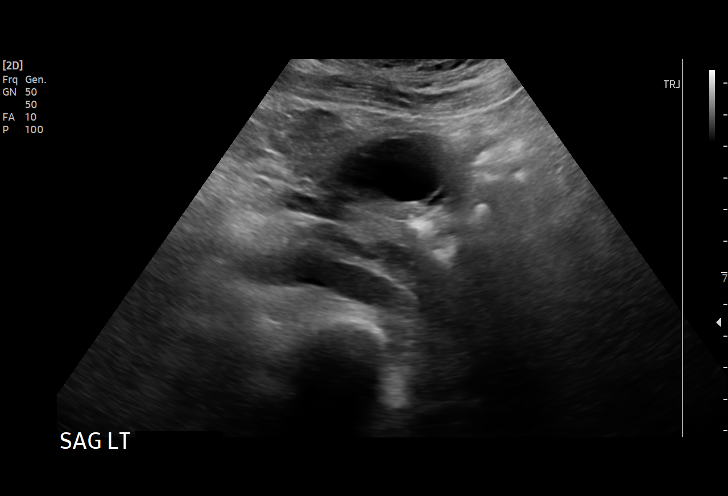
[im 46/122]
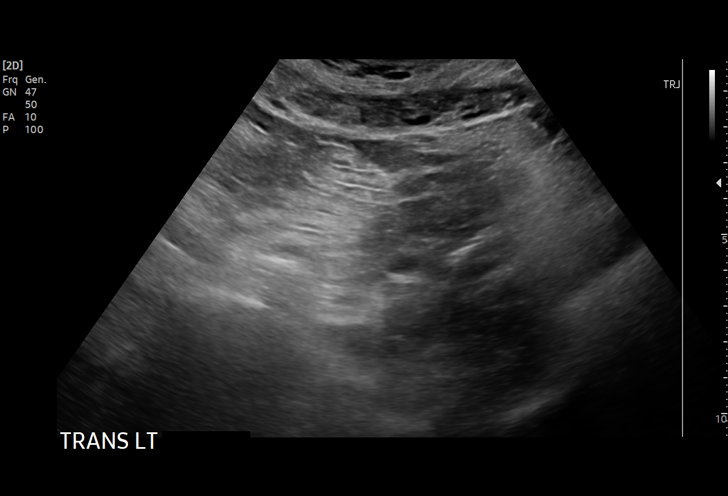
[im 51/122]
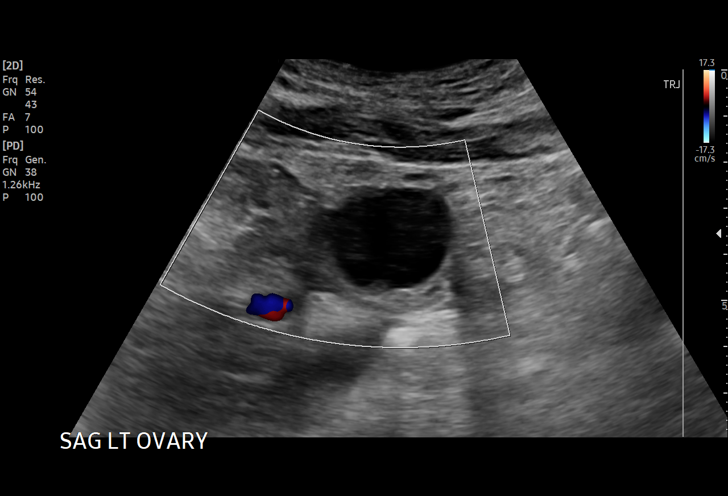
[im 61/122]
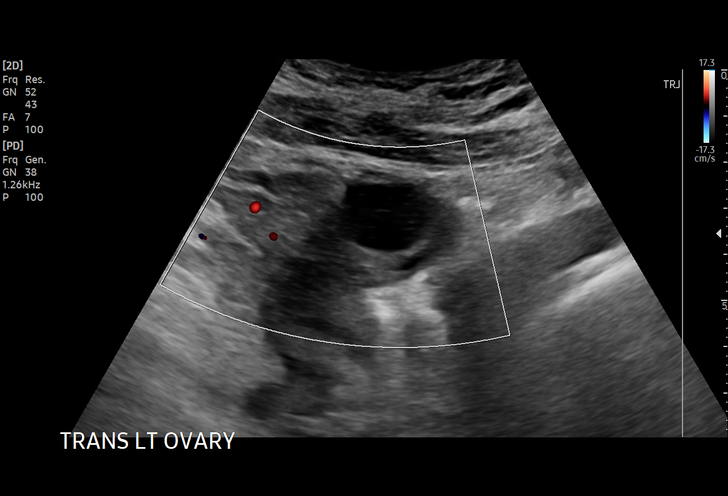
[im 71/122]
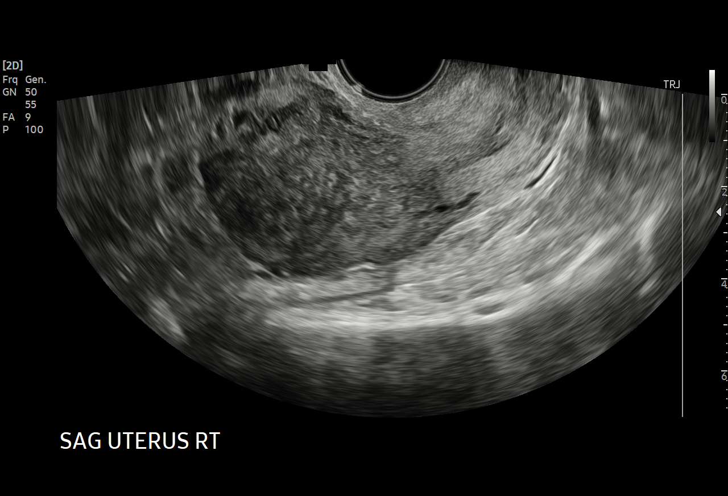
[im 76/122]
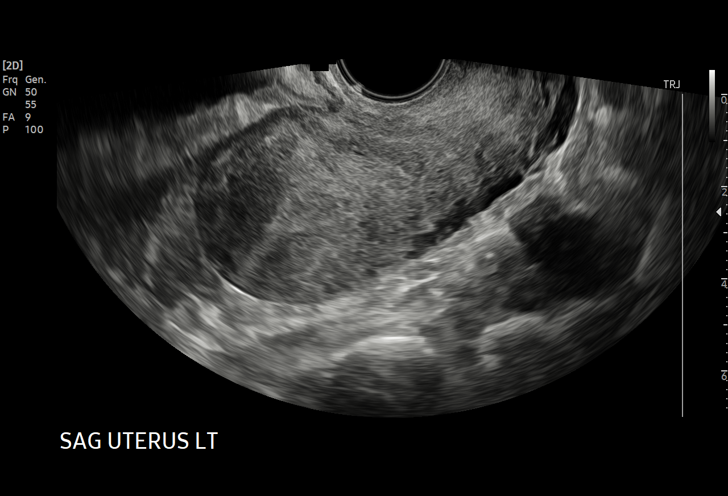
[im 86/122]
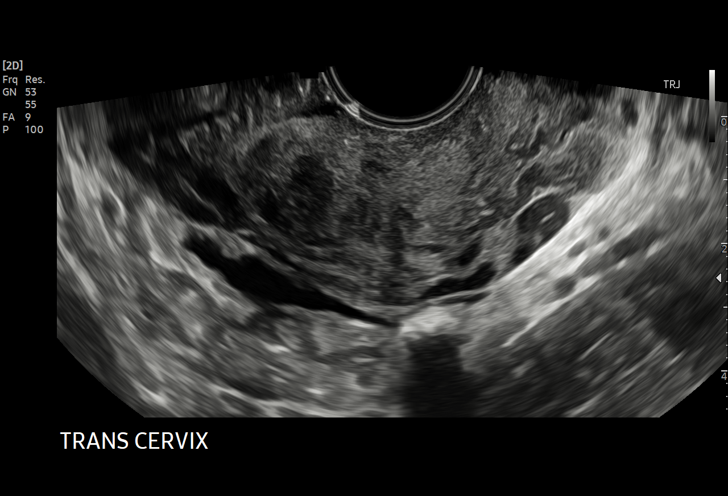
[im 96/122]
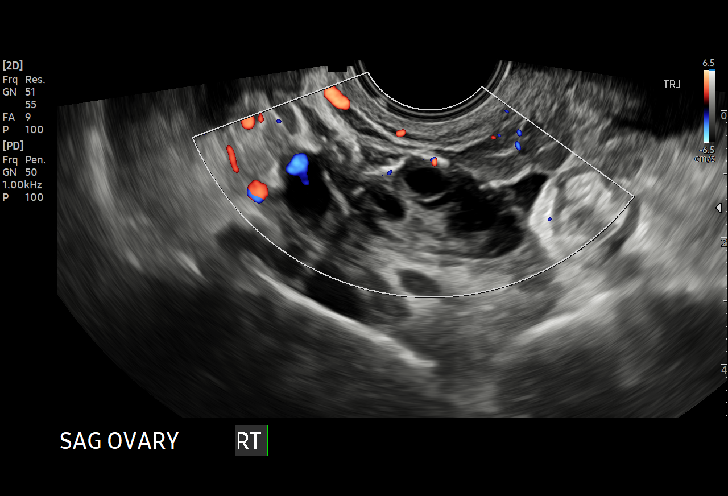
[im 101/122]
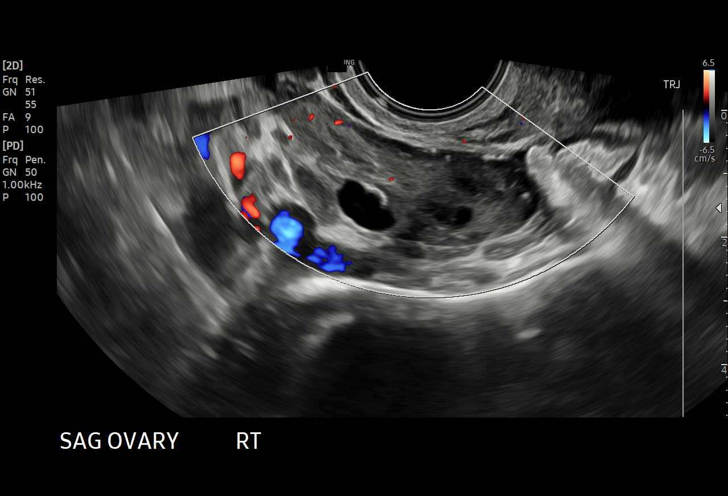
[im 111/122]
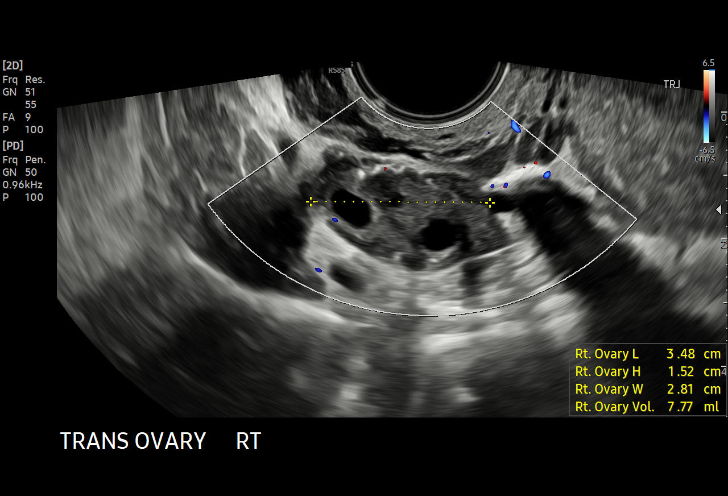
[im 122/122]
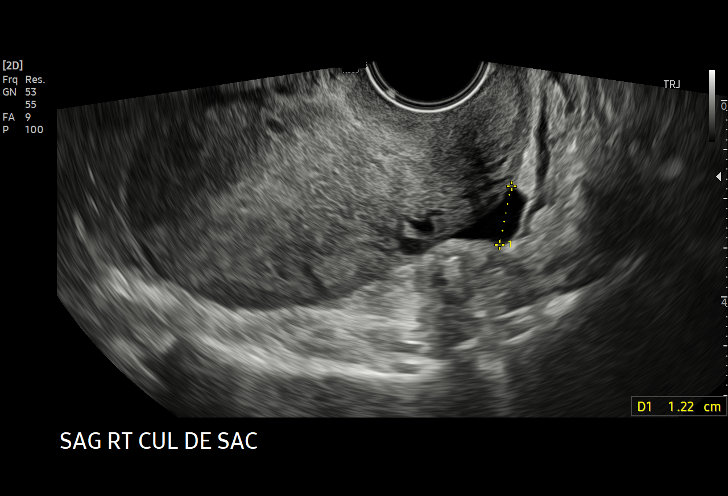

[15 of 25 positions shown; findings below may reference images not displayed]

FINDINGS: Uterus

Measurements: 8.5 x 4.6 x 6.3 cm = volume: 128.4 mL. Uterus is
anteverted. Heterogeneous echotexture within the uterine myometrium.
No discrete fibroid or other mass seen on today's exam.

Endometrium

Thickness: 2.7 mm.  No focal abnormality visualized.

Right ovary

Measurements: 3.5 x 1.5 x 2.8 cm = volume: 7.8 mL. Normal
appearance/no adnexal mass.

Left ovary

Measurements: 4.3 x 1.3 x 2.0 cm = volume: 5.7 mL. Normal
appearance/no adnexal mass. 2.7 cm dominant follicle noted.

Other findings

Trace free fluid noted within the pelvic cul-de-sac.
IMPRESSION: 1. Endometrial stripe measures 2.7 mm in thickness. If bleeding
remains unresponsive to hormonal or medical therapy, sonohysterogram
should be considered for focal lesion work-up. (Ref: Radiological
Reasoning: Algorithmic Workup of Abnormal Vaginal Bleeding with
Endovaginal Sonography and Sonohysterography. AJR [A9]; 191:S68-73).
2. Normal sonographic appearance of the uterus. No discrete fibroid
seen on today's exam.
3. Normal ovaries for age.  No adnexal mass.

## 2020-07-13 ENCOUNTER — Other Ambulatory Visit: Payer: Medicaid Other | Admitting: Obstetrics & Gynecology

## 2020-08-03 ENCOUNTER — Other Ambulatory Visit: Payer: Self-pay

## 2020-08-03 ENCOUNTER — Other Ambulatory Visit (HOSPITAL_COMMUNITY)
Admission: RE | Admit: 2020-08-03 | Discharge: 2020-08-03 | Disposition: A | Discharge status: Home / Self Care | Payer: Medicaid Other | Source: Ambulatory Visit | Attending: Obstetrics & Gynecology | Admitting: Obstetrics & Gynecology

## 2020-08-03 ENCOUNTER — Encounter: Payer: Self-pay | Admitting: Obstetrics and Gynecology

## 2020-08-03 ENCOUNTER — Ambulatory Visit (INDEPENDENT_AMBULATORY_CARE_PROVIDER_SITE_OTHER): Payer: Medicaid Other | Admitting: Obstetrics and Gynecology

## 2020-08-03 VITALS — BP 116/79 | HR 67 | Wt 174.0 lb

## 2020-08-03 DIAGNOSIS — N920 Excessive and frequent menstruation with regular cycle: Secondary | ICD-10-CM

## 2020-08-03 MED ORDER — MEGESTROL ACETATE 40 MG PO TABS: 40.0000 mg | ORAL_TABLET | Freq: Two times a day (BID) | ORAL | 5 refills | Status: DC

## 2020-08-03 NOTE — Addendum Note (Signed)
Addended by: Tamela Oddi on: 08/03/2020 09:27 AM   Modules accepted: Orders

## 2020-08-03 NOTE — Progress Notes (Signed)
40 yo P3 returning today for the evaluation of menorrhagia with regular cycle. Patient is here for endometrial biopsy. Patient reports a monthly 10-15 day period associated with dysmenorrhea which she treats with tylenol. Patient unable to take ibuprofen. Patient is sexually active using Nexplanon for contraception which has been in place since 06/2019.  Past Medical History:  Diagnosis Date  . Headache    only when B/P elevated   . Hypertension   . Infection    UTI  . Pregnancy induced hypertension    Past Surgical History:  Procedure Laterality Date  . BREAST REDUCTION SURGERY    . REDUCTION MAMMAPLASTY Bilateral 2010  . ROBOTIC ASSISTED LAPAROSCOPIC OVARIAN CYSTECTOMY N/A 01/17/2016   Procedure: XI ROBOTIC ASSISTED LAPAROSCOPIC OVARIAN CYSTECTOMY;  Surgeon: Everitt Amber, MD;  Location: [facility];  Service: Gynecology;  Laterality: N/A;   Family History  Problem Relation Age of Onset  . Hypertension Mother   . Diabetes Mother    Social History   Tobacco Use  . Smoking status: Former Smoker    Years: 2.00    Types: Cigarettes    Quit date: 10/25/2015    Years since quitting: 4.7  . Smokeless tobacco: Never Used  . Tobacco comment: hookah  Vaping Use  . Vaping Use: Never used  Substance Use Topics  . Alcohol use: No  . Drug use: No   ROS See pertinent in HPI. All other systems reviewed and non contributory  Blood pressure 116/79, pulse 67, weight 174 lb (78.9 kg), last menstrual period 07/27/2020. GENERAL: Well-developed, well-nourished female in no acute distress.  ABDOMEN: Soft, nontender, nondistended. No organomegaly. PELVIC: Normal external female genitalia. Vagina is pink and rugated.  Normal discharge. Normal appearing cervix. Uterus is normal in size.  No adnexal mass or tenderness. EXTREMITIES: No cyanosis, clubbing, or edema, 2+ distal pulses.  US PELVIC COMPLETE WITH TRANSVAGINAL  Result Date: 06/30/2020 CLINICAL DATA:  Initial evaluation for menorrhagia, history  of fibroid. EXAM: TRANSABDOMINAL AND TRANSVAGINAL ULTRASOUND OF PELVIS TECHNIQUE: Both transabdominal and transvaginal ultrasound examinations of the pelvis were performed. Transabdominal technique was performed for global imaging of the pelvis including uterus, ovaries, adnexal regions, and pelvic cul-de-sac. It was necessary to proceed with endovaginal exam following the transabdominal exam to visualize the uterus, endometrium, and ovaries. COMPARISON:  Prior ultrasound from 11/03/2015. FINDINGS: Uterus Measurements: 8.5 x 4.6 x 6.3 cm = volume: 128.4 mL. Uterus is anteverted. Heterogeneous echotexture within the uterine myometrium. No discrete fibroid or other mass seen on today's exam. Endometrium Thickness: 2.7 mm.  No focal abnormality visualized. Right ovary Measurements: 3.5 x 1.5 x 2.8 cm = volume: 7.8 mL. Normal appearance/no adnexal mass. Left ovary Measurements: 4.3 x 1.3 x 2.0 cm = volume: 5.7 mL. Normal appearance/no adnexal mass. 2.7 cm dominant follicle noted. Other findings Trace free fluid noted within the pelvic cul-de-sac. IMPRESSION: 1. Endometrial stripe measures 2.7 mm in thickness. If bleeding remains unresponsive to hormonal or medical therapy, sonohysterogram should be considered for focal lesion work-up. (Ref: Radiological Reasoning: Algorithmic Workup of Abnormal Vaginal Bleeding with Endovaginal Sonography and Sonohysterography. AJR 2008; 193:X90-24). 2. Normal sonographic appearance of the uterus. No discrete fibroid seen on today's exam. 3. Normal ovaries for age.  No adnexal mass. Electronically Signed   By: Jeannine Boga M.D.   On: 06/30/2020 13:47     A/P 40 yo with menorrhagia with regular cycles - Discussed Nexplanon removal but patient is not ready to have it removed - ENDOMETRIAL BIOPSY  The indications for endometrial biopsy were reviewed.   Risks of the biopsy including cramping, bleeding, infection, uterine perforation, inadequate specimen and need for  additional procedures  were discussed. The patient states she understands and agrees to undergo procedure today. Consent was signed. Time out was performed. Urine HCG was negative. A sterile speculum was placed in the patient's vagina and the cervix was prepped with Betadine. A single-toothed tenaculum was placed on the anterior lip of the cervix to stabilize it. The uterine cavity was sounded to a depth of 8 cm using the uterine sound. The 3 mm pipelle was introduced into the endometrial cavity without difficulty, 2 passes were made.  A  moderate amount of tissue was  sent to pathology. The instruments were removed from the patient's vagina. Minimal bleeding from the cervix was noted. The patient tolerated the procedure well.  Routine post-procedure instructions were given to the patient. The patient will follow up in two weeks to review the results and for further management.   - Reviewed ultrasound results with the patient - Rx megace provided

## 2020-08-03 NOTE — Progress Notes (Signed)
GYN presents for Endo Bx.

## 2020-08-04 LAB — SURGICAL PATHOLOGY

## 2020-08-19 ENCOUNTER — Ambulatory Visit: Payer: Medicaid Other | Attending: Orthopedic Surgery | Admitting: Rehabilitative and Restorative Service Providers"

## 2020-08-19 ENCOUNTER — Encounter: Payer: Self-pay | Admitting: Rehabilitative and Restorative Service Providers"

## 2020-08-19 ENCOUNTER — Other Ambulatory Visit: Payer: Self-pay

## 2020-08-19 DIAGNOSIS — R262 Difficulty in walking, not elsewhere classified: Secondary | ICD-10-CM | POA: Diagnosis present

## 2020-08-19 DIAGNOSIS — G8929 Other chronic pain: Secondary | ICD-10-CM | POA: Diagnosis present

## 2020-08-19 DIAGNOSIS — M6281 Muscle weakness (generalized): Secondary | ICD-10-CM | POA: Diagnosis present

## 2020-08-19 DIAGNOSIS — M25562 Pain in left knee: Secondary | ICD-10-CM | POA: Insufficient documentation

## 2020-08-19 DIAGNOSIS — M25662 Stiffness of left knee, not elsewhere classified: Secondary | ICD-10-CM | POA: Diagnosis present

## 2020-08-19 NOTE — Therapy (Signed)
[place]. [place], [place], [address] Phone: [phone]   Fax:  [phone]  Physical Therapy Evaluation  Patient Details  Name: Nichole Green MRN: [id number] Date of Birth: [birth year] Referring Provider (PT): Dr French Ana   Encounter Date: 08/19/2020   PT End of Session - 08/19/20 1058    Visit Number 1    Date for PT Re-Evaluation 10/14/20    Authorization Type [place] Medicaid    PT Start Time 1017    PT Stop Time 1059    PT Time Calculation (min) 42 min    Activity Tolerance Patient tolerated treatment well    Behavior During Therapy [facility] for tasks assessed/performed           Past Medical History:  Diagnosis Date  . Headache    only when B/P elevated   . Hypertension   . Infection    UTI  . Pregnancy induced hypertension     Past Surgical History:  Procedure Laterality Date  . BREAST REDUCTION SURGERY    . REDUCTION MAMMAPLASTY Bilateral 2010  . ROBOTIC ASSISTED LAPAROSCOPIC OVARIAN CYSTECTOMY N/A 01/17/2016   Procedure: XI ROBOTIC ASSISTED LAPAROSCOPIC OVARIAN CYSTECTOMY;  Surgeon: Everitt Amber, MD;  Location: [facility];  Service: Gynecology;  Laterality: N/A;    There were no vitals filed for this visit.    Subjective Assessment - 08/19/20 1020    Subjective Patient reports that last summer, she fell down half a flight of stairs  and has been having knee pain since that fall.  She reports that recently, the pain has been getting worse, so she was referred to PT.    Limitations Walking;Standing    How long can you stand comfortably? 1 hour    How long can you walk comfortably? 15 min    Patient Stated Goals To be able to do normal activities without pain    Currently in Pain? Yes    Pain Score 8     Pain Location Knee    Pain Orientation Left    Pain Descriptors / Indicators Sharp;Shooting    Pain Type Chronic pain    Pain Radiating Towards down leg    Pain Onset More than a month ago    Pain  Frequency Constant    Aggravating Factors  walking    Pain Relieving Factors Tylenol              [facility] PT Assessment - 08/19/20 0001      Assessment   Medical Diagnosis L knee pain    Referring Provider (PT) Dr French Ana    Hand Dominance Right    Next MD Visit in 6 weeks    Prior Therapy no      Precautions   Precautions None    Required Braces or Orthoses Other Brace/Splint    Other Brace/Splint knee brace      Restrictions   Weight Bearing Restrictions No      Balance Screen   Has the patient fallen in the past 6 months No    Has the patient had a decrease in activity level because of a fear of falling?  No    Is the patient reluctant to leave their home because of a fear of falling?  No      Home Environment   Living Environment Private residence    Living Arrangements Spouse/significant other;Children    Type of [place] Access Level entry  Home Layout Two level;Bed/bath upstairs    Alternate Level Stairs-Number of Steps 14    Alternate Level Stairs-Rails Left      Prior Function   Level of Independence Independent      Cognition   Overall Cognitive Status Within Functional Limits for tasks assessed      Observation/Other Assessments   Focus on Therapeutic Outcomes (FOTO)  62%, Risk Projected 53%      ROM / Strength   AROM / PROM / Strength AROM;Strength      AROM   AROM Assessment Site Knee    Right/Left Knee Left    Left Knee Extension 0    Left Knee Flexion 115      Strength   Overall Strength Comments RLE is 5/5 throughout    Strength Assessment Site Knee    Right/Left Knee Left    Left Knee Flexion 4/5    Left Knee Extension 4/5      Special Tests    Special Tests Knee Special Tests    Knee Special tests  other      other    Findings Positive    Side  Left    Comments Anterior Drawer      Transfers   Five time sit to stand comments  12.42      Balance   Balance Assessed Yes      High Level Balance   High Level  Balance Comments L SLS of 3.5 seconds                      Objective measurements completed on examination: See above findings.       [facility] Adult PT Treatment/Exercise - 08/19/20 0001      Exercises   Exercises Knee/Hip      Knee/Hip Exercises: Supine   Bridges Both;1 set;10 reps    Straight Leg Raises Left;1 set;10 reps      Knee/Hip Exercises: Sidelying   Hip ABduction Left;1 set;10 reps      Knee/Hip Exercises: Prone   Hip Extension Left;1 set;10 reps                  PT Education - 08/19/20 1057    Education Details Provided with HEP    Person(s) Educated Patient    Methods Explanation;Handout    Comprehension Verbalized understanding;Returned demonstration            PT Short Term Goals - 08/19/20 1106      PT SHORT TERM GOAL #1   Title Pt will be independent with initial HEP.    Time 2    Period Weeks    Status New             PT Long Term Goals - 08/19/20 1107      PT LONG TERM GOAL #1   Title Patient will be independent with advanced HEP.    Time 8    Period Weeks    Status New      PT LONG TERM GOAL #2   Title Patient will increase L knee strength to at least 5/5 to allow her to play with her children.    Time 8    Period Weeks    Status New      PT LONG TERM GOAL #3   Title Patient will be able to perform L SLS for at least 15 seconds to decrease her risk of falling.    Time 8    Period  Weeks    Status New      PT LONG TERM GOAL #4   Title Patient will be able to ambulate for greater than 20 minutes with pain no greater than 2/10 to allow her to walk her dog.    Time 8    Period Weeks    Status New                  Plan - 08/19/20 1100    Clinical Impression Statement Patient presents with a referral from Dr Alvester Morin office secondary to R knee pain.  She was having minor knee pain since her fall last summer, but reports just recently that her pain has gotten worse and now her entire leg is hurting.   She presents with L knee stiffness, LLE weakness, increased pain, and difficulty walking.  She has decreased time with LLE single leg stance.  She states that Dr French Ana is going to order a MRI if pain continues.  Patient would benefit from skilled PT to address her functional impairments and allow her to return to her active lifestyle without pain.    Examination-Activity Limitations Locomotion Level;Squat;Stairs    Examination-Participation Restrictions Community Activity    Stability/Clinical Decision Making Stable/Uncomplicated    Clinical Decision Making Low    Rehab Potential Good    PT Frequency 2x / week    PT Duration 8 weeks    PT Treatment/Interventions ADLs/Self Care Home Management;Electrical Stimulation;Iontophoresis 4mg /ml Dexamethasone;Moist Heat;Ultrasound;Gait training;Stair training;Functional mobility training;Therapeutic activities;Therapeutic exercise;Balance training;Patient/family education;Manual techniques;Passive range of motion;Dry needling    PT Next Visit Plan assess HEP, strengthening, balance    PT Home Exercise Plan Access Code: [email or web address]    Consulted and Agree with Plan of Care Patient           Patient will benefit from skilled therapeutic intervention in order to improve the following deficits and impairments:  Difficulty walking,Decreased range of motion,Decreased activity tolerance,Pain,Decreased balance,Impaired flexibility,Decreased strength  Visit Diagnosis: Chronic pain of left knee - Plan: PT plan of care cert/re-cert  Muscle weakness (generalized) - Plan: PT plan of care cert/re-cert  Difficulty in walking, not elsewhere classified - Plan: PT plan of care cert/re-cert  Stiffness of left knee, not elsewhere classified - Plan: PT plan of care cert/re-cert     Problem List Patient Active Problem List   Diagnosis Date Noted  . Menorrhagia with regular cycle 06/15/2020  . Acute pain of left lower extremity 01/17/2020  . Chronic hypertension  06/20/2016  . Postpartum care following vaginal delivery 06/20/2016  . GDM (gestational diabetes mellitus) 04/09/2016  . Uterine fibroid 02/09/2016  . Chronic hypertension during pregnancy, antepartum 10/31/2015    Juel Burrow, PT, DPT 08/19/2020, 11:21 AM  [place] [address]. [place], [place], [address] Phone: [phone]   Fax:  [phone]  Name: Nichole Green MRN: [id number] Date of Birth: Oct 19, 1980

## 2020-08-19 NOTE — Patient Instructions (Signed)
Access Code: [email or web address] URL: [email or web address] Date: 08/19/2020 Prepared by: Shelby Dubin Deazia Lampi  Exercises Straight Leg Raise - 1-2 x daily - 7 x weekly - 2 sets - 10 reps Bridge - 1-2 x daily - 7 x weekly - 2 sets - 10 reps Sidelying Hip Abduction - 1-2 x daily - 7 x weekly - 2 sets - 10 reps Prone Hip Extension - 1-2 x daily - 7 x weekly - 2 sets - 10 reps

## 2020-08-26 ENCOUNTER — Ambulatory Visit: Payer: Medicaid Other

## 2020-08-30 ENCOUNTER — Ambulatory Visit: Payer: Medicaid Other | Attending: Orthopedic Surgery | Admitting: Physical Therapy

## 2020-08-30 ENCOUNTER — Other Ambulatory Visit: Payer: Self-pay

## 2020-08-30 DIAGNOSIS — M25662 Stiffness of left knee, not elsewhere classified: Secondary | ICD-10-CM | POA: Diagnosis present

## 2020-08-30 DIAGNOSIS — M25562 Pain in left knee: Secondary | ICD-10-CM | POA: Diagnosis present

## 2020-08-30 DIAGNOSIS — M6281 Muscle weakness (generalized): Secondary | ICD-10-CM

## 2020-08-30 DIAGNOSIS — R262 Difficulty in walking, not elsewhere classified: Secondary | ICD-10-CM

## 2020-08-30 DIAGNOSIS — G8929 Other chronic pain: Secondary | ICD-10-CM

## 2020-08-30 NOTE — Therapy (Signed)
[place]. [place], [place], [address] Phone: [phone]   Fax:  [phone]  Physical Therapy Treatment  Patient Details  Name: Nichole Green MRN: [id number] Date of Birth: [birth year] Referring Provider (PT): Dr French Ana   Encounter Date: 08/30/2020   PT End of Session - 08/30/20 1344    Visit Number 2    Date for PT Re-Evaluation 10/14/20    Authorization Type [place] Medicaid    PT Start Time 9518    PT Stop Time 1344    PT Time Calculation (min) 39 min           Past Medical History:  Diagnosis Date  . Headache    only when B/P elevated   . Hypertension   . Infection    UTI  . Pregnancy induced hypertension     Past Surgical History:  Procedure Laterality Date  . BREAST REDUCTION SURGERY    . REDUCTION MAMMAPLASTY Bilateral 2010  . ROBOTIC ASSISTED LAPAROSCOPIC OVARIAN CYSTECTOMY N/A 01/17/2016   Procedure: XI ROBOTIC ASSISTED LAPAROSCOPIC OVARIAN CYSTECTOMY;  Surgeon: Everitt Amber, MD;  Location: [facility];  Service: Gynecology;  Laterality: N/A;    There were no vitals filed for this visit.   Subjective Assessment - 08/30/20 1305    Subjective amb in with knee brace on ( took brace off for tx). verb HEP is going fine. Nights are most painful    Currently in Pain? Yes    Pain Score 2     Pain Location Knee    Pain Orientation Left                             [facility] Adult PT Treatment/Exercise - 08/30/20 0001      Knee/Hip Exercises: Stretches   ITB Stretch Left;3 reps;20 seconds   supine   Piriformis Stretch Left;3 reps;20 seconds      Knee/Hip Exercises: Aerobic   Nustep L 4 6 min      Knee/Hip Exercises: Standing   Other Standing Knee Exercises left SLR toes fwd and then in ER 12 each      Knee/Hip Exercises: Seated   Long Arc Quad Strengthening;Left;2 sets;10 reps   VMO ball squeeze with red tband     Knee/Hip Exercises: Supine   Bridges Strengthening;Both;2 sets;10  reps   VMO ball squeeze   Straight Leg Raises Strengthening;Left;10 reps;3 sets   red tband 1 set toes up, 1 set ER, 1 set SLR with abd     Manual Therapy   Manual Therapy Passive ROM;Soft tissue mobilization    Manual therapy comments pt with c/o left thigh pain. passi=vely can touch left heel to buttock prone- no resistance felt but c/o thigh pain    Soft tissue mobilization ITB stripping and stretch with CR   educ in SL stretch at home off edge of bed   Passive ROM left quad and hip flexor                  PT Education - 08/30/20 1343    Education Details ITB stretch SL off bed, trunk rotation and piriformis. educ on ITB stripping at home by family memeber    Person(s) Educated Patient    Methods Explanation;Demonstration;Handout    Comprehension Verbalized understanding;Returned demonstration            PT Short Term Goals - 08/30/20 1344      PT SHORT TERM  GOAL #1   Title Pt will be independent with initial HEP.    Status Achieved             PT Long Term Goals - 08/19/20 1107      PT LONG TERM GOAL #1   Title Patient will be independent with advanced HEP.    Time 8    Period Weeks    Status New      PT LONG TERM GOAL #2   Title Patient will increase L knee strength to at least 5/5 to allow her to play with her children.    Time 8    Period Weeks    Status New      PT LONG TERM GOAL #3   Title Patient will be able to perform L SLS for at least 15 seconds to decrease her risk of falling.    Time 8    Period Weeks    Status New      PT LONG TERM GOAL #4   Title Patient will be able to ambulate for greater than 20 minutes with pain no greater than 2/10 to allow her to walk her dog.    Time 8    Period Weeks    Status New                 Plan - 08/30/20 1345    Clinical Impression Statement STG met and added HEP for ITB stretching and stripping. PROM of knee and left quad/hip flexor full without signs of tracking issues. Left ITB is very  tight and painful- after MT and stteching no pain. 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 ex somewhat limited by pain.    PT Treatment/Interventions ADLs/Self Care Home Management;Electrical Stimulation;Iontophoresis 2m/ml Dexamethasone;Moist Heat;Ultrasound;Gait training;Stair training;Functional mobility training;Therapeutic activities;Therapeutic exercise;Balance training;Patient/family education;Manual techniques;Passive range of motion;Dry needling    PT Next Visit Plan assess relief with stretching           Patient will benefit from skilled therapeutic intervention in order to improve the following deficits and impairments:  Difficulty walking,Decreased range of motion,Decreased activity tolerance,Pain,Decreased balance,Impaired flexibility,Decreased strength  Visit Diagnosis: Chronic pain of left knee  Muscle weakness (generalized)  Difficulty in walking, not elsewhere classified     Problem List Patient Active Problem List   Diagnosis Date Noted  . Menorrhagia with regular cycle 06/15/2020  . Acute pain of left lower extremity 01/17/2020  . Chronic hypertension 06/20/2016  . Postpartum care following vaginal delivery 06/20/2016  . GDM (gestational diabetes mellitus) 04/09/2016  . Uterine fibroid 02/09/2016  . Chronic hypertension during pregnancy, antepartum 10/31/2015    Nichole Green,Nichole Green 08/30/2020, 1:47 PM  [place] [address]Phone: [phone]  Fax:  [phone] Name: Nichole PadenMRN: [id number] of Birth: 512-Nov-1982

## 2020-09-01 ENCOUNTER — Ambulatory Visit: Payer: Medicaid Other

## 2020-09-05 ENCOUNTER — Other Ambulatory Visit: Payer: Self-pay

## 2020-09-05 ENCOUNTER — Encounter: Payer: Self-pay | Admitting: Rehabilitative and Restorative Service Providers"

## 2020-09-05 ENCOUNTER — Ambulatory Visit: Payer: Medicaid Other | Admitting: Rehabilitative and Restorative Service Providers"

## 2020-09-05 DIAGNOSIS — R262 Difficulty in walking, not elsewhere classified: Secondary | ICD-10-CM

## 2020-09-05 DIAGNOSIS — M25562 Pain in left knee: Secondary | ICD-10-CM | POA: Diagnosis not present

## 2020-09-05 DIAGNOSIS — M25662 Stiffness of left knee, not elsewhere classified: Secondary | ICD-10-CM

## 2020-09-05 DIAGNOSIS — G8929 Other chronic pain: Secondary | ICD-10-CM

## 2020-09-05 DIAGNOSIS — M6281 Muscle weakness (generalized): Secondary | ICD-10-CM

## 2020-09-05 NOTE — Therapy (Signed)
[place]. [place], [place], [address] Phone: [phone]   Fax:  [phone]  Physical Therapy Treatment  Patient Details  Name: Thao Bauza MRN: [id number] Date of Birth: 1981/01/23 Referring Provider (PT): Dr French Ana   Encounter Date: 09/05/2020   PT End of Session - 09/05/20 1235     Visit Number 3    Date for PT Re-Evaluation 10/14/20    Authorization Type [place] Medicaid    PT Start Time 6962    PT Stop Time 9528    PT Time Calculation (min) 45 min    Activity Tolerance Patient tolerated treatment well    Behavior During Therapy [facility] for tasks assessed/performed             Past Medical History:  Diagnosis Date   Headache    only when B/P elevated    Hypertension    Infection    UTI   Pregnancy induced hypertension     Past Surgical History:  Procedure Laterality Date   BREAST REDUCTION SURGERY     REDUCTION MAMMAPLASTY Bilateral 2010   ROBOTIC ASSISTED LAPAROSCOPIC OVARIAN CYSTECTOMY N/A 01/17/2016   Procedure: XI ROBOTIC ASSISTED LAPAROSCOPIC OVARIAN CYSTECTOMY;  Surgeon: Everitt Amber, MD;  Location: [facility];  Service: Gynecology;  Laterality: N/A;    There were no vitals filed for this visit.   Subjective Assessment - 09/05/20 1234     Subjective I am feeling better.    Currently in Pain? Yes    Pain Score 2     Pain Location Knee    Pain Orientation Left    Pain Descriptors / Indicators Throbbing                               [facility] Adult PT Treatment/Exercise - 09/05/20 0001       Knee/Hip Exercises: Stretches   Active Hamstring Stretch Left;3 reps;20 seconds    Active Hamstring Stretch Limitations 90-90 supine poistion    ITB Stretch Left;3 reps;20 seconds   in supine   Piriformis Stretch Left;3 reps;20 seconds      Knee/Hip Exercises: Aerobic   Nustep L 4 6 min      Knee/Hip Exercises: Machines for Strengthening   Cybex Knee Flexion 25# x10       Knee/Hip Exercises: Standing   Walking with Sports Cord 20# 3 way x3 each   with step up on 6" step up   Other Standing Knee Exercises left SLR toes fwd and then in ER 12 each      Knee/Hip Exercises: Seated   Long Arc Quad Strengthening;Left;2 sets;10 reps   VMO with ball squeeze   Sit to Sand 2 sets;10 reps   yellow ball x10 with CP, x10 with OHP     Knee/Hip Exercises: Supine   Bridges Strengthening;Both;2 sets;10 reps   with VMO ball squeeze   Straight Leg Raises Strengthening;Left;10 reps;3 sets      Manual Therapy   Manual Therapy Passive ROM;Soft tissue mobilization    Manual therapy comments pt with c/o left thigh pain. passi=vely can touch left heel to buttock prone- no resistance felt but c/o thigh pain    Soft tissue mobilization ITB stripping and stretch with CR    Passive ROM left quad and hip flexor                      PT Short Term  Goals - 08/30/20 1344       PT SHORT TERM GOAL #1   Title Pt will be independent with initial HEP.    Status Achieved               PT Long Term Goals - 09/05/20 1322       PT LONG TERM GOAL #1   Title Patient will be independent with advanced HEP.    Status On-going      PT LONG TERM GOAL #2   Title Patient will increase L knee strength to at least 5/5 to allow her to play with her children.    Status On-going      PT LONG TERM GOAL #3   Title Patient will be able to perform L SLS for at least 15 seconds to decrease her risk of falling.    Status On-going      PT LONG TERM GOAL #4   Title Patient will be able to ambulate for greater than 20 minutes with pain no greater than 2/10 to allow her to walk her dog.    Status On-going                   Plan - 09/05/20 1320     Clinical Impression Statement Patient continues to make progress towards goal related activities.  She had improved ROM noted today with PROM, but continues to have tight L IT band.  Educated pt on use of a foam roll at home for  self stripping of L IT band, she returns demonstration and verbalized understanding.  She continues to require skilled PT to progress with improved function of L knee and decreased pain.    PT Treatment/Interventions ADLs/Self Care Home Management;Electrical Stimulation;Iontophoresis 4mg /ml Dexamethasone;Moist Heat;Ultrasound;Gait training;Stair training;Functional mobility training;Therapeutic activities;Therapeutic exercise;Balance training;Patient/family education;Manual techniques;Passive range of motion;Dry needling    PT Next Visit Plan contiue to progress stretching, strengthening    Consulted and Agree with Plan of Care Patient             Patient will benefit from skilled therapeutic intervention in order to improve the following deficits and impairments:  Difficulty walking, Decreased range of motion, Decreased activity tolerance, Pain, Decreased balance, Impaired flexibility, Decreased strength  Visit Diagnosis: Chronic pain of left knee  Muscle weakness (generalized)  Difficulty in walking, not elsewhere classified  Stiffness of left knee, not elsewhere classified     Problem List Patient Active Problem List   Diagnosis Date Noted   Menorrhagia with regular cycle 06/15/2020   Acute pain of left lower extremity 01/17/2020   Chronic hypertension 06/20/2016   Postpartum care following vaginal delivery 06/20/2016   GDM (gestational diabetes mellitus) 04/09/2016   Uterine fibroid 02/09/2016   Chronic hypertension during pregnancy, antepartum 10/31/2015    Juel Burrow, PT, DPT 09/05/2020, 1:23 PM  [place]. [place], [place], [address] Phone: [phone]   Fax:  [phone]  Name: Miciah Shealy MRN: [id number] Date of Birth: 04-06-1980

## 2020-09-08 ENCOUNTER — Ambulatory Visit: Payer: Medicaid Other | Admitting: Physical Therapy

## 2020-09-08 ENCOUNTER — Other Ambulatory Visit: Payer: Self-pay

## 2020-09-08 DIAGNOSIS — G8929 Other chronic pain: Secondary | ICD-10-CM

## 2020-09-08 DIAGNOSIS — M25562 Pain in left knee: Secondary | ICD-10-CM

## 2020-09-08 DIAGNOSIS — M6281 Muscle weakness (generalized): Secondary | ICD-10-CM

## 2020-09-08 DIAGNOSIS — R262 Difficulty in walking, not elsewhere classified: Secondary | ICD-10-CM

## 2020-09-08 NOTE — Therapy (Addendum)
[place] [place], [place], [address] Phone: [phone]   Fax:  [phone]  Physical Therapy Treatment  Patient Details  Name: Nichole Green MRN: [id number] Date of Birth: 1980/07/28 Referring Provider (PT): Dr French Ana   Encounter Date: 09/08/2020   PT End of Session - 09/08/20 1525     Visit Number 4    Date for PT Re-Evaluation 10/14/20    Authorization Type [place] Medicaid    PT Start Time 3734    PT Stop Time 2876    PT Time Calculation (min) 50 min             Past Medical History:  Diagnosis Date   Headache    only when B/P elevated    Hypertension    Infection    UTI   Pregnancy induced hypertension     Past Surgical History:  Procedure Laterality Date   BREAST REDUCTION SURGERY     REDUCTION MAMMAPLASTY Bilateral 2010   ROBOTIC ASSISTED LAPAROSCOPIC OVARIAN CYSTECTOMY N/A 01/17/2016   Procedure: XI ROBOTIC ASSISTED LAPAROSCOPIC OVARIAN CYSTECTOMY;  Surgeon: Everitt Amber, MD;  Location: [facility];  Service: Gynecology;  Laterality: N/A;    There were no vitals filed for this visit.   Subjective Assessment - 09/08/20 1450     Subjective 50% better, days are better but nights pain gets up to 5/10    Currently in Pain? Yes    Pain Score 2     Pain Location Knee    Pain Orientation Left                               [facility] Adult PT Treatment/Exercise - 09/08/20 0001       Knee/Hip Exercises: Aerobic   Recumbent Bike L 4 5 min increased left ant thigh pain   cued to not let knees fall in     Knee/Hip Exercises: Standing   Lateral Step Up Both;1 set;10 reps;Hand Hold: 0;Step Height: 6"   opp leg abd   Forward Step Up Both;1 set;10 reps;Hand Hold: 0;Step Height: 6"   opp leg ext   Walking with Sports Cord 30# 4 way 4 x each    Other Standing Knee Exercises ADD step up left 10 x      Knee/Hip Exercises: Supine   Bridges with Ball Squeeze Strengthening;Both;15 reps     Other Supine Knee/Hip Exercises feet on ball bridge and KTC 10 each      Manual Therapy   Manual Therapy Soft tissue mobilization    Soft tissue mobilization ITB stripping, STW lat and ant thight, added DN and MD after              Trigger Point Dry Needling - 09/08/20 0001     Consent Given? Yes    Education Handout Provided Yes    Muscles Treated Lower Quadrant Vastus lateralis    Vastus lateralis Response Twitch response elicited;Palpable increased muscle length                    PT Short Term Goals - 08/30/20 1344       PT SHORT TERM GOAL #1   Title Pt will be independent with initial HEP.    Status Achieved               PT Long Term Goals - 09/08/20 1525       PT LONG  TERM GOAL #1   Title Patient will be independent with advanced HEP.    Status On-going      PT LONG TERM GOAL #2   Title Patient will increase L knee strength to at least 5/5 to allow her to play with her children.    Status Partially Met      PT LONG TERM GOAL #3   Title Patient will be able to perform L SLS for at least 15 seconds to decrease her risk of falling.    Status Partially Met      PT LONG TERM GOAL #4   Title Patient will be able to ambulate for greater than 20 minutes with pain no greater than 2/10 to allow her to walk her dog.    Status Partially Met                   Plan - 09/08/20 1526     Clinical Impression Statement pt verb 50% better, pain does increase at night . pt verb doing HEP . some pain with ex, bike caused pain and pt reports sitting is hard. TP distal ITB and thigh and pain limited with STW added DN to see if can relax muscle.    PT Next Visit Plan assess DN and progress             Patient will benefit from skilled therapeutic intervention in order to improve the following deficits and impairments:  Difficulty walking, Decreased range of motion, Decreased activity tolerance, Pain, Decreased balance, Impaired flexibility,  Decreased strength  Visit Diagnosis: Chronic pain of left knee  Muscle weakness (generalized)  Difficulty in walking, not elsewhere classified     Problem List Patient Active Problem List   Diagnosis Date Noted   Menorrhagia with regular cycle 06/15/2020   Acute pain of left lower extremity 01/17/2020   Chronic hypertension 06/20/2016   Postpartum care following vaginal delivery 06/20/2016   GDM (gestational diabetes mellitus) 04/09/2016   Uterine fibroid 02/09/2016   Chronic hypertension during pregnancy, antepartum 10/31/2015    Sumner Boast PTA 09/08/2020, 4:42 PM  [place]. [place], [place], [address] Phone: [phone]   Fax:  [phone]  Name: Greg Eckrich MRN: [id number] Date of Birth: [birth year]

## 2020-09-08 NOTE — Patient Instructions (Signed)

## 2020-09-13 ENCOUNTER — Other Ambulatory Visit: Payer: Self-pay

## 2020-09-13 ENCOUNTER — Ambulatory Visit: Payer: Medicaid Other | Admitting: Physical Therapy

## 2020-09-13 DIAGNOSIS — M6281 Muscle weakness (generalized): Secondary | ICD-10-CM

## 2020-09-13 DIAGNOSIS — G8929 Other chronic pain: Secondary | ICD-10-CM

## 2020-09-13 DIAGNOSIS — M25562 Pain in left knee: Secondary | ICD-10-CM | POA: Diagnosis not present

## 2020-09-13 NOTE — Therapy (Signed)
[facility] [address] [place], [place], [address] Phone: [phone]   Fax:  [phone]  Physical Therapy Treatment  Patient Details  Name: Nichole Green MRN: [id number] Date of Birth: 1980/10/16 Referring Provider (PT): Dr Madelon Lips   Encounter Date: 09/13/2020   PT End of Session - 09/13/20 1402     Visit Number 5    Date for PT Re-Evaluation 10/14/20    PT Start Time 1310    PT Stop Time 1400    PT Time Calculation (min) 50 min             Past Medical History:  Diagnosis Date   Headache    only when B/P elevated    Hypertension    Infection    UTI   Pregnancy induced hypertension     Past Surgical History:  Procedure Laterality Date   BREAST REDUCTION SURGERY     REDUCTION MAMMAPLASTY Bilateral 2010   ROBOTIC ASSISTED LAPAROSCOPIC OVARIAN CYSTECTOMY N/A 01/17/2016   Procedure: XI ROBOTIC ASSISTED LAPAROSCOPIC OVARIAN CYSTECTOMY;  Surgeon: Adolphus Birchwood, MD;  Location: [facility];  Service: Gynecology;  Laterality: N/A;    There were no vitals filed for this visit.   Subjective Assessment - 09/13/20 1314     Subjective no pain for 1 day the back to same. pain is worse at night and no better and when I stand from sitting its like I have to stay bent alittle before I can stand- all pain front of left leg                               [facility] Adult PT Treatment/Exercise - 09/13/20 0001       Knee/Hip Exercises: Aerobic   Recumbent Bike L 4 5 min      Knee/Hip Exercises: Standing   Walking with Sports Cord 30# 4 way 4 x each      Modalities   Modalities Iontophoresis      Iontophoresis   Type of Iontophoresis Dexamethasone    Location Left SI    Dose 1.2 cc    Time 49mA 4 hour leave on patch      Manual Therapy   Manual Therapy Soft tissue mobilization;Myofascial release;Passive ROM;Manual Traction;Muscle Energy Technique    Manual therapy comments pain with STW to left ant thigh  and Left SI    Soft tissue mobilization Left ant thigh,left IT and left SI    Myofascial Release Left ant thigh and SI    Passive ROM Left hip    Manual Traction lumbar NE    Muscle Energy Technique LLE                      PT Short Term Goals - 08/30/20 1344       PT SHORT TERM GOAL #1   Title Pt will be independent with initial HEP.    Status Achieved               PT Long Term Goals - 09/08/20 1525       PT LONG TERM GOAL #1   Title Patient will be independent with advanced HEP.    Status On-going      PT LONG TERM GOAL #2   Title Patient will increase L knee strength to at least 5/5 to allow her to play with her children.    Status Partially Met  PT LONG TERM GOAL #3   Title Patient will be able to perform L SLS for at least 15 seconds to decrease her risk of falling.    Status Partially Met      PT LONG TERM GOAL #4   Title Patient will be able to ambulate for greater than 20 minutes with pain no greater than 2/10 to allow her to walk her dog.    Status Partially Met                   Plan - 09/13/20 1403     Clinical Impression Statement pt arrived with c/o ant thigh pain,worse at night and upon rising from seated " have to stay bent over for a minute" .With STW pt very tight and pain limited with STW to ant thigh and SI,STW to SI increased Left  thigh pain. Question if ant thigh pain is radiating from Left SI so added ionto    PT Treatment/Interventions ADLs/Self Care Home Management;Electrical Stimulation;Iontophoresis 4mg /ml Dexamethasone;Moist Heat;Ultrasound;Gait training;Stair training;Functional mobility training;Therapeutic activities;Therapeutic exercise;Balance training;Patient/family education;Manual techniques;Passive range of motion;Dry needling    PT Next Visit Plan assess ionto. asked pt to make MD follow up             Patient will benefit from skilled therapeutic intervention in order to improve the following  deficits and impairments:  Difficulty walking, Decreased range of motion, Decreased activity tolerance, Pain, Decreased balance, Impaired flexibility, Decreased strength  Visit Diagnosis: Chronic pain of left knee  Muscle weakness (generalized)     Problem List Patient Active Problem List   Diagnosis Date Noted   Menorrhagia with regular cycle 06/15/2020   Acute pain of left lower extremity 01/17/2020   Chronic hypertension 06/20/2016   Postpartum care following vaginal delivery 06/20/2016   GDM (gestational diabetes mellitus) 04/09/2016   Uterine fibroid 02/09/2016   Chronic hypertension during pregnancy, antepartum 10/31/2015    Yeraldi Fidler,ANGIE PTA 09/13/2020, 2:20 PM  [place]. [place], [place], [address] Phone: [phone]   Fax:  [phone]  Name: Nichole Green MRN: [id number] Date of Birth: 04/12/1980

## 2020-09-15 ENCOUNTER — Other Ambulatory Visit: Payer: Self-pay

## 2020-09-15 ENCOUNTER — Ambulatory Visit: Payer: Medicaid Other

## 2020-09-15 DIAGNOSIS — M25662 Stiffness of left knee, not elsewhere classified: Secondary | ICD-10-CM

## 2020-09-15 DIAGNOSIS — M25562 Pain in left knee: Secondary | ICD-10-CM | POA: Diagnosis not present

## 2020-09-15 DIAGNOSIS — G8929 Other chronic pain: Secondary | ICD-10-CM

## 2020-09-15 DIAGNOSIS — R262 Difficulty in walking, not elsewhere classified: Secondary | ICD-10-CM

## 2020-09-15 DIAGNOSIS — M6281 Muscle weakness (generalized): Secondary | ICD-10-CM

## 2020-09-15 NOTE — Therapy (Signed)
[place]. [place], [place], [address] Phone: [phone]   Fax:  [phone]  Physical Therapy Treatment  Patient Details  Name: Nichole Green MRN: [id number] Date of Birth: Jun 02, 1980 Referring Provider (PT): Dr French Ana   Encounter Date: 09/15/2020   PT End of Session - 09/15/20 1739     Visit Number 6    Date for PT Re-Evaluation 10/14/20    Authorization Type [place] Medicaid    PT Start Time 0973   pt arrived late   PT Stop Time 1532    PT Time Calculation (min) 40 min    Activity Tolerance Patient tolerated treatment well;No increased pain;Patient limited by pain    Behavior During Therapy [facility] for tasks assessed/performed             Past Medical History:  Diagnosis Date   Headache    only when B/P elevated    Hypertension    Infection    UTI   Pregnancy induced hypertension     Past Surgical History:  Procedure Laterality Date   BREAST REDUCTION SURGERY     REDUCTION MAMMAPLASTY Bilateral 2010   ROBOTIC ASSISTED LAPAROSCOPIC OVARIAN CYSTECTOMY N/A 01/17/2016   Procedure: XI ROBOTIC ASSISTED LAPAROSCOPIC OVARIAN CYSTECTOMY;  Surgeon: Everitt Amber, MD;  Location: [facility];  Service: Gynecology;  Laterality: N/A;    There were no vitals filed for this visit.   Subjective Assessment - 09/15/20 1454     Subjective Pt reports she couldn't sleep the other night due to pain in her L leg. She follows up with her doctor on Monday.    Currently in Pain? Yes    Pain Score 8     Pain Orientation Left;Proximal    Pain Descriptors / Indicators Patsi Sears PT Assessment - 09/15/20 0001       Palpation   Palpation comment very TTP L sacrum and L1-5 with PAs      Special Tests    Special Tests Sacrolliac Tests;Leg LengthTest    Sacroiliac Tests  Sacral Compression    Leg length test  other      Pelvic Dictraction   Findings Positive    Side  Left      Pelvic Compression    Findings Positive    Side Left      Gaenslen's test   Findings Positive    Side  Left      Sacral Compression   Findings Positive      other    Length symmetrical      other    Findings Positive    Side  Left    Comments Gillet's and forward bend                           [facility] Adult PT Treatment/Exercise - 09/15/20 0001       Manual Therapy   Manual Therapy Joint mobilization;Passive ROM;Muscle Energy Technique;Soft tissue mobilization;Taping    Joint Mobilization L sacral mobs gentle (poor tolerance), L LE LAD (some knee pain)    Passive ROM L hip IR/ER    Muscle Energy Technique L hip abd/ER in clamshell position sidelying, supine hip ADD/ABD (improved post manual and sidelying MET)   significant L hip weakness and quick fatigue 10x5"   Kinesiotex Facilitate Muscle      Kinesiotix   Facilitate Muscle  X pattern from SIJ to opp PSIS B to facilitate lumbopelvic rhythm                    PT Education - 09/15/20 1735     Education Details returning to PCP to f/u on continued sypmtoms without much relief, potential for lumbar origin of nerve pain as well as SIJ inflammation --> (+) Gillet's test and forward bend for hypomobile L SIJ    Person(s) Educated Patient    Methods Explanation;Verbal cues;Demonstration    Comprehension Verbalized understanding;Verbal cues required;Returned demonstration              PT Short Term Goals - 08/30/20 1344       PT SHORT TERM GOAL #1   Title Pt will be independent with initial HEP.    Status Achieved               PT Long Term Goals - 09/08/20 1525       PT LONG TERM GOAL #1   Title Patient will be independent with advanced HEP.    Status On-going      PT LONG TERM GOAL #2   Title Patient will increase L knee strength to at least 5/5 to allow her to play with her children.    Status Partially Met      PT LONG TERM GOAL #3   Title Patient will be able to perform L SLS for at least 15  seconds to decrease her risk of falling.    Status Partially Met      PT LONG TERM GOAL #4   Title Patient will be able to ambulate for greater than 20 minutes with pain no greater than 2/10 to allow her to walk her dog.    Status Partially Met                   Plan - 09/15/20 1740     Clinical Impression Statement Pt arrives with no improvements and c/o not sleeping well the night after PT, but conflicting report of having no pain during the day. She appears to have a hypomobile L SIJ with testing, but c/o pain in L knee with attempts at LAD to improve mobility. Pt not able to tolerate gentle L sacral mobs, but no TTP in L piriformis or surrounding soft tissue. She demonstrates significant weakness in L hip/glutes with supine MET abd initially that improved post sidelying MET in hip ER. Finished session with K-tape over sacrum in X pattern. No change in pain post session    PT Treatment/Interventions ADLs/Self Care Home Management;Electrical Stimulation;Iontophoresis 41m/ml Dexamethasone;Moist Heat;Ultrasound;Gait training;Stair training;Functional mobility training;Therapeutic activities;Therapeutic exercise;Balance training;Patient/family education;Manual techniques;Passive range of motion;Dry needling    PT Next Visit Plan pt seeing MD on Monday 6/27    PT Home Exercise Plan Access Code: [email or web address]   Consulted and Agree with Plan of Care Patient             Patient will benefit from skilled therapeutic intervention in order to improve the following deficits and impairments:  Difficulty walking, Decreased range of motion, Decreased activity tolerance, Pain, Decreased balance, Impaired flexibility, Decreased strength  Visit Diagnosis: Chronic pain of left knee  Muscle weakness (generalized)  Difficulty in walking, not elsewhere classified  Stiffness of left knee, not elsewhere classified     Problem List Patient Active Problem List   Diagnosis Date Noted    Menorrhagia with regular cycle 06/15/2020   Acute pain of left  lower extremity 01/17/2020   Chronic hypertension 06/20/2016   Postpartum care following vaginal delivery 06/20/2016   GDM (gestational diabetes mellitus) 04/09/2016   Uterine fibroid 02/09/2016   Chronic hypertension during pregnancy, antepartum 10/31/2015    Izell Whatcom, PT, DPT 09/15/2020, 5:46 PM  [place]. [place], [place], [address] Phone: [phone]   Fax:  [phone]  Name: Nichole Green MRN: [id number] Date of Birth: 19-Dec-1980

## 2020-09-20 ENCOUNTER — Telehealth: Payer: Self-pay

## 2020-09-20 ENCOUNTER — Ambulatory Visit: Payer: Medicaid Other

## 2020-09-20 ENCOUNTER — Ambulatory Visit: Payer: Medicaid Other | Admitting: Physical Therapy

## 2020-09-20 NOTE — Telephone Encounter (Signed)
Spoke with Nichole Green, regarding missed visit today. She did see her doctor yesterday who recommended an MRI. He advised her not to return to therapy if not making progress or helping, so canceled Thursday's visit. Let pt know she can return should she want to in the future, just to call.   Phill Myron. Yvette Rack, PT, DPT

## 2020-09-22 ENCOUNTER — Ambulatory Visit: Payer: Medicaid Other | Admitting: Physical Therapy

## 2020-09-25 ENCOUNTER — Emergency Department (HOSPITAL_COMMUNITY)
Admission: EM | Admit: 2020-09-25 | Discharge: 2020-09-26 | Disposition: A | Discharge status: Home / Self Care | Payer: Medicaid Other | Attending: Emergency Medicine | Admitting: Emergency Medicine

## 2020-09-25 ENCOUNTER — Emergency Department (HOSPITAL_COMMUNITY): Payer: Medicaid Other

## 2020-09-25 DIAGNOSIS — Z87891 Personal history of nicotine dependence: Secondary | ICD-10-CM | POA: Insufficient documentation

## 2020-09-25 DIAGNOSIS — N9489 Other specified conditions associated with female genital organs and menstrual cycle: Secondary | ICD-10-CM | POA: Insufficient documentation

## 2020-09-25 DIAGNOSIS — R0682 Tachypnea, not elsewhere classified: Secondary | ICD-10-CM | POA: Insufficient documentation

## 2020-09-25 DIAGNOSIS — R064 Hyperventilation: Secondary | ICD-10-CM | POA: Insufficient documentation

## 2020-09-25 DIAGNOSIS — I10 Essential (primary) hypertension: Secondary | ICD-10-CM | POA: Insufficient documentation

## 2020-09-25 DIAGNOSIS — R1084 Generalized abdominal pain: Secondary | ICD-10-CM | POA: Insufficient documentation

## 2020-09-25 DIAGNOSIS — R112 Nausea with vomiting, unspecified: Secondary | ICD-10-CM | POA: Insufficient documentation

## 2020-09-25 DIAGNOSIS — R109 Unspecified abdominal pain: Secondary | ICD-10-CM

## 2020-09-25 IMAGING — DX DG CHEST 1V PORT
1 series · 1 of 1 positions shown · non-contrast
Comparison: Radiograph [DATE]

CLINICAL DATA: Abdominal pain.  Vomiting.

EXAM:
PORTABLE CHEST 1 VIEW

[chest]
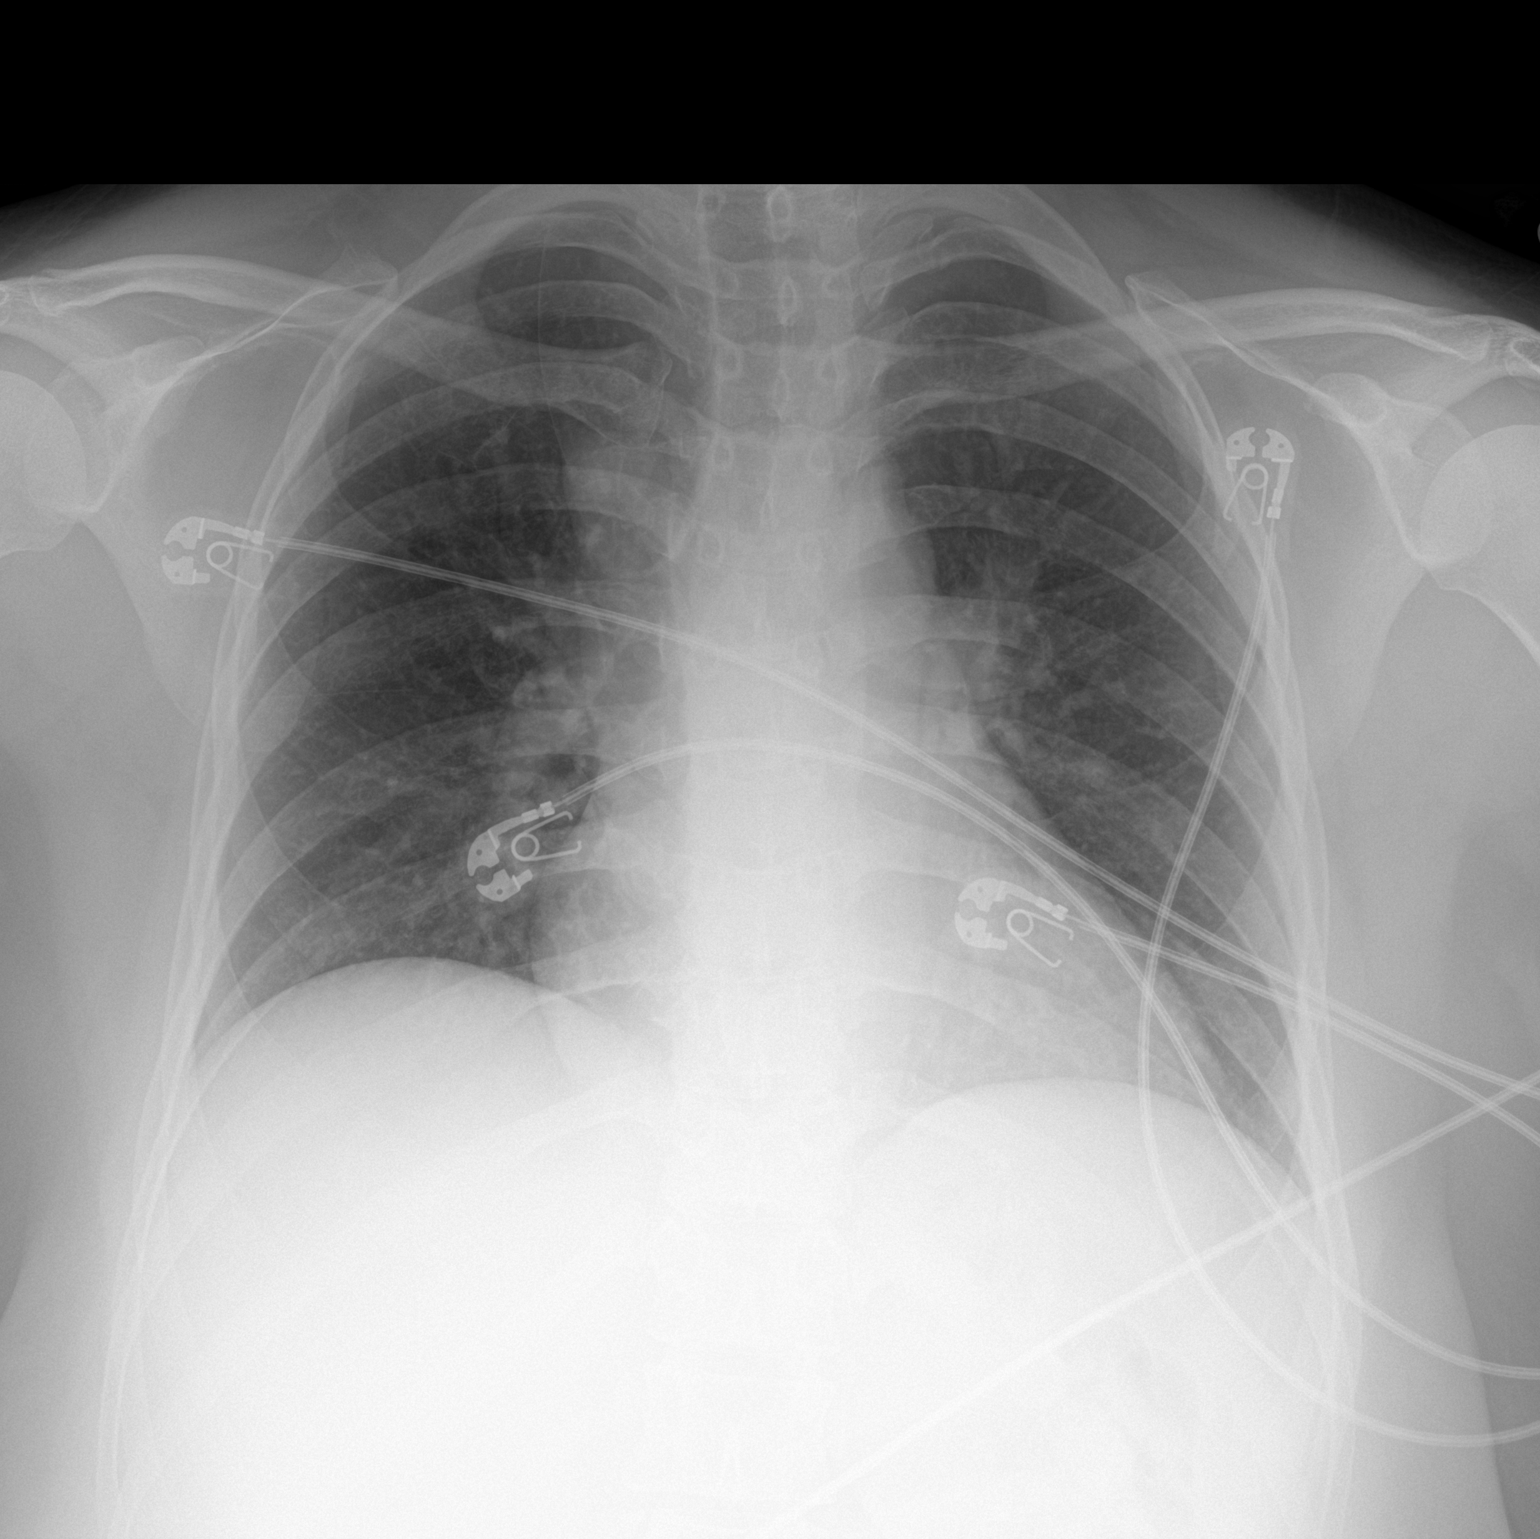

[1 of 1 positions shown; findings below may reference images not displayed]

FINDINGS: Lung volumes are low. Normal heart size and mediastinal contours for
technique. Azygos fissure again seen. No focal airspace disease,
pulmonary edema, significant pleural effusion or pneumothorax. No
free air in the upper abdomen. No acute osseous abnormalities are
seen.
IMPRESSION: Low lung volumes without acute abnormality.

## 2020-09-25 MED ORDER — SODIUM CHLORIDE 0.9 % IV BOLUS
1000.0000 mL | Freq: Once | INTRAVENOUS | Status: AC
  Administered 2020-09-25: 1000 mL via INTRAVENOUS

## 2020-09-25 MED ORDER — ONDANSETRON HCL 4 MG/2ML IJ SOLN
4.0000 mg | Freq: Once | INTRAMUSCULAR | Status: AC
  Administered 2020-09-25: 4 mg via INTRAVENOUS
  Filled 2020-09-25: qty 2

## 2020-09-25 MED ORDER — HYDROMORPHONE HCL 1 MG/ML IJ SOLN
1.0000 mg | Freq: Once | INTRAMUSCULAR | Status: AC
  Administered 2020-09-25: 1 mg via INTRAVENOUS
  Filled 2020-09-25: qty 1

## 2020-09-25 MED ORDER — METOCLOPRAMIDE HCL 5 MG/ML IJ SOLN
10.0000 mg | Freq: Once | INTRAMUSCULAR | Status: AC
  Administered 2020-09-25: 10 mg via INTRAVENOUS
  Filled 2020-09-25: qty 2

## 2020-09-25 MED ORDER — ONDANSETRON 4 MG PO TBDP: 4.0000 mg | ORAL_TABLET | Freq: Once | ORAL | Status: DC

## 2020-09-25 NOTE — ED Provider Notes (Signed)
Emergency Medicine Provider Triage Evaluation Note  Nichole Green , a 40 y.o. female  was evaluated in triage.  Pt complains of upper abdominal pain with associated NBNB emesis since yesterday.  Patient very ill-appearing in triage, unable to speak in full sentences, tachypneic, head dropped back in the chair, keeps saying "body numb, body numb".  Not following commands  Review of Systems  Positive: Weakness, abdominal pain, nausea, vomiting, shortness of breath, chest pain Negative: Diarrhea, fevers, chills  Physical Exam  BP (!) 125/55 (BP Location: Right Arm)   Pulse 65   Temp 98 F (36.7 C) (Oral)   Resp 20   SpO2 100%  Gen:   Awake, acute distress secondary to shortness of breath and chest/abdominal pain Resp:  Tachypneic, increased effort, lungs CTA B MSK:   Moves extremities without difficulty, globally weak on exam Other:  Alert and oriented, PERRL, EOMI, ill-appearing  Medical Decision Making  Medically screening exam initiated at 10:55 PM.  Appropriate orders placed.  Nichole Green was informed that the remainder of the evaluation will be completed by another provider, this initial triage assessment does not replace that evaluation, and the importance of remaining in the ED until their evaluation is complete.  Patient ill-appearing and unable to provide much contact for her illness due to acuity of her shortness of breath and severity of her pain, will proceed with broad work-up and dissection study given concern for chest pain, abdominal pain, and sudden onset shortness of breath.  Patient to be transported next, assigned to room 25 and transported directly from triage into blue pod.  This chart was dictated using voice recognition software, Dragon. Despite the best efforts of this provider to proofread and correct errors, errors may still occur which can change documentation meaning.    Aura Dials 09/25/20 2258    Arnaldo Natal, MD 09/26/20 1539

## 2020-09-25 NOTE — ED Triage Notes (Signed)
Pt c/o abd pain/emesis x2 days, chills, yellow-green emesis approx 5-7 times, states she feels she cannot breathe. 8/10 pain from throat area to URQ, as gestured by pt. Denies pertinent hx. Pt tearful in triage, speaking in full sentences but slow to respond due to episodes of dry heaving.

## 2020-09-25 NOTE — ED Provider Notes (Signed)
[place] EMERGENCY DEPARTMENT Provider Note   CSN: [id number] Arrival date & time: 09/25/20  2239     History Chief Complaint  Patient presents with   Emesis   Abdominal Pain    Nichole Green is a 40 y.o. female.  Patient presents to the emergency department for evaluation of chest and abdominal pain with associated nausea, vomiting.  Symptoms began yesterday.  Patient reports repeated emesis throughout the day today, now dry heaves.  Patient with persistent pain in the central upper abdomen/lower chest that radiates to the back.      Past Medical History:  Diagnosis Date   Headache    only when B/P elevated    Hypertension    Infection    UTI   Pregnancy induced hypertension     Patient Active Problem List   Diagnosis Date Noted   Menorrhagia with regular cycle 06/15/2020   Acute pain of left lower extremity 01/17/2020   Chronic hypertension 06/20/2016   Postpartum care following vaginal delivery 06/20/2016   GDM (gestational diabetes mellitus) 04/09/2016   Uterine fibroid 02/09/2016   Chronic hypertension during pregnancy, antepartum 10/31/2015    Past Surgical History:  Procedure Laterality Date   BREAST REDUCTION SURGERY     REDUCTION MAMMAPLASTY Bilateral 2010   ROBOTIC ASSISTED LAPAROSCOPIC OVARIAN CYSTECTOMY N/A 01/17/2016   Procedure: XI ROBOTIC ASSISTED LAPAROSCOPIC OVARIAN CYSTECTOMY;  Surgeon: Everitt Amber, MD;  Location: [facility];  Service: Gynecology;  Laterality: N/A;     OB History     Gravida  3   Para  3   Term  2   Preterm  1   AB  0   Living  3      SAB  0   IAB  0   Ectopic  0   Multiple  0   Live Births  3        Obstetric Comments  Elevated BP with preg         Family History  Problem Relation Age of Onset   Hypertension Mother    Diabetes Mother     Social History   Tobacco Use   Smoking status: Former    Years: 2.00    Pack years: 0.00    Types: Cigarettes    Quit date: 10/25/2015     Years since quitting: 4.9   Smokeless tobacco: Never   Tobacco comments:    hookah  Vaping Use   Vaping Use: Never used  Substance Use Topics   Alcohol use: No   Drug use: No    Home Medications Prior to Admission medications   Medication Sig Start Date End Date Taking? Authorizing Provider  hyoscyamine (LEVSIN SL) 0.125 MG SL tablet Place 1 tablet (0.125 mg total) under the tongue every 4 (four) hours as needed for cramping (abdominal pain). 09/26/20  Yes Edy Belt, Gwenyth Allegra, MD  ondansetron (ZOFRAN) 4 MG tablet Take 1 tablet (4 mg total) by mouth every 6 (six) hours as needed for nausea or vomiting. 09/26/20  Yes Haig Gerardo, Gwenyth Allegra, MD  amoxicillin-clavulanate (AUGMENTIN) 875-125 MG tablet Take 1 tablet by mouth 2 (two) times daily. Patient not taking: Reported on 06/15/2020 01/12/20   [provider]  calcium-vitamin D (OSCAL WITH D) 250-125 MG-UNIT tablet Take 1 tablet by mouth daily. Patient not taking: Reported on 06/15/2020    [provider]  cyclobenzaprine (FLEXERIL) 10 MG tablet Take 1 tablet (10 mg total) by mouth 2 (two) times daily as needed for  muscle spasms. Patient not taking: Reported on 06/15/2020 01/17/20   Charlann Lange, PA-C  fluconazole (DIFLUCAN) 150 MG tablet TAKE ONE TABLET BY MOUTH AS A ONE-TIME DOSE Patient not taking: Reported on 06/15/2020 03/23/20   Griffin Basil, MD  HYDROcodone-acetaminophen (NORCO/VICODIN) 5-325 MG tablet Take 1 tablet by mouth every 4 (four) hours as needed for severe pain. Patient not taking: Reported on 06/15/2020 01/17/20   Charlann Lange, PA-C  ibuprofen (ADVIL) 600 MG tablet Take 1 tablet (600 mg total) by mouth every 6 (six) hours as needed. Patient not taking: Reported on 06/15/2020 01/17/20   Charlann Lange, PA-C  megestrol (MEGACE) 40 MG tablet Take 1 tablet (40 mg total) by mouth 2 (two) times daily. Can increase to two tablets twice a day in the event of heavy bleeding 08/03/20   Constant, Peggy, MD   oxymetazoline (AFRIN) 0.05 % nasal spray Place 1 spray into both nostrils 2 (two) times daily as needed for congestion. Patient not taking: Reported on 06/15/2020    [provider]  SUMAtriptan (IMITREX) 100 MG tablet Take 1 tablet by mouth every 12 (twelve) hours as needed. Patient not taking: Reported on 06/15/2020 12/28/19   [provider]    Allergies    Patient has no known allergies.  Review of Systems   Review of Systems  Cardiovascular:  Positive for chest pain.  Gastrointestinal:  Positive for abdominal pain, nausea and vomiting.  All other systems reviewed and are negative.  Physical Exam Updated Vital Signs BP 101/62   Pulse 60   Temp (!) 97.1 F (36.2 C) (Oral)   Resp 14   SpO2 96%   Physical Exam Vitals and nursing note reviewed.  Constitutional:      General: She is in acute distress.     Appearance: Normal appearance. She is well-developed.  HENT:     Head: Normocephalic and atraumatic.     Right Ear: Hearing normal.     Left Ear: Hearing normal.     Nose: Nose normal.  Eyes:     Conjunctiva/sclera: Conjunctivae normal.     Pupils: Pupils are equal, round, and reactive to light.  Cardiovascular:     Rate and Rhythm: Regular rhythm.     Heart sounds: S1 normal and S2 normal. No murmur heard.   No friction rub. No gallop.  Pulmonary:     Effort: Pulmonary effort is normal. Tachypnea (hyperventilating) present. No respiratory distress.     Breath sounds: Normal breath sounds.  Chest:     Chest wall: No tenderness.  Abdominal:     General: Bowel sounds are normal.     Palpations: Abdomen is soft.     Tenderness: There is abdominal tenderness in the epigastric area. There is no guarding or rebound. Negative signs include Murphy's sign and McBurney's sign.     Hernia: No hernia is present.  Musculoskeletal:        General: Normal range of motion.     Cervical back: Normal range of motion and neck supple.  Skin:    General: Skin is  warm and dry.     Findings: No rash.  Neurological:     Mental Status: She is alert and oriented to person, place, and time.     GCS: GCS eye subscore is 4. GCS verbal subscore is 5. GCS motor subscore is 6.     Cranial Nerves: No cranial nerve deficit.     Sensory: No sensory deficit.     Coordination: Coordination  normal.  Psychiatric:        Speech: Speech normal.        Behavior: Behavior normal.        Thought Content: Thought content normal.    ED Results / Procedures / Treatments   Labs (all labs ordered are listed, but only abnormal results are displayed) Labs Reviewed  COMPREHENSIVE METABOLIC PANEL - Abnormal; Notable for the following components:      Result Value   Potassium 3.3 (*)    CO2 20 (*)    Glucose, Bld 107 (*)    Calcium 8.4 (*)    Total Protein 6.3 (*)    All other components within normal limits  LACTIC ACID, PLASMA - Abnormal; Notable for the following components:   Lactic Acid, Venous 2.2 (*)    All other components within normal limits  URINE CULTURE  LIPASE, BLOOD  CBC WITH DIFFERENTIAL/PLATELET  LACTIC ACID, PLASMA  URINALYSIS, ROUTINE W REFLEX MICROSCOPIC  I-STAT BETA HCG BLOOD, ED ([facility], [facility], [facility] ONLY)  TROPONIN I (HIGH SENSITIVITY)  TROPONIN I (HIGH SENSITIVITY)    EKG EKG Interpretation  Date/Time:  Sunday September 25 2020 22:49:02 EDT Ventricular Rate:  84 PR Interval:  198 QRS Duration: 64 QT Interval:  388 QTC Calculation: 458 R Axis:   58 Text Interpretation: Normal sinus rhythm Nonspecific T wave abnormality Abnormal ECG Confirmed by Orpah Greek ([id number]) on 09/25/2020 11:13:47 PM  Radiology DG Chest Port 1 View  Result Date: 09/26/2020 CLINICAL DATA:  Abdominal pain.  Vomiting. EXAM: PORTABLE CHEST 1 VIEW COMPARISON:  Radiograph 01/16/2020 FINDINGS: Lung volumes are low. Normal heart size and mediastinal contours for technique. Azygos fissure again seen. No focal airspace disease, pulmonary edema, significant pleural effusion  or pneumothorax. No free air in the upper abdomen. No acute osseous abnormalities are seen. IMPRESSION: Low lung volumes without acute abnormality. Electronically Signed   By: Keith Rake M.D.   On: 09/26/2020 00:01   CT Angio Chest/Abd/Pel for Dissection W and/or W/WO  Result Date: 09/26/2020 CLINICAL DATA:  40 year old female with chest and abdominal pain. Vomiting for 2 days. EXAM: CT ANGIOGRAPHY CHEST, ABDOMEN AND PELVIS TECHNIQUE: Non-contrast CT of the chest was initially obtained. Multidetector CT imaging through the chest, abdomen and pelvis was performed using the standard protocol during bolus administration of intravenous contrast. Multiplanar reconstructed images and MIPs were obtained and reviewed to evaluate the vascular anatomy. CONTRAST:  156mL OMNIPAQUE IOHEXOL 350 MG/ML SOLN COMPARISON:  CT Chest, Abdomen, and Pelvis 01/16/2020. Portable chest radiograph 09/25/2020. FINDINGS: CTA CHEST FINDINGS Cardiovascular: No calcified atherosclerosis identified in the chest. Mild cardiac pulsation artifact. Normal thoracic aorta. No dissection or aneurysm. Incidental prominent left side supreme intercostal and right side azygos veins (normal variant). Proximal great vessels appear normal. Central pulmonary arteries are also opacified and appear to be patent. No cardiomegaly or pericardial effusion. Mediastinum/Nodes: Stable, negative.  No lymphadenopathy. Lungs/Pleura: Right upper lobe azygos fissure, normal variant. Major airways are patent. Stable to mildly lower lung volumes compared to last year. Enhancing mild dependent atelectasis in both lower lobes. Otherwise both lungs are stable and clear. No pleural effusion. Musculoskeletal: Stable. No acute osseous abnormality identified. Subtle dextroconvex thoracic scoliosis. Review of the MIP images confirms the above findings. CTA ABDOMEN AND PELVIS FINDINGS VASCULAR Stable and normal abdominal aorta. No dissection or aneurysm. Major aortic branches  appear patent and within normal limits. Normal iliac arteries and visible proximal femoral arteries. Review of the MIP images confirms the above findings. NON-VASCULAR Hepatobiliary:  Negative liver and gallbladder. Pancreas: Negative. Spleen: Negative. Adrenals/Urinary Tract: Stable adrenal glands, small 17 mm left adrenal nodule with intermediate density. Right adrenal is normal. Kidneys appear stable, nonobstructed, and enhance symmetrically. No nephrolithiasis is evident. There is early renal contrast excretion on the right. Ureters are decompressed. Trace excreted contrast also layering in the urinary bladder which is otherwise negative. Stomach/Bowel: Redundant sigmoid colon, but otherwise negative distal large bowel. Increased retained stool from the right colon to the descending colon compared to last year. No large bowel inflammation. Normal appendix suspected on coronal image 37. Negative terminal ileum. No dilated small bowel. Stable chronic postoperative changes to the stomach, perhaps prior gastric sleeve. Otherwise negative stomach and duodenum. No free air, free fluid, mesenteric inflammation. Lymphatic: No lymphadenopathy. Reproductive: Stable. Small calcified dorsal uterine fibroids and/or dystrophic ovarian calcification unchanged. Other: No pelvic free fluid. Musculoskeletal: Stable.  No acute osseous abnormality identified. Review of the MIP images confirms the above findings. IMPRESSION: 1. Normal aorta.  Negative for dissection or aneurysm. 2. No acute or inflammatory process identified in the chest, abdomen, or pelvis. 3. A small 17 mm left adrenal nodule is stable since October and probably benign. Consider follow-up Adrenal Protocol Abdomen CT (without and with contrast) in 1 year. This recommendation follows ACR consensus guidelines: Management of Incidental Adrenal Masses: A White Paper of the ACR Incidental Findings Committee. J Am Coll Radiol 2017;14:1038-1044. Electronically Signed   By:  Genevie Ann M.D.   On: 09/26/2020 04:35    Procedures Procedures   Medications Ordered in ED Medications  ondansetron (ZOFRAN-ODT) disintegrating tablet 4 mg (4 mg Oral Not Given 09/25/20 2343)  sodium chloride 0.9 % bolus 1,000 mL (0 mLs Intravenous Stopped 09/26/20 0147)  HYDROmorphone (DILAUDID) injection 1 mg (1 mg Intravenous Given 09/25/20 2340)  ondansetron (ZOFRAN) injection 4 mg (4 mg Intravenous Given 09/25/20 2341)  metoCLOPramide (REGLAN) injection 10 mg (10 mg Intravenous Given 09/25/20 2343)  iohexol (OMNIPAQUE) 350 MG/ML injection 100 mL (100 mLs Intravenous Contrast Given 09/26/20 0409)    ED Course  I have reviewed the triage vital signs and the nursing notes.  Pertinent labs & imaging results that were available during my care of the patient were reviewed by me and considered in my medical decision making (see chart for details).    MDM Rules/Calculators/A&P                          Patient presents for evaluation of chest and abdominal pain with nausea and vomiting.  Symptoms began yesterday.  She has had intractable nausea and vomiting and now experiencing severe pain as well.  Patient did appear uncomfortable at arrival.  She has significantly improved with hydration, antiemetics and analgesia.  Work-up has been very reassuring.  Labs are entirely normal.  Cardiac evaluation normal.  CT chest abdomen and pelvis without pathology.  Pain was essentially epigastric and chest in nature.  No evidence of esophageal rupture on CT.  No focal right upper quadrant tenderness to suggest gallbladder disease.  Will discharge with symptomatic treatment, follow-up as needed. Final Clinical Impression(s) / ED Diagnoses Final diagnoses:  Nausea and vomiting, intractability of vomiting not specified, unspecified vomiting type  Generalized abdominal pain    Rx / DC Orders ED Discharge Orders          Ordered    hyoscyamine (LEVSIN SL) 0.125 MG SL tablet  Every 4 hours PRN        09/26/20  0457     ondansetron (ZOFRAN) 4 MG tablet  Every 6 hours PRN        09/26/20 0457             Orpah Greek, MD 09/26/20 [phone]

## 2020-09-26 ENCOUNTER — Emergency Department (HOSPITAL_COMMUNITY): Payer: Medicaid Other

## 2020-09-26 LAB — CBC WITH DIFFERENTIAL/PLATELET
Abs Immature Granulocytes: 0.04 10*3/uL (ref 0.00–0.07)
Basophils Absolute: 0 10*3/uL (ref 0.0–0.1)
Basophils Relative: 0 %
Eosinophils Absolute: 0 10*3/uL (ref 0.0–0.5)
Eosinophils Relative: 0 %
HCT: 39.9 % (ref 36.0–46.0)
Hemoglobin: 13.3 g/dL (ref 12.0–15.0)
Immature Granulocytes: 0 %
Lymphocytes Relative: 29 %
Lymphs Abs: 2.9 10*3/uL (ref 0.7–4.0)
MCH: 26.7 pg (ref 26.0–34.0)
MCHC: 33.3 g/dL (ref 30.0–36.0)
MCV: 80.1 fL (ref 80.0–100.0)
Monocytes Absolute: 0.6 10*3/uL (ref 0.1–1.0)
Monocytes Relative: 5 %
Neutro Abs: 6.7 10*3/uL (ref 1.7–7.7)
Neutrophils Relative %: 66 %
Platelets: 227 10*3/uL (ref 150–400)
RBC: 4.98 MIL/uL (ref 3.87–5.11)
RDW: 14.1 % (ref 11.5–15.5)
WBC: 10.3 10*3/uL (ref 4.0–10.5)
nRBC: 0 % (ref 0.0–0.2)

## 2020-09-26 LAB — I-STAT BETA HCG BLOOD, ED (MC, WL, AP ONLY): I-stat hCG, quantitative: <5 m[IU]/mL (ref <5)

## 2020-09-26 LAB — COMPREHENSIVE METABOLIC PANEL
ALT: 12 U/L (ref 0–44)
AST: 16 U/L (ref 15–41)
Albumin: 3.6 g/dL (ref 3.5–5.0)
Alkaline Phosphatase: 39 U/L (ref 38–126)
Anion gap: 7 (ref 5–15)
BUN: 13 mg/dL (ref 6–20)
CO2: 20 mmol/L — ABNORMAL LOW (ref 22–32)
Calcium: 8.4 mg/dL — ABNORMAL LOW (ref 8.9–10.3)
Chloride: 110 mmol/L (ref 98–111)
Creatinine, Ser: 0.61 mg/dL (ref 0.44–1.00)
GFR, Estimated: >60 mL/min (ref >60)
Glucose, Bld: 107 mg/dL — ABNORMAL HIGH (ref 70–99)
Potassium: 3.3 mmol/L — ABNORMAL LOW (ref 3.5–5.1)
Sodium: 137 mmol/L (ref 135–145)
Total Bilirubin: 0.6 mg/dL (ref 0.3–1.2)
Total Protein: 6.3 g/dL — ABNORMAL LOW (ref 6.5–8.1)

## 2020-09-26 LAB — TROPONIN I (HIGH SENSITIVITY)
Troponin I (High Sensitivity): <2 ng/L (ref <18)
Troponin I (High Sensitivity): <2 ng/L (ref <18)

## 2020-09-26 LAB — LIPASE, BLOOD: Lipase: 35 U/L (ref 11–51)

## 2020-09-26 LAB — LACTIC ACID, PLASMA
Lactic Acid, Venous: 2.2 mmol/L (ref 0.5–1.9)
Lactic Acid, Venous: 1.3 mmol/L (ref 0.5–1.9)

## 2020-09-26 IMAGING — CT CT ANGIO CHEST-ABD-PELV FOR DISSECTION W/ AND WO/W CM
2 of 7 series · 13 of 46 positions shown, 15 images · non-contrast
Comparison: CT Chest, Abdomen, and Pelvis [DATE]. Portable
chest radiograph [DATE].

CLINICAL DATA: 40-year-old female with chest and abdominal pain.
Vomiting for 2 days.

EXAM:
CT ANGIOGRAPHY CHEST, ABDOMEN AND PELVIS
TECHNIQUE: Non-contrast CT of the chest was initially obtained.

[Series 6: arterial · axial · arterial · 0.80mm/px · z∈[+655,+1215]mm · 10 of 314 slices shown, 12 images]
[im 17/314  soft-tissue]
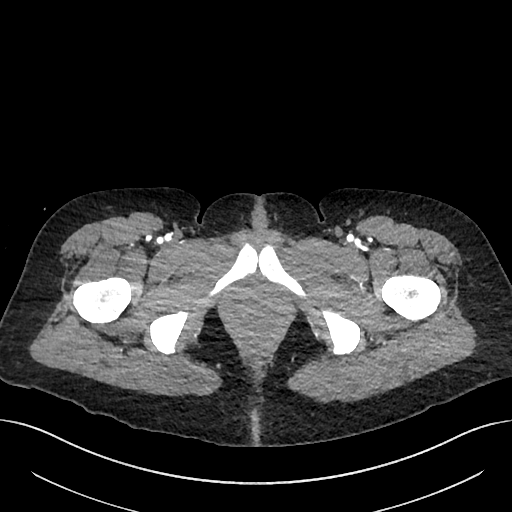
[im 17/314  bone]
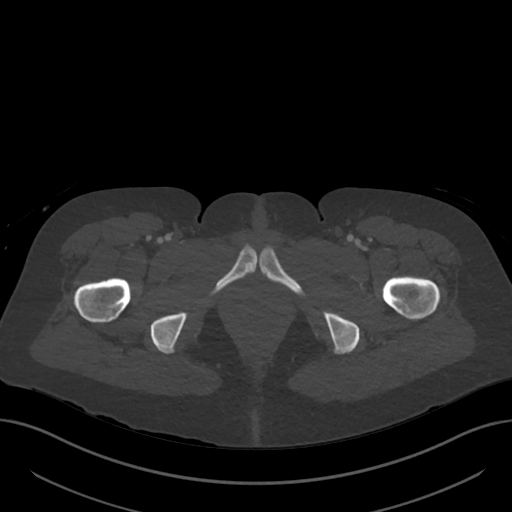
[im 50/314  soft-tissue]
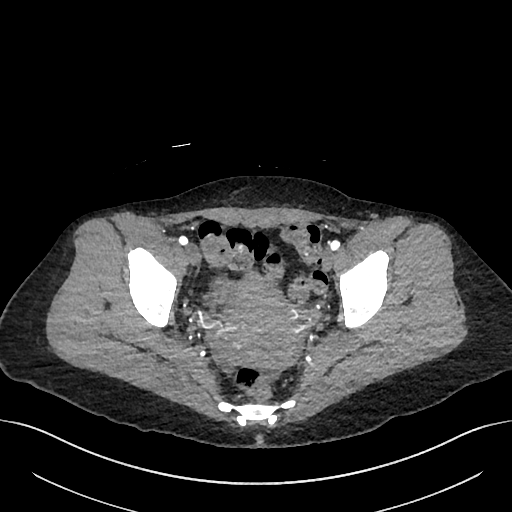
[im 83/314  soft-tissue]
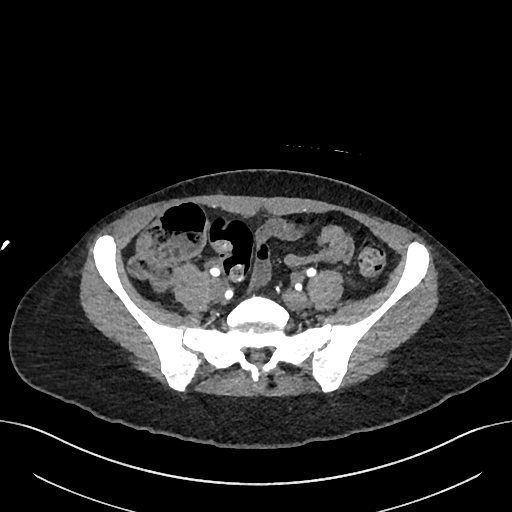
[im 116/314  soft-tissue]
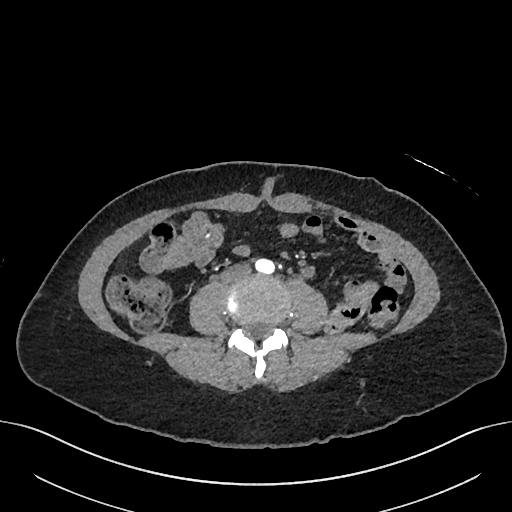
[im 149/314  soft-tissue]
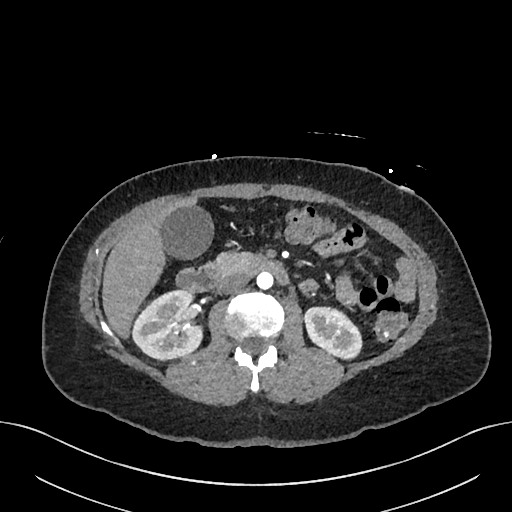
[im 165/314  soft-tissue]
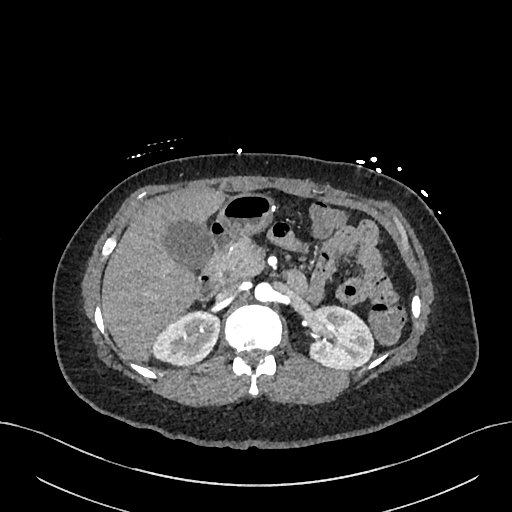
[im 198/314  soft-tissue]
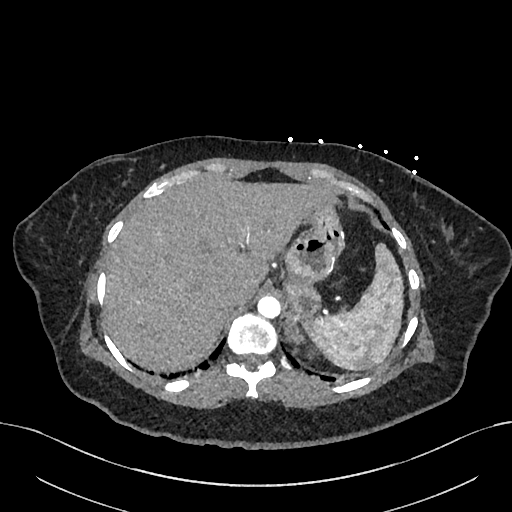
[im 231/314  soft-tissue]
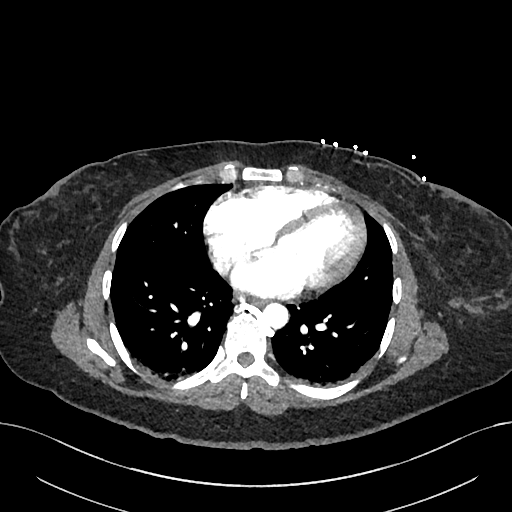
[im 264/314  soft-tissue]
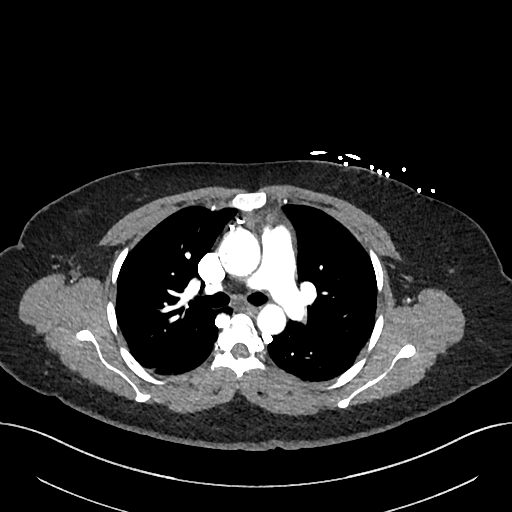
[im 264/314  bone]
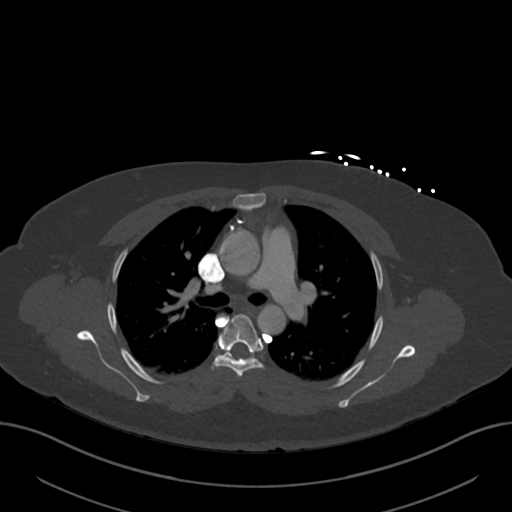
[im 297/314  soft-tissue]
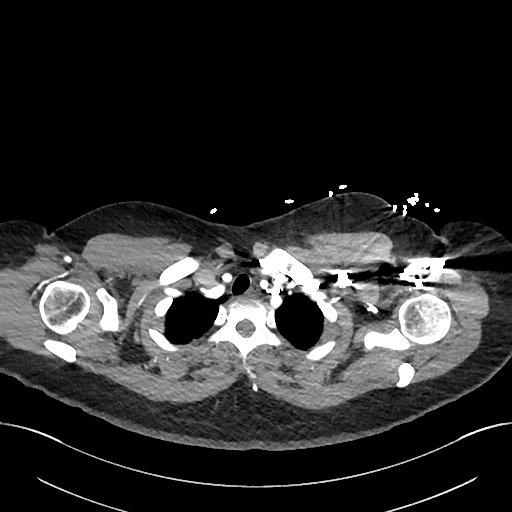

[Series 9: cor · coronal · 0.68mm/px · 3 of 115 slices shown]
[im 29/115  soft-tissue]
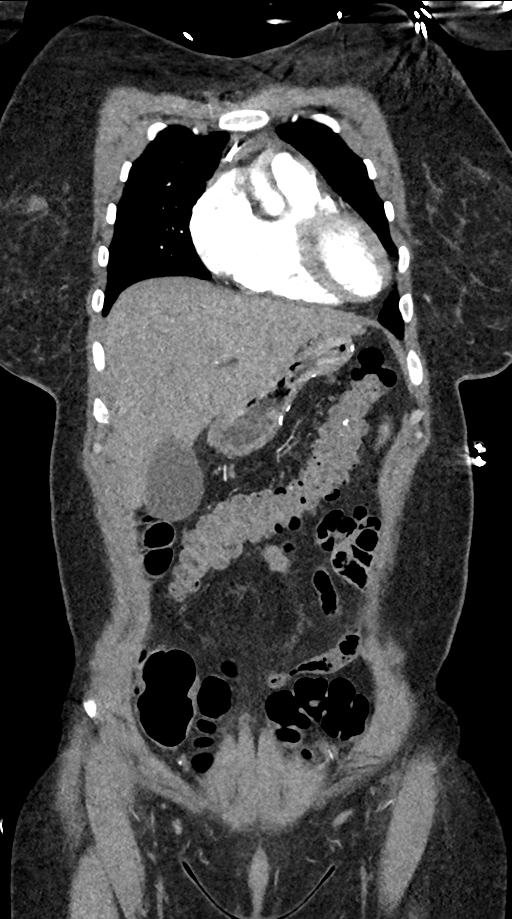
[im 58/115  soft-tissue]
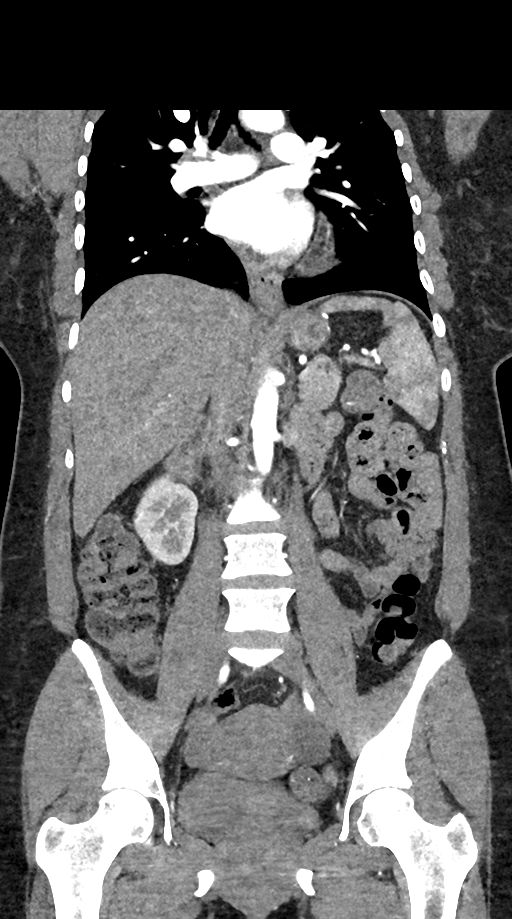
[im 86/115  soft-tissue]
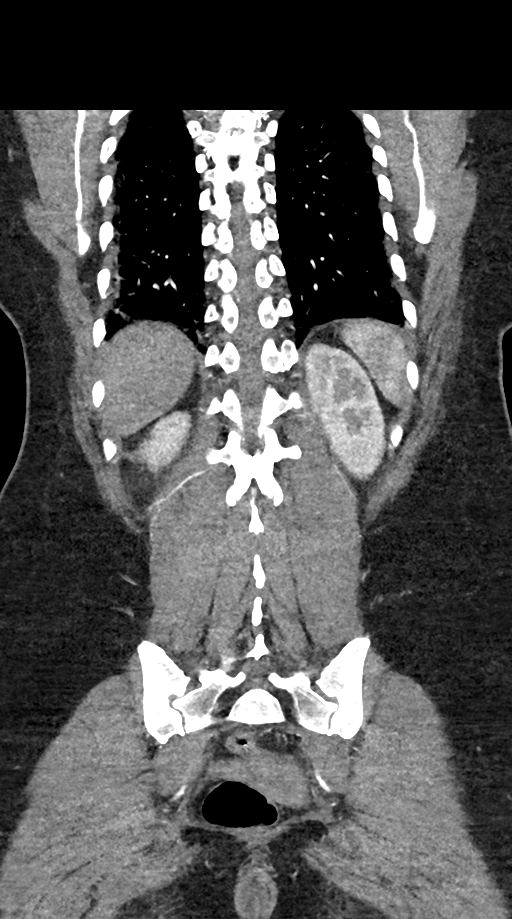

[13 of 46 positions shown; findings below may reference images not displayed]

Multidetector CT imaging through the chest, abdomen and pelvis was
performed using the standard protocol during bolus administration of
intravenous contrast. Multiplanar reconstructed images and MIPs were
obtained and reviewed to evaluate the vascular anatomy.

CONTRAST:  100mL OMNIPAQUE IOHEXOL 350 MG/ML SOLN
FINDINGS: CTA CHEST FINDINGS

Cardiovascular: No calcified atherosclerosis identified in the
chest. Mild cardiac pulsation artifact. Normal thoracic aorta. No
dissection or aneurysm. Incidental prominent left side supreme
intercostal and right side azygos veins (normal variant). Proximal
great vessels appear normal. Central pulmonary arteries are also
opacified and appear to be patent.

No cardiomegaly or pericardial effusion.

Mediastinum/Nodes: Stable, negative.  No lymphadenopathy.

Lungs/Pleura: Right upper lobe azygos fissure, normal variant. Major
airways are patent. Stable to mildly lower lung volumes compared to
last year. Enhancing mild dependent atelectasis in both lower lobes.
Otherwise both lungs are stable and clear. No pleural effusion.

Musculoskeletal: Stable. No acute osseous abnormality identified.
Subtle dextroconvex thoracic scoliosis.

Review of the MIP images confirms the above findings.

CTA ABDOMEN AND PELVIS FINDINGS

VASCULAR

Stable and normal abdominal aorta. No dissection or aneurysm. Major
aortic branches appear patent and within normal limits. Normal iliac
arteries and visible proximal femoral arteries.

Review of the MIP images confirms the above findings.

NON-VASCULAR

Hepatobiliary: Negative liver and gallbladder.

Pancreas: Negative.

Spleen: Negative.

Adrenals/Urinary Tract: Stable adrenal glands, small 17 mm left
adrenal nodule with intermediate density. Right adrenal is normal.

Kidneys appear stable, nonobstructed, and enhance symmetrically. No
nephrolithiasis is evident. There is early renal contrast excretion
on the right. Ureters are decompressed. Trace excreted contrast also
layering in the urinary bladder which is otherwise negative.

Stomach/Bowel: Redundant sigmoid colon, but otherwise negative
distal large bowel. Increased retained stool from the right colon to
the descending colon compared to last year. No large bowel
inflammation. Normal appendix suspected on coronal image 37.
Negative terminal ileum. No dilated small bowel.

Stable chronic postoperative changes to the stomach, perhaps prior
gastric sleeve. Otherwise negative stomach and duodenum. No free
air, free fluid, mesenteric inflammation.

Lymphatic: No lymphadenopathy.

Reproductive: Stable. Small calcified dorsal uterine fibroids and/or
dystrophic ovarian calcification unchanged.

Other: No pelvic free fluid.

Musculoskeletal: Stable.  No acute osseous abnormality identified.

Review of the MIP images confirms the above findings.
IMPRESSION: 1. Normal aorta.  Negative for dissection or aneurysm.
2. No acute or inflammatory process identified in the chest,
abdomen, or pelvis.
3. A small 17 mm left adrenal nodule is stable since [REDACTED] and
probably benign.
Consider follow-up Adrenal Protocol Abdomen CT (without and with
contrast) in 1 year. This recommendation follows ACR consensus
guidelines: Management of Incidental Adrenal Masses: A White Paper
of the ACR Incidental Findings Committee. [HOSPITAL]

## 2020-09-26 MED ORDER — HYOSCYAMINE SULFATE 0.125 MG SL SUBL: 0.1250 mg | SUBLINGUAL_TABLET | SUBLINGUAL | 0 refills | Status: DC | PRN

## 2020-09-26 MED ORDER — IOHEXOL 350 MG/ML SOLN
100.0000 mL | Freq: Once | INTRAVENOUS | Status: AC | PRN
  Administered 2020-09-26: 100 mL via INTRAVENOUS

## 2020-09-26 MED ORDER — ONDANSETRON HCL 4 MG PO TABS: 4.0000 mg | ORAL_TABLET | Freq: Four times a day (QID) | ORAL | 0 refills | Status: DC | PRN

## 2020-11-17 ENCOUNTER — Other Ambulatory Visit: Payer: Self-pay

## 2020-11-17 ENCOUNTER — Ambulatory Visit (HOSPITAL_COMMUNITY)
Admission: RE | Admit: 2020-11-17 | Discharge: 2020-11-17 | Disposition: A | Discharge status: Home / Self Care | Payer: Medicaid Other | Source: Ambulatory Visit | Attending: Orthopedic Surgery | Admitting: Orthopedic Surgery

## 2020-11-17 ENCOUNTER — Other Ambulatory Visit (HOSPITAL_COMMUNITY): Payer: Self-pay | Admitting: Orthopedic Surgery

## 2020-11-17 DIAGNOSIS — M7989 Other specified soft tissue disorders: Secondary | ICD-10-CM | POA: Diagnosis present

## 2020-11-17 DIAGNOSIS — M79662 Pain in left lower leg: Secondary | ICD-10-CM

## 2020-12-25 ENCOUNTER — Emergency Department (HOSPITAL_COMMUNITY): Payer: Medicaid Other

## 2020-12-25 ENCOUNTER — Emergency Department (HOSPITAL_COMMUNITY)
Admission: EM | Admit: 2020-12-25 | Discharge: 2020-12-25 | Disposition: A | Discharge status: Home / Self Care | Payer: Medicaid Other | Attending: Emergency Medicine | Admitting: Emergency Medicine

## 2020-12-25 ENCOUNTER — Encounter (HOSPITAL_COMMUNITY): Payer: Self-pay

## 2020-12-25 DIAGNOSIS — I10 Essential (primary) hypertension: Secondary | ICD-10-CM | POA: Diagnosis not present

## 2020-12-25 DIAGNOSIS — N9489 Other specified conditions associated with female genital organs and menstrual cycle: Secondary | ICD-10-CM | POA: Diagnosis not present

## 2020-12-25 DIAGNOSIS — Z87891 Personal history of nicotine dependence: Secondary | ICD-10-CM | POA: Diagnosis not present

## 2020-12-25 DIAGNOSIS — G43101 Migraine with aura, not intractable, with status migrainosus: Secondary | ICD-10-CM | POA: Diagnosis not present

## 2020-12-25 DIAGNOSIS — G43109 Migraine with aura, not intractable, without status migrainosus: Secondary | ICD-10-CM

## 2020-12-25 DIAGNOSIS — Z79899 Other long term (current) drug therapy: Secondary | ICD-10-CM | POA: Insufficient documentation

## 2020-12-25 DIAGNOSIS — G43809 Other migraine, not intractable, without status migrainosus: Secondary | ICD-10-CM | POA: Insufficient documentation

## 2020-12-25 DIAGNOSIS — R519 Headache, unspecified: Secondary | ICD-10-CM | POA: Diagnosis present

## 2020-12-25 LAB — DIFFERENTIAL
Abs Immature Granulocytes: 0.02 10*3/uL (ref 0.00–0.07)
Basophils Absolute: 0 10*3/uL (ref 0.0–0.1)
Basophils Relative: 0 %
Eosinophils Absolute: 0.1 10*3/uL (ref 0.0–0.5)
Eosinophils Relative: 1 %
Immature Granulocytes: 0 %
Lymphocytes Relative: 31 %
Lymphs Abs: 2.5 10*3/uL (ref 0.7–4.0)
Monocytes Absolute: 0.6 10*3/uL (ref 0.1–1.0)
Monocytes Relative: 7 %
Neutro Abs: 4.9 10*3/uL (ref 1.7–7.7)
Neutrophils Relative %: 61 %

## 2020-12-25 LAB — CBC
HCT: 43.9 % (ref 36.0–46.0)
Hemoglobin: 13.9 g/dL (ref 12.0–15.0)
MCH: 25.3 pg — ABNORMAL LOW (ref 26.0–34.0)
MCHC: 31.7 g/dL (ref 30.0–36.0)
MCV: 79.8 fL — ABNORMAL LOW (ref 80.0–100.0)
Platelets: 211 10*3/uL (ref 150–400)
RBC: 5.5 MIL/uL — ABNORMAL HIGH (ref 3.87–5.11)
RDW: 13.9 % (ref 11.5–15.5)
WBC: 8 10*3/uL (ref 4.0–10.5)
nRBC: 0 % (ref 0.0–0.2)

## 2020-12-25 LAB — COMPREHENSIVE METABOLIC PANEL
ALT: 12 U/L (ref 0–44)
AST: 21 U/L (ref 15–41)
Albumin: 4 g/dL (ref 3.5–5.0)
Alkaline Phosphatase: 55 U/L (ref 38–126)
Anion gap: 10 (ref 5–15)
BUN: 13 mg/dL (ref 6–20)
CO2: 21 mmol/L — ABNORMAL LOW (ref 22–32)
Calcium: 9.3 mg/dL (ref 8.9–10.3)
Chloride: 104 mmol/L (ref 98–111)
Creatinine, Ser: 0.65 mg/dL (ref 0.44–1.00)
GFR, Estimated: >60 mL/min (ref >60)
Glucose, Bld: 110 mg/dL — ABNORMAL HIGH (ref 70–99)
Potassium: 4.1 mmol/L (ref 3.5–5.1)
Sodium: 135 mmol/L (ref 135–145)
Total Bilirubin: 1.2 mg/dL (ref 0.3–1.2)
Total Protein: 6.9 g/dL (ref 6.5–8.1)

## 2020-12-25 LAB — CBG MONITORING, ED: Glucose-Capillary: 94 mg/dL (ref 70–99)

## 2020-12-25 LAB — I-STAT BETA HCG BLOOD, ED (MC, WL, AP ONLY): I-stat hCG, quantitative: <5 m[IU]/mL (ref <5)

## 2020-12-25 IMAGING — CT CT HEAD CODE STROKE
4 series · 17 of 47 positions shown, 19 images · non-contrast
Comparison: CT from [DATE]

CLINICAL DATA: Code stroke.  Initial evaluation for acute stroke.

EXAM:
CT HEAD WITHOUT CONTRAST
TECHNIQUE: Contiguous axial images were obtained from the base of the skull
through the vertex without intravenous contrast.

[Series 2: head wo · axial · 0.42mm/px · z∈[-113,+12]mm · 7 of 35 slices shown, 9 images]
[im 5/35  brain]
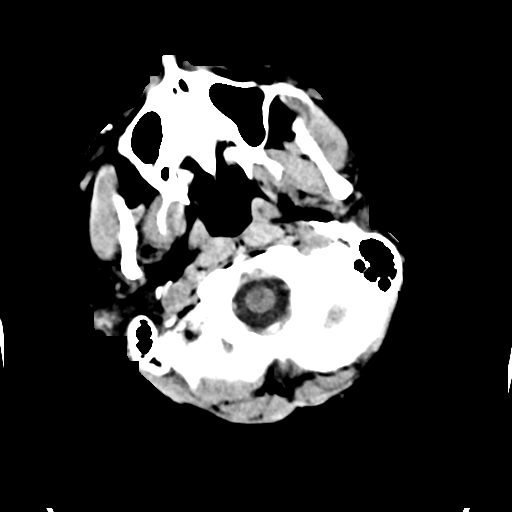
[im 5/35  bone]
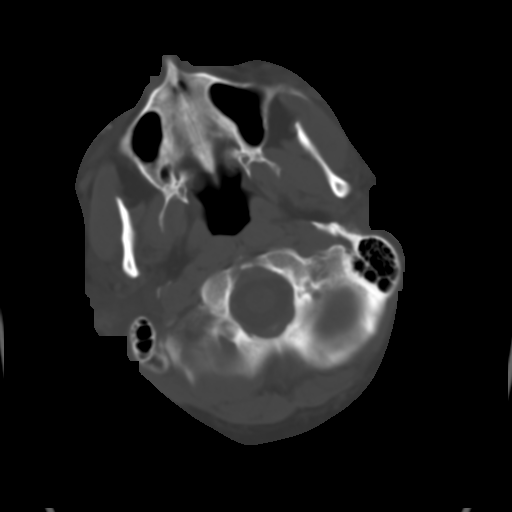
[im 9/35  brain]
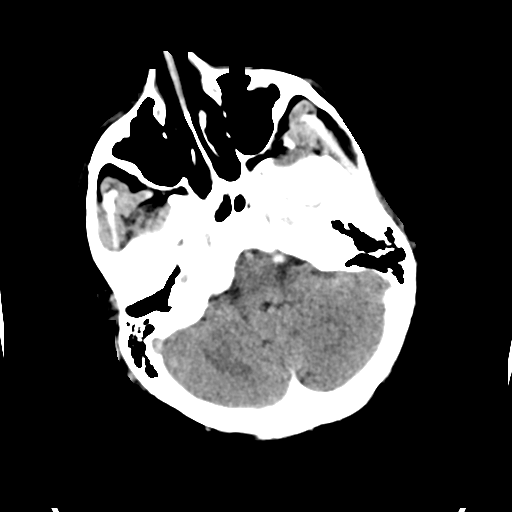
[im 13/35  brain]
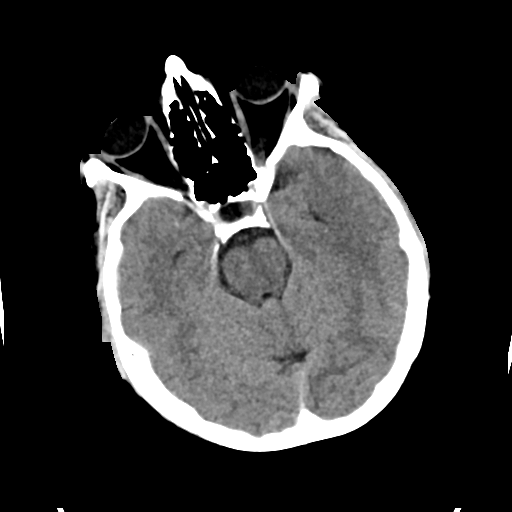
[im 18/35  brain]
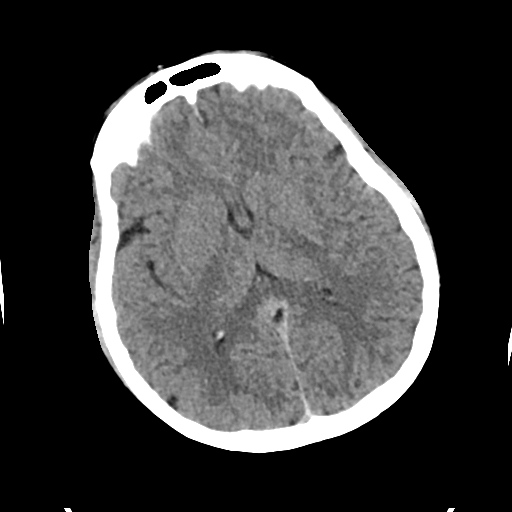
[im 22/35  brain]
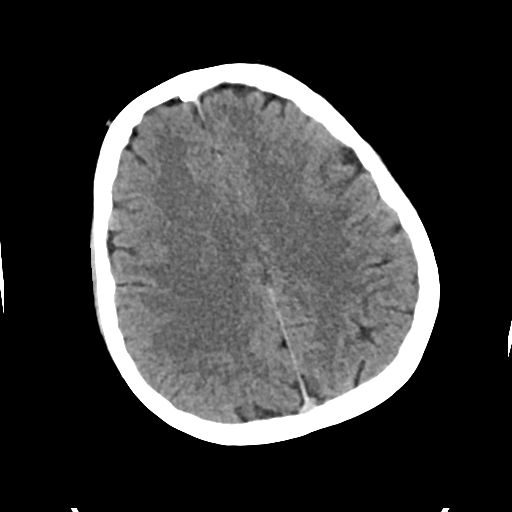
[im 22/35  bone]
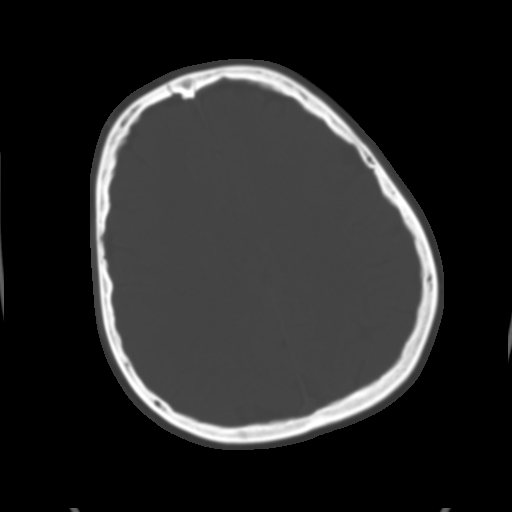
[im 26/35  brain]
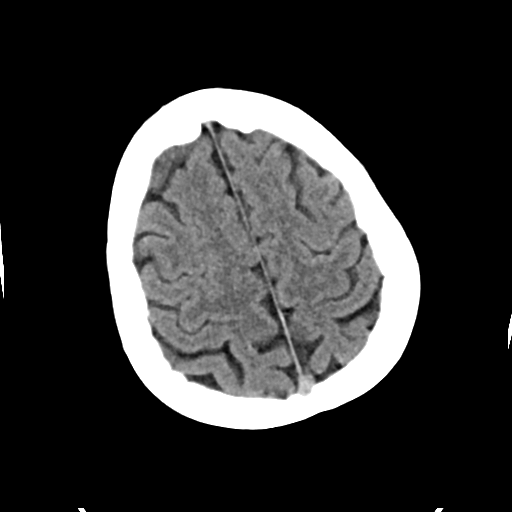
[im 30/35  brain]
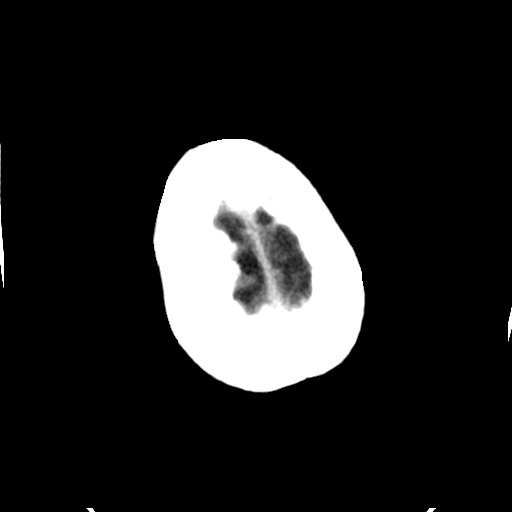

[Series 4: head bone · axial · 0.42mm/px · z∈[-117,-57]mm · 4 of 86 slices shown]
[im 9/86  bone]
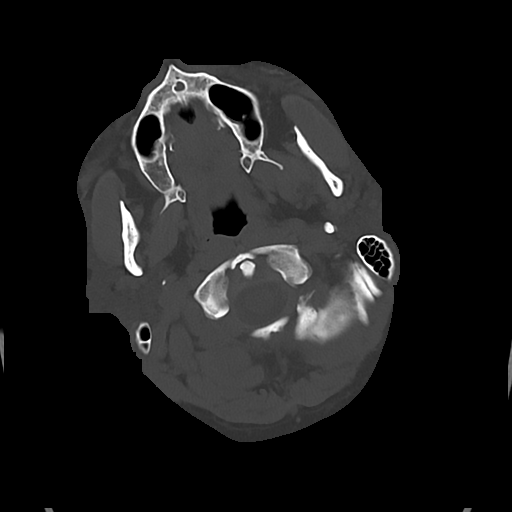
[im 18/86  bone]
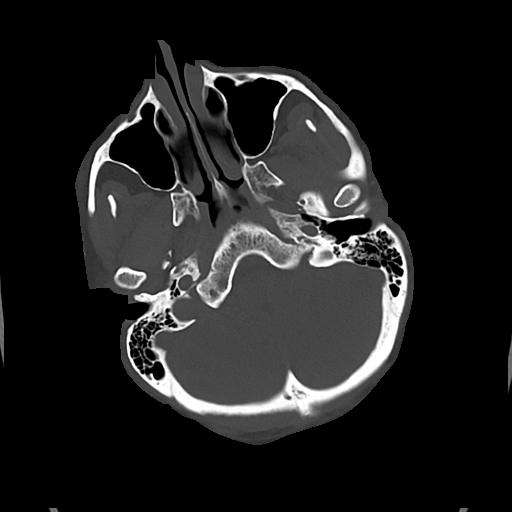
[im 26/86  bone]
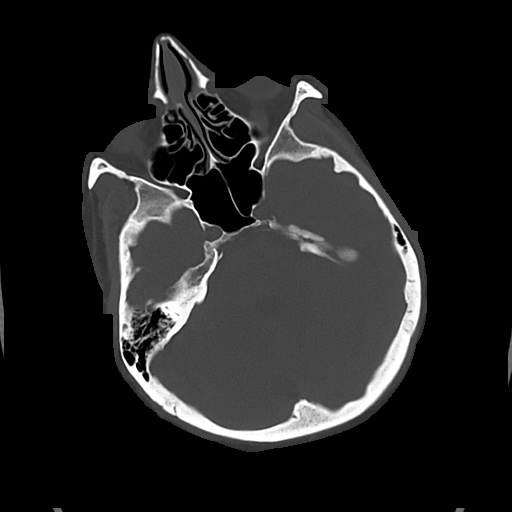
[im 39/86  bone]
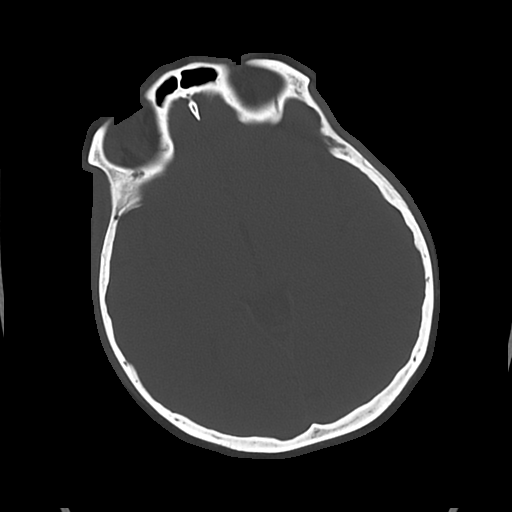

[Series 5: cor soft · coronal · 0.33mm/px · 3 of 66 slices shown]
[im 22/66  brain]
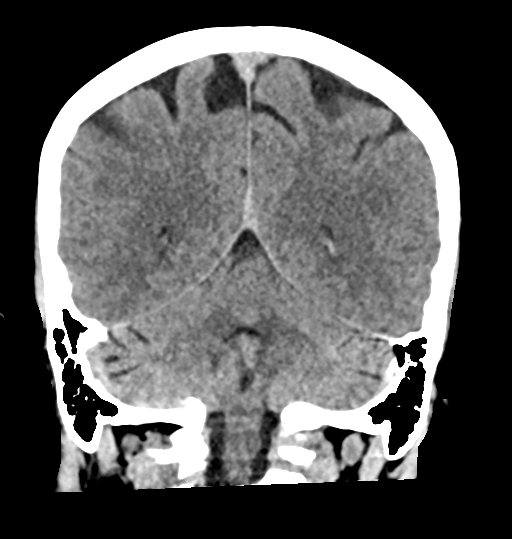
[im 29/66  brain]
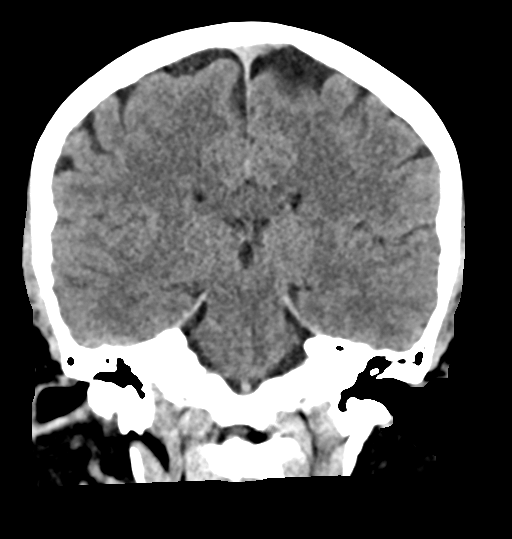
[im 37/66  brain]
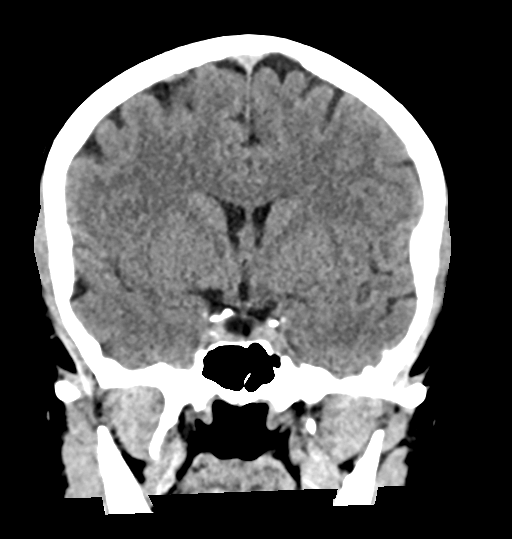

[Series 6: sag soft · sagittal · 0.35mm/px · 3 of 57 slices shown]
[im 19/57  brain]
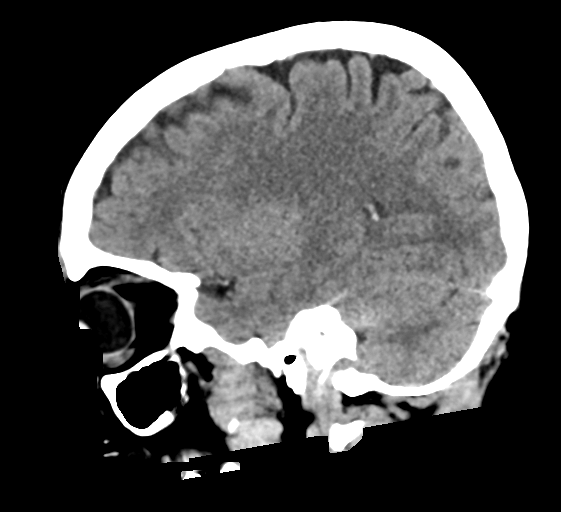
[im 29/57  brain]
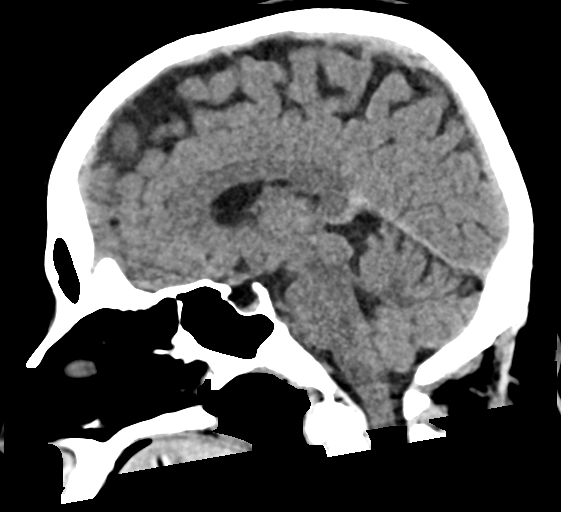
[im 38/57  brain]
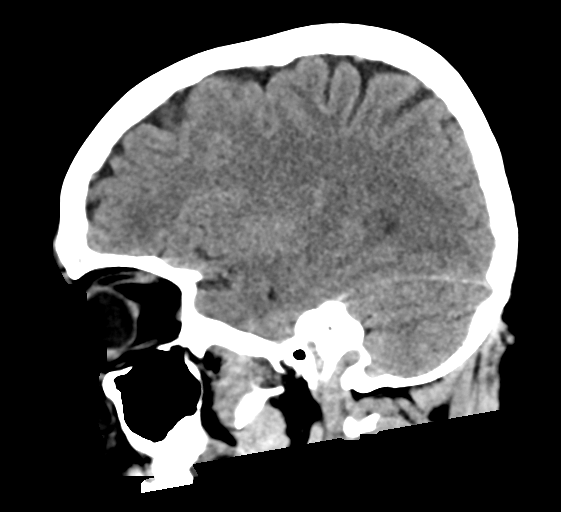

[17 of 47 positions shown; findings below may reference images not displayed]

FINDINGS: Brain: Cerebral volume within normal limits for patient age.

No evidence for acute intracranial hemorrhage. No findings to
suggest acute large vessel territory infarct. No mass lesion,
midline shift, or mass effect. Ventricles are normal in size without
evidence for hydrocephalus. No extra-axial fluid collection
identified.

Vascular: No hyperdense vessel identified.

Skull: Scalp soft tissues demonstrate no acute abnormality.
Calvarium intact.

Sinuses/Orbits: Globes and orbital soft tissues within normal
limits.

Visualized paranasal sinuses are clear. No mastoid effusion.

ASPECTS (Alberta Stroke Program Early CT Score)

- Ganglionic level infarction (caudate, lentiform nuclei, internal
capsule, insula, M1-M3 cortex): 7

- Supraganglionic infarction (M4-M6 cortex): 3

Total score (0-10 with 10 being normal): 10
IMPRESSION: 1. Negative head CT.  No acute intracranial abnormality.
2. ASPECTS is 10.

These results were communicated to Dr. ANILU at [DATE] on
[DATE] by text page via the [employer] messaging system.

## 2020-12-25 IMAGING — MR MR HEAD W/O CM
4 of 6 series · 18 of 48 positions shown · non-contrast
Comparison: CT from earlier the same day.

CLINICAL DATA: Initial evaluation for neuro deficit, stroke
suspected.

EXAM:
MRI HEAD WITHOUT CONTRAST
TECHNIQUE: Multiplanar, multiecho pulse sequences of the brain and surrounding
structures were obtained without intravenous contrast.

[Series 4: DWI · axial · 3.0mm · 0.94mm/px · z∈[-27,+81]mm · 8 of 99 slices shown (1 of 2)]
[im 7/99]
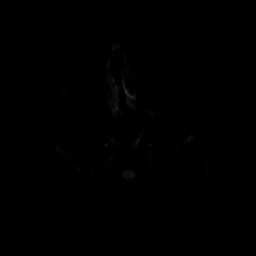
[im 13/99]
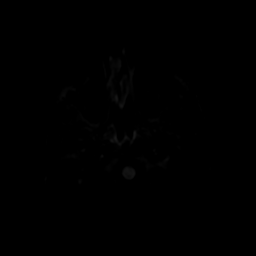
[im 19/99]
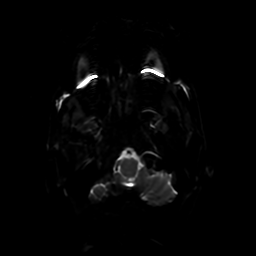
[im 31/99]
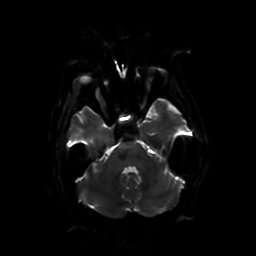
[im 43/99]
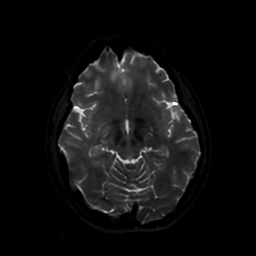
[im 50/99]
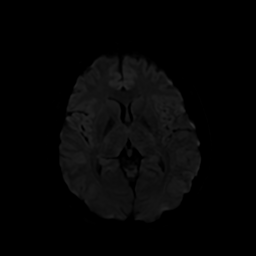
[im 56/99]
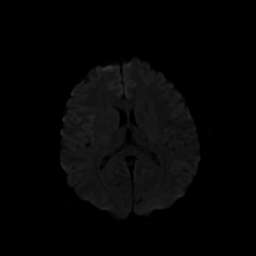
[im 86/99]
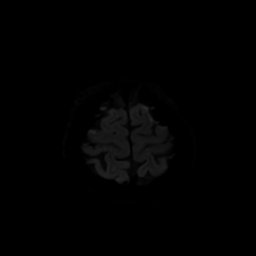

[Series 5: DWI · coronal · 4.0mm · 0.94mm/px · 3 of 65 slices shown (2 of 2)]
[im 8/65]
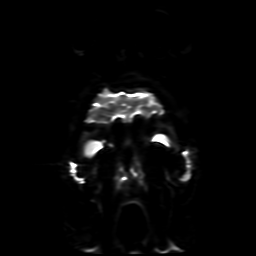
[im 36/65]
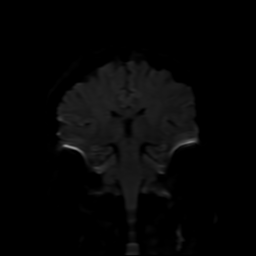
[im 57/65]
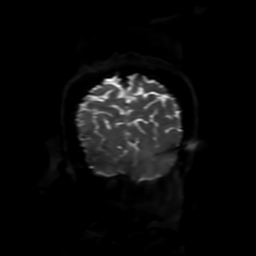

[Series 6: FLAIR · sagittal · 5.0mm · 0.23mm/px · 4 of 25 slices shown]
[im 1/25]
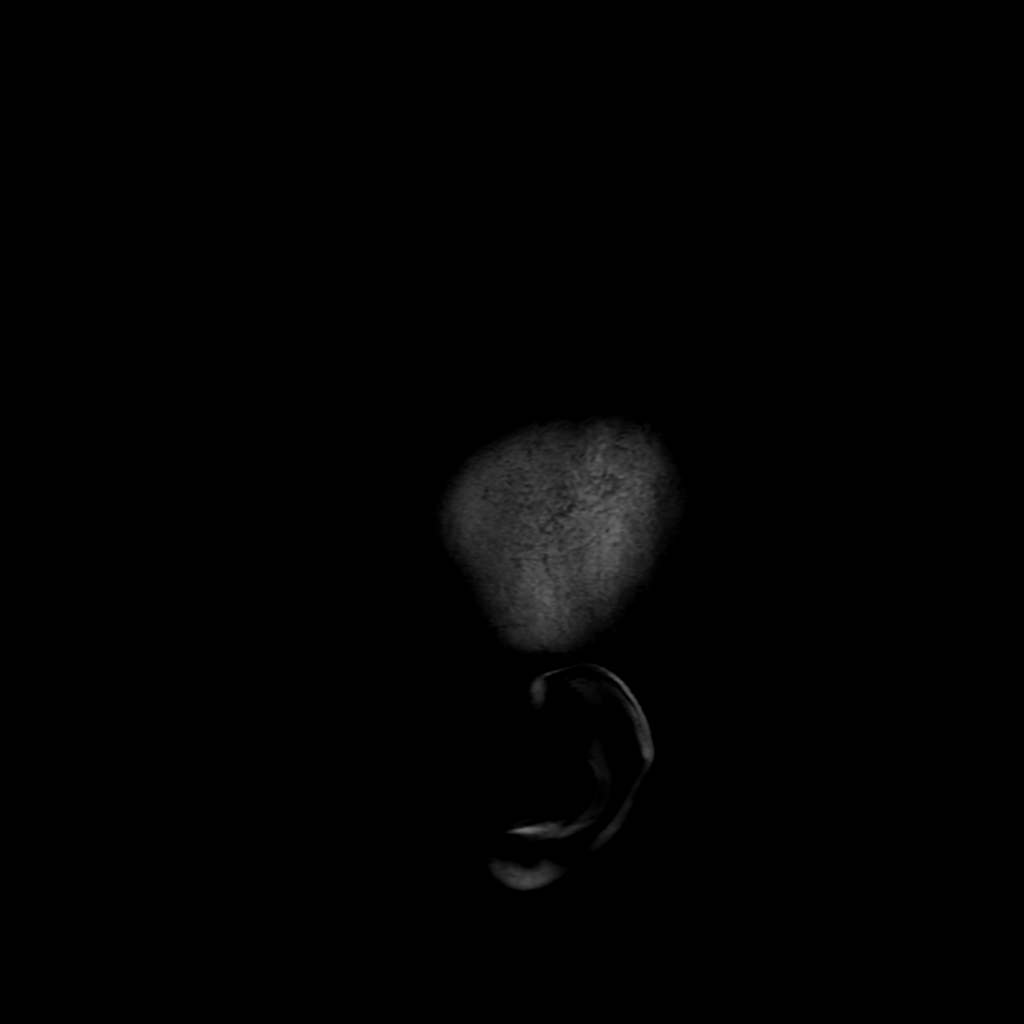
[im 9/25]
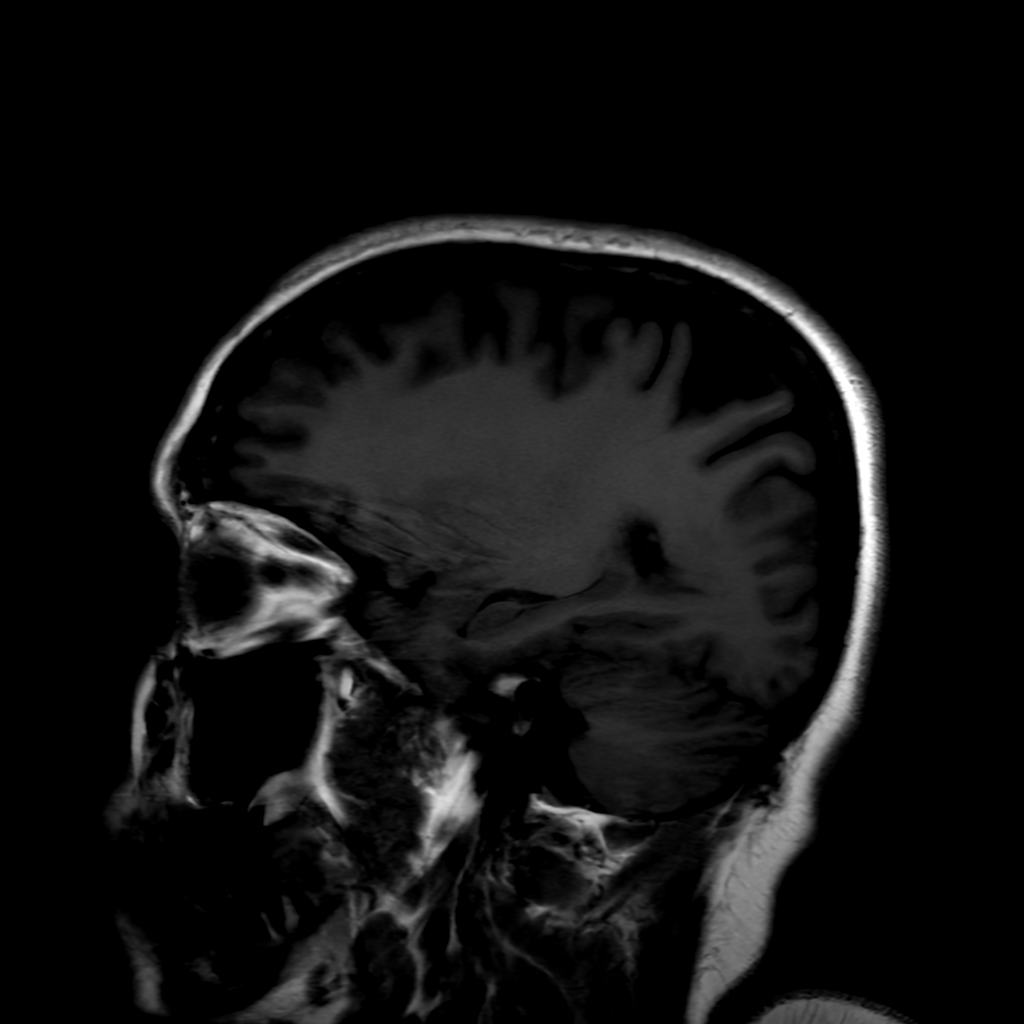
[im 17/25]
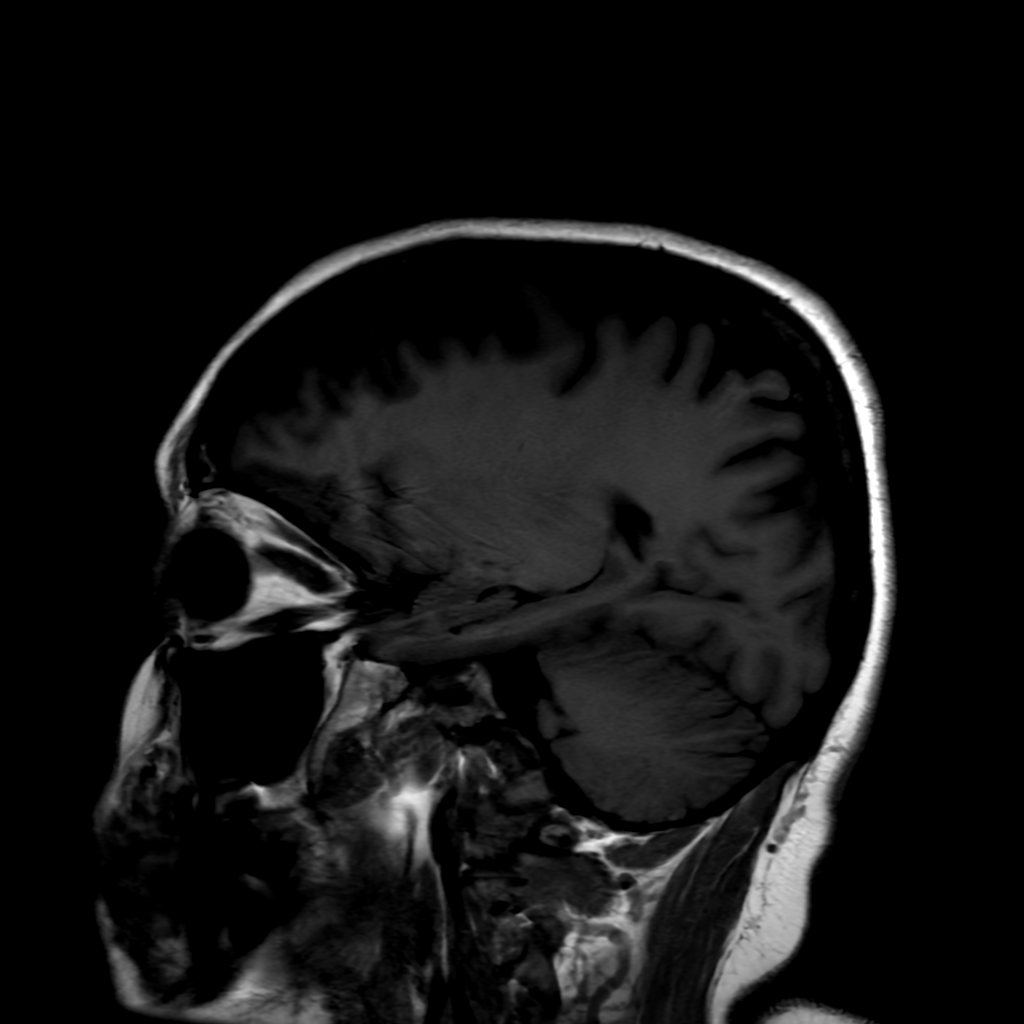
[im 25/25]
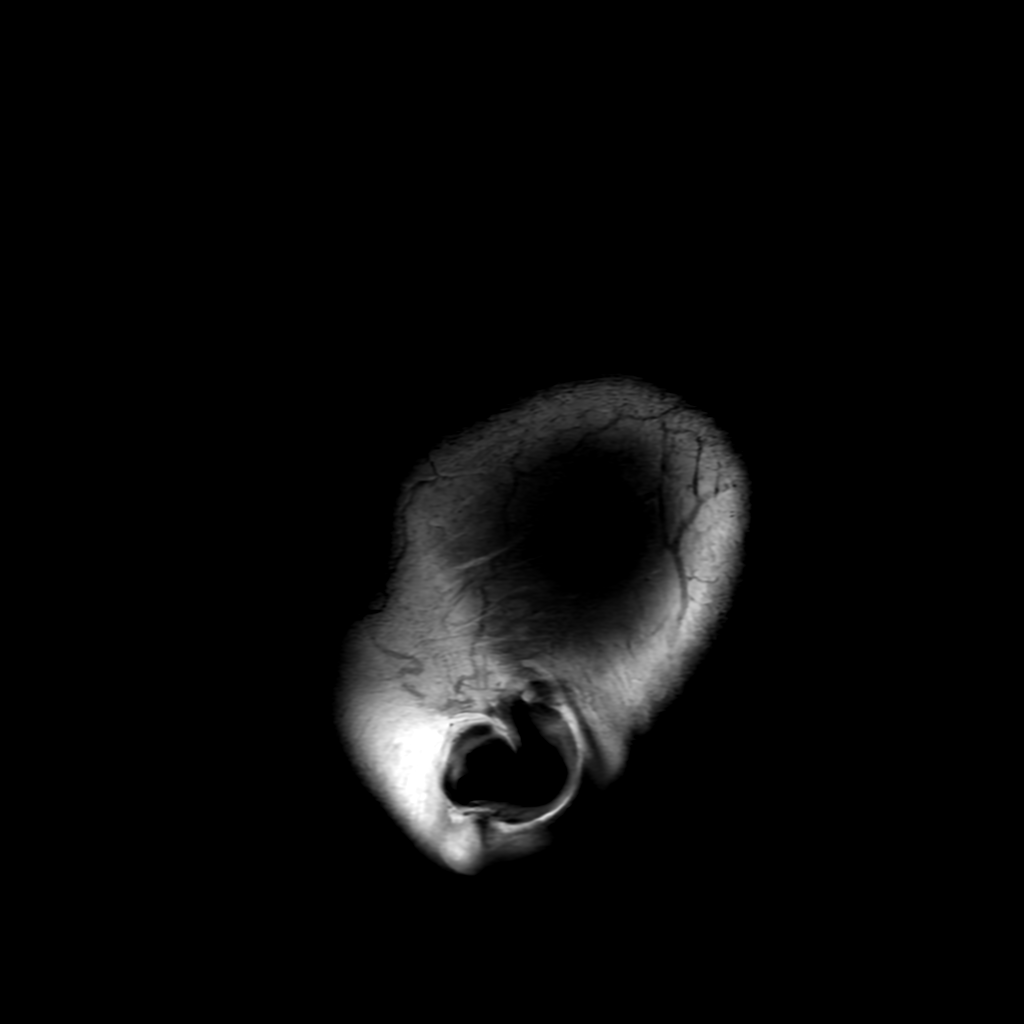

[Series 450: ADC · axial · 3.0mm · 0.94mm/px · z∈[-16,+78]mm · 3 of 50 slices shown]
[im 8/50]
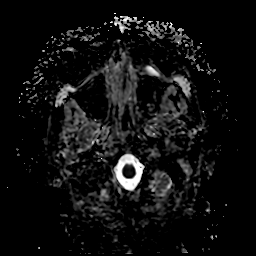
[im 29/50]
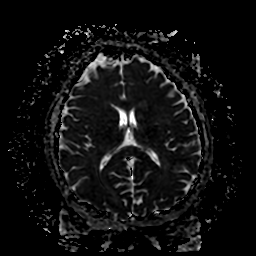
[im 43/50]
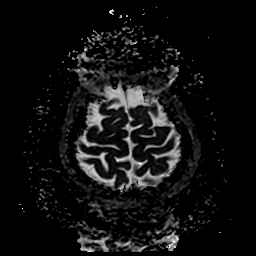

[18 of 48 positions shown; findings below may reference images not displayed]

FINDINGS: Brain: Examination limited with only axial and coronal DWI and axial
T2 weighted sequences performed.

Cerebral volume within normal limits. No definite focal parenchymal
signal abnormality or significant cerebral white matter disease seen
on this limited exam. No abnormal foci of restricted diffusion to
suggest acute or subacute ischemia. Gray-white matter
differentiation maintained. No visible encephalomalacia to suggest
chronic cortical infarction.

No mass lesion, midline shift or mass effect. No hydrocephalus or
extra-axial fluid collection. Suspected at least partially empty
sella noted.

Vascular: Major intracranial vascular flow voids are maintained.

Skull and upper cervical spine: Bone marrow signal intensity grossly
within normal limits. No scalp soft tissue abnormality.

Sinuses/Orbits: Globes and orbital soft tissues demonstrate no acute
finding. Mild scattered mucosal thickening noted within the
ethmoidal air cells. Paranasal sinuses are otherwise clear. No
mastoid effusion. Inner ear structures grossly normal.

Other: None.
IMPRESSION: 1. Limited study with only diffusion weighted sequences and axial T2
weighted sequences performed.
2. No acute intracranial infarct. No other definite acute
intracranial abnormality.

## 2020-12-25 MED ORDER — KETOROLAC TROMETHAMINE 15 MG/ML IJ SOLN
15.0000 mg | Freq: Once | INTRAMUSCULAR | Status: AC
  Administered 2020-12-25: 15 mg via INTRAVENOUS
  Filled 2020-12-25: qty 1

## 2020-12-25 MED ORDER — ONDANSETRON HCL 4 MG/2ML IJ SOLN
4.0000 mg | Freq: Once | INTRAMUSCULAR | Status: AC
  Administered 2020-12-25: 4 mg via INTRAVENOUS
  Filled 2020-12-25: qty 2

## 2020-12-25 MED ORDER — SODIUM CHLORIDE 0.9 % IV BOLUS
500.0000 mL | Freq: Once | INTRAVENOUS | Status: AC
  Administered 2020-12-25: 500 mL via INTRAVENOUS

## 2020-12-25 MED ORDER — DROPERIDOL 2.5 MG/ML IJ SOLN
1.2500 mg | Freq: Once | INTRAMUSCULAR | Status: AC
  Administered 2020-12-25: 1.25 mg via INTRAVENOUS
  Filled 2020-12-25: qty 2

## 2020-12-25 MED ORDER — MAGNESIUM SULFATE 2 GM/50ML IV SOLN
2.0000 g | Freq: Once | INTRAVENOUS | Status: AC
  Administered 2020-12-25: 2 g via INTRAVENOUS
  Filled 2020-12-25: qty 50

## 2020-12-25 MED ORDER — SODIUM CHLORIDE 0.9% FLUSH
3.0000 mL | Freq: Once | INTRAVENOUS | Status: AC
  Administered 2020-12-25: 3 mL via INTRAVENOUS

## 2020-12-25 NOTE — Consult Note (Addendum)
NEUROLOGY CONSULTATION NOTE   Date of service: December 25, 2020 Patient Name: Nichole Green MRN:  [id number] DOB:  1981-03-25 Reason for consult: "?R facial droop and hyperventilation" Requesting Provider: Quintella Reichert, MD _ _ _   _ __   _ __ _ _  __ __   _ __   __ _  History of Present Illness  Nichole Green is a 40 y.o. female with PMH significant for HTN, headaches, who presents with acute nausea, headache. Patient was brought in by Ems. Enroute they were concerned about a potential R facial droop and so a sroke code was activated.  Per EMS, initially called out for loss of consciousness. They found the patient hyperventilating to 20-30s a minute. Patient was talking to EMS. They then noted a R facial droop.  Here patient would not answer my questions or follow any commands. STAT CTH and MRI brain without contrast were negative for large territory stroke or any demyelinating disorder.  On repeat evaluation a couple hours patient is not able to talk or provide history.  She reports that she has had bifrontal headache for the last 2 days which is throbbing in nature with some pressure with associated photophobia and phonophobia and nausea and vomiting.  She reports she gets a headache like this anywhere from 3 times a week to once a month.  She does not see a neurologist for this.  She takes over-the-counter Tylenol which feel sometimes helps.  The headache is not worse with coughing when lying flat.  She was given headache cocktail in the ED with a headache down to 6 out of 10 from a 10 out of 10.  mRS: 0 tNK/thrombectomy: not offered 2/2 no stroke on MRI. LKW: unknown. Maybe 0200 when she was noted to first have R facial droop by EMS. NIHSS components Score: Comment  1a Level of Conscious 0[x]  1[]  2[]  3[]      1b LOC Questions 0[]  1[]  2[x]       1c LOC Commands 0[]  1[]  2[x]       2 Best Gaze 0[x]  1[]  2[]       3 Visual 0[x]  1[]  2[]  3[]    Blinks to threat BL  4 Facial Palsy 0[x]  1[]  2[]  3[]       5a Motor Arm - left 0[]  1[]  2[]  3[x]  4[]  UN[]    5b Motor Arm - Right 0[]  1[]  2[]  3[x]  4[]  UN[]    6a Motor Leg - Left 0[]  1[]  2[]  3[x]  4[]  UN[]    6b Motor Leg - Right 0[]  1[]  2[]  3[x]  4[]  UN[]    7 Limb Ataxia 0[x]  1[]  2[]  3[]  UN[]     8 Sensory 0[x]  1[]  2[]  UN[]      9 Best Language 0[]  1[]  2[]  3[x]      10 Dysarthria 0[]  1[]  2[x]  UN[]      11 Extinct. and Inattention 0[x]  1[]  2[]       TOTAL: 21     ROS   Unable to obtain ROS 2/2 aphasia.  Past History   Past Medical History:  Diagnosis Date   Headache    only when B/P elevated    Hypertension    Infection    UTI   Pregnancy induced hypertension    Past Surgical History:  Procedure Laterality Date   BREAST REDUCTION SURGERY     REDUCTION MAMMAPLASTY Bilateral 2010   ROBOTIC ASSISTED LAPAROSCOPIC OVARIAN CYSTECTOMY N/A 01/17/2016   Procedure: XI ROBOTIC ASSISTED LAPAROSCOPIC OVARIAN CYSTECTOMY;  Surgeon: Everitt Amber, MD;  Location: [facility];  Service: Gynecology;  Laterality: N/A;   Family History  Problem Relation Age of Onset   Hypertension Mother    Diabetes Mother    Social History   Socioeconomic History   Marital status: Married    Spouse name: Not on file   Number of children: Not on file   Years of education: Not on file   Highest education level: Not on file  Occupational History   Not on file  Tobacco Use   Smoking status: Former    Years: 2.00    Types: Cigarettes    Quit date: 10/25/2015    Years since quitting: 5.1   Smokeless tobacco: Never   Tobacco comments:    hookah  Vaping Use   Vaping Use: Never used  Substance and Sexual Activity   Alcohol use: No   Drug use: No   Sexual activity: Yes    Partners: Male    Birth control/protection: Implant  Other Topics Concern   Not on file  Social History Narrative   Not on file   Social Determinants of Health   Financial Resource Strain: Not on file  Food Insecurity: Not on file  Transportation Needs: Not on file  Physical Activity: Not on  file  Stress: Not on file  Social Connections: Not on file   No Known Allergies  Medications  (Not in a hospital admission)    Vitals   There were no vitals filed for this visit.   There is no height or weight on file to calculate BMI.  Physical Exam   General: Laying comfortably in bed; in no acute distress.  HENT: Normal oropharynx and mucosa. Normal external appearance of ears and nose.  Neck: Supple, no pain or tenderness  CV: No JVD. No peripheral edema.  Pulmonary: Symmetric Chest rise. Normal respiratory effort.  Abdomen: Soft to touch, non-tender.  Ext: No cyanosis, edema, or deformity  Skin: No rash. Normal palpation of skin.   Musculoskeletal: Normal digits and nails by inspection. No clubbing.   Neurologic Examination  Mental status/Cognition: Alert, oriented to self, place, month and year, good attention.  Speech/language: Fluent, comprehension intact, object naming intact, repetition intact.  Cranial nerves:   CN II Pupils equal and reactive to light, no VF deficits    CN III,IV,VI EOM intact, no gaze preference or deviation, no nystagmus    CN V normal sensation in V1, V2, and V3 segments bilaterally    CN VII no asymmetry, no nasolabial fold flattening   CN VIII normal hearing to speech    CN IX & X normal palatal elevation, no uvular deviation    CN XI 5/5 head turn and 5/5 shoulder shrug bilaterally    CN XII midline tongue protrusion    Motor:  Muscle bulk: poor, tone normal, tremor none Mvmt Root Nerve  Muscle Right Left Comments  SA C5/6 Ax Deltoid     EF C5/6 Mc Biceps 4 4   EE C6/7/8 Rad Triceps 4 4   WF C6/7 Med FCR     WE C7/8 PIN ECU     F Ab C8/T1 U ADM/FDI 4+ 4+   HF L1/2/3 Fem Illopsoas 3 3   KE L2/3/4 Fem Quad     DF L4/5 D Peron Tib Ant 4 4   PF S1/2 Tibial Grc/Sol 4 4    Reflexes:  Right Left Comments  Pectoralis      Biceps (C5/6) 1 1   Brachioradialis (C5/6) 1 1    Triceps (C6/7) 1 1  Patellar (L3/4) 1  Recnet surgery on  L kness so declined reflex testing.   Achilles (S1) 1 1    Hoffman      Plantar down down   Jaw jerk    Sensation:  Light touch intact   Pin prick    Temperature    Vibration   Proprioception    Coordination/Complex Motor:  - Finger to Nose intact BL - Heel to shin unable to get her to do due to poor participation. - Rapid alternating movement are slowed - Gait: unsafe to assess given BL lower ext weakness.   Labs   CBC: No results for input(s): WBC, NEUTROABS, HGB, HCT, MCV, PLT in the last 168 hours.  Basic Metabolic Panel:  Lab Results  Component Value Date   NA 137 09/26/2020   K 3.3 (L) 09/26/2020   CO2 20 (L) 09/26/2020   GLUCOSE 107 (H) 09/26/2020   BUN 13 09/26/2020   CREATININE 0.61 09/26/2020   CALCIUM 8.4 (L) 09/26/2020   GFRNONAA >60 09/26/2020   GFRAA >60 04/12/2018   Lipid Panel: No results found for: LDLCALC HgbA1c: No results found for: HGBA1C Urine Drug Screen: No results found for: LABOPIA, COCAINSCRNUR, LABBENZ, AMPHETMU, THCU, LABBARB  Alcohol Level No results found for: Hilda  CT Head without contrast(personally reviewed): Personally reviewed and CTH was negative for a large hypodensity concerning for a large territory infarct or hyperdensity concerning for an ICH  MRI Brain(personally reviewed): Only limited MRI Brain was obtained with DWI and T2/FLAIR with no acute stroke, no demyelinating lesions. Impression   Nichole Green is a 39 y.o. female with PMH significant for HTN, headaches, who presents with acute nausea, headache.  She was initially not following commands or moving her arms and legs.  Stat CT head and MRI brain was negative for a stroke.  Subsequently improved reports a headache with migrainous features.  Headache is improved after cocktail to 6/10.  She now has no focal deficit.  Feels it is difficult to lift both of her arms or legs but thinks that is due to her being exhausted and tired.  She gets anywhere from 3 headaches a week to  about 1/month and I think she would benefit from an outpatient evaluation in headache clinic.  Primary Diagnosis:  Migraine with aura and status migrainosus not intractable.  Recommendations  - headache cocktail - Follow up with Dr. Genia Harold with [facility]. - No further inpatient neurological workup at this time. We will signoff. Please feel free to contact us with any questions or concerns. _____________________________________________________________  Plan discussed with Dr. Ralene Bathe and with patient at the bedside   Thank you for the opportunity to take part in the care of this patient. If you have any further questions, please contact the neurology consultation attending.  Signed,  [place] Pager Number [phone] _ _ _   _ __   _ __ _ _  __ __   _ __   __ _   .[email or web address]

## 2020-12-25 NOTE — ED Provider Notes (Signed)
[facility] EMERGENCY DEPARTMENT Provider Note   CSN: [id number] Arrival date & time: 12/25/20  0230     History Chief Complaint  Patient presents with   Code Stroke    Nichole Green is a 40 y.o. female.  The history is provided by the patient.  Nichole Green is a 40 y.o. female who presents to the Emergency Department complaining of code stroke. She presents the emergency department as a code stroke activated by EMS. She reports two days of headaches, located in the frontal region. These are similar to her prior migraine headaches. Headaches significantly worsened this evening and EMS was called. EMS reports left-sided facial droop on their assessment and activated a code stroke. Patient reports mild associated blurred vision with nausea and vomiting. She has no additional medical problems. Does not take oral contraceptives.    Past Medical History:  Diagnosis Date   Headache    only when B/P elevated    Hypertension    Infection    UTI   Pregnancy induced hypertension     Patient Active Problem List   Diagnosis Date Noted   Menorrhagia with regular cycle 06/15/2020   Acute pain of left lower extremity 01/17/2020   Chronic hypertension 06/20/2016   Postpartum care following vaginal delivery 06/20/2016   GDM (gestational diabetes mellitus) 04/09/2016   Uterine fibroid 02/09/2016   Chronic hypertension during pregnancy, antepartum 10/31/2015    Past Surgical History:  Procedure Laterality Date   BREAST REDUCTION SURGERY     REDUCTION MAMMAPLASTY Bilateral 2010   ROBOTIC ASSISTED LAPAROSCOPIC OVARIAN CYSTECTOMY N/A 01/17/2016   Procedure: XI ROBOTIC ASSISTED LAPAROSCOPIC OVARIAN CYSTECTOMY;  Surgeon: Everitt Amber, MD;  Location: [facility];  Service: Gynecology;  Laterality: N/A;     OB History     Gravida  3   Para  3   Term  2   Preterm  1   AB  0   Living  3      SAB  0   IAB  0   Ectopic  0   Multiple  0   Live Births  3         Obstetric Comments  Elevated BP with preg         Family History  Problem Relation Age of Onset   Hypertension Mother    Diabetes Mother     Social History   Tobacco Use   Smoking status: Former    Years: 2.00    Types: Cigarettes    Quit date: 10/25/2015    Years since quitting: 5.1   Smokeless tobacco: Never   Tobacco comments:    hookah  Vaping Use   Vaping Use: Never used  Substance Use Topics   Alcohol use: No   Drug use: No    Home Medications Prior to Admission medications   Medication Sig Start Date End Date Taking? Authorizing Provider  acetaminophen (TYLENOL) 500 MG tablet Take 500 mg by mouth every 6 (six) hours as needed for moderate pain or headache.   Yes [provider]  cyclobenzaprine (FLEXERIL) 10 MG tablet Take 1 tablet (10 mg total) by mouth 2 (two) times daily as needed for muscle spasms. 01/17/20  Yes Upstill, Nehemiah Settle, PA-C  hyoscyamine (LEVSIN SL) 0.125 MG SL tablet Place 1 tablet (0.125 mg total) under the tongue every 4 (four) hours as needed for cramping (abdominal pain). 09/26/20  Yes Pollina, Gwenyth Allegra, MD  metoprolol succinate (TOPROL-XL) 25 MG 24 hr tablet Take  25 mg by mouth daily. 07/31/20  Yes [provider]  ondansetron (ZOFRAN) 4 MG tablet Take 1 tablet (4 mg total) by mouth every 6 (six) hours as needed for nausea or vomiting. 09/26/20  Yes Pollina, Gwenyth Allegra, MD  phentermine (ADIPEX-P) 37.5 MG tablet Take 18.75 mg by mouth 2 (two) times daily. 12/01/20  Yes [provider]  polyethylene glycol (MIRALAX / GLYCOLAX) 17 g packet Take 17 g by mouth daily. 12/01/20  Yes [provider]  SUMAtriptan (IMITREX) 100 MG tablet Take 100 mg by mouth every 12 (twelve) hours as needed for migraine. 12/28/19  Yes [provider]  Vitamin D, Ergocalciferol, (DRISDOL) 1.25 MG (50000 UNIT) CAPS capsule Take 50,000 Units by mouth every Monday. 07/31/20  Yes [provider]  amoxicillin-clavulanate (AUGMENTIN)  875-125 MG tablet Take 1 tablet by mouth 2 (two) times daily. Patient not taking: No sig reported 01/12/20   [provider]  fluconazole (DIFLUCAN) 150 MG tablet TAKE ONE TABLET BY MOUTH AS A ONE-TIME DOSE Patient not taking: No sig reported 03/23/20   Griffin Basil, MD  HYDROcodone-acetaminophen (NORCO/VICODIN) 5-325 MG tablet Take 1 tablet by mouth every 4 (four) hours as needed for severe pain. Patient not taking: No sig reported 01/17/20   Charlann Lange, PA-C  ibuprofen (ADVIL) 600 MG tablet Take 1 tablet (600 mg total) by mouth every 6 (six) hours as needed. Patient not taking: No sig reported 01/17/20   Charlann Lange, PA-C  megestrol (MEGACE) 40 MG tablet Take 1 tablet (40 mg total) by mouth 2 (two) times daily. Can increase to two tablets twice a day in the event of heavy bleeding Patient not taking: Reported on 12/25/2020 08/03/20   Constant, Peggy, MD    Allergies    Patient has no known allergies.  Review of Systems   Review of Systems  All other systems reviewed and are negative.  Physical Exam Updated Vital Signs BP 133/69   Pulse 68   Resp 11   Ht 5\' 3"  (1.6 m)   Wt 80 kg   SpO2 97%   BMI 31.24 kg/m   Physical Exam Vitals and nursing note reviewed.  Constitutional:      General: She is in acute distress.     Appearance: She is well-developed.  HENT:     Head: Normocephalic and atraumatic.  Cardiovascular:     Rate and Rhythm: Normal rate and regular rhythm.     Heart sounds: No murmur heard. Pulmonary:     Effort: Pulmonary effort is normal. No respiratory distress.     Breath sounds: Normal breath sounds.  Abdominal:     Palpations: Abdomen is soft.     Tenderness: There is no abdominal tenderness. There is no guarding or rebound.  Musculoskeletal:        General: No tenderness.  Skin:    General: Skin is warm and dry.  Neurological:     Mental Status: She is alert and oriented to person, place, and time.     Comments: Profound global  weakness involving the face, bilateral upper extremities and bilateral lower extremities  Psychiatric:        Behavior: Behavior normal.    ED Results / Procedures / Treatments   Labs (all labs ordered are listed, but only abnormal results are displayed) Labs Reviewed  CBC - Abnormal; Notable for the following components:      Result Value   RBC 5.50 (*)    MCV 79.8 (*)  MCH 25.3 (*)    All other components within normal limits  COMPREHENSIVE METABOLIC PANEL - Abnormal; Notable for the following components:   CO2 21 (*)    Glucose, Bld 110 (*)    All other components within normal limits  DIFFERENTIAL  CBG MONITORING, ED  I-STAT BETA HCG BLOOD, ED ([facility], [facility], [facility] ONLY)    EKG EKG Interpretation  Date/Time:  Sunday December 25 2020 03:23:21 EDT Ventricular Rate:  75 PR Interval:  198 QRS Duration: 73 QT Interval:  405 QTC Calculation: 453 R Axis:   51 Text Interpretation: Sinus rhythm Borderline T abnormalities, anterior leads Confirmed by Quintella Reichert [phone]) on 12/25/2020 3:46:48 AM  Radiology MR BRAIN WO CONTRAST  Result Date: 12/25/2020 CLINICAL DATA:  Initial evaluation for neuro deficit, stroke suspected. EXAM: MRI HEAD WITHOUT CONTRAST TECHNIQUE: Multiplanar, multiecho pulse sequences of the brain and surrounding structures were obtained without intravenous contrast. COMPARISON:  CT from earlier the same day. FINDINGS: Brain: Examination limited with only axial and coronal DWI and axial T2 weighted sequences performed. Cerebral volume within normal limits. No definite focal parenchymal signal abnormality or significant cerebral white matter disease seen on this limited exam. No abnormal foci of restricted diffusion to suggest acute or subacute ischemia. Gray-white matter differentiation maintained. No visible encephalomalacia to suggest chronic cortical infarction. No mass lesion, midline shift or mass effect. No hydrocephalus or extra-axial fluid collection. Suspected at  least partially empty sella noted. Vascular: Major intracranial vascular flow voids are maintained. Skull and upper cervical spine: Bone marrow signal intensity grossly within normal limits. No scalp soft tissue abnormality. Sinuses/Orbits: Globes and orbital soft tissues demonstrate no acute finding. Mild scattered mucosal thickening noted within the ethmoidal air cells. Paranasal sinuses are otherwise clear. No mastoid effusion. Inner ear structures grossly normal. Other: None. IMPRESSION: 1. Limited study with only diffusion weighted sequences and axial T2 weighted sequences performed. 2. No acute intracranial infarct. No other definite acute intracranial abnormality. Electronically Signed   By: Jeannine Boga M.D.   On: 12/25/2020 03:53   CT HEAD CODE STROKE WO CONTRAST  Result Date: 12/25/2020 CLINICAL DATA:  Code stroke.  Initial evaluation for acute stroke. EXAM: CT HEAD WITHOUT CONTRAST TECHNIQUE: Contiguous axial images were obtained from the base of the skull through the vertex without intravenous contrast. COMPARISON:  CT from 01/16/2020 FINDINGS: Brain: Cerebral volume within normal limits for patient age. No evidence for acute intracranial hemorrhage. No findings to suggest acute large vessel territory infarct. No mass lesion, midline shift, or mass effect. Ventricles are normal in size without evidence for hydrocephalus. No extra-axial fluid collection identified. Vascular: No hyperdense vessel identified. Skull: Scalp soft tissues demonstrate no acute abnormality. Calvarium intact. Sinuses/Orbits: Globes and orbital soft tissues within normal limits. Visualized paranasal sinuses are clear. No mastoid effusion. ASPECTS [facility] Stroke Program Early CT Score) - Ganglionic level infarction (caudate, lentiform nuclei, internal capsule, insula, M1-M3 cortex): 7 - Supraganglionic infarction (M4-M6 cortex): 3 Total score (0-10 with 10 being normal): 10 IMPRESSION: 1. Negative head CT.  No acute  intracranial abnormality. 2. ASPECTS is 10. These results were communicated to Dr. Lorrin Goodell at 2:54 am on 12/25/2020 by text page via the [facility] messaging system. Electronically Signed   By: Jeannine Boga M.D.   On: 12/25/2020 02:55    Procedures Procedures   Medications Ordered in ED Medications  magnesium sulfate IVPB 2 g 50 mL (2 g Intravenous New Bag/Given 12/25/20 0532)  sodium chloride flush (NS) 0.9 % injection 3 mL (  3 mLs Intravenous Given 12/25/20 0438)  ondansetron (ZOFRAN) injection 4 mg (4 mg Intravenous Given 12/25/20 0300)  droperidol (INAPSINE) 2.5 MG/ML injection 1.25 mg (1.25 mg Intravenous Given 12/25/20 0435)  sodium chloride 0.9 % bolus 500 mL (0 mLs Intravenous Stopped 12/25/20 0532)  ketorolac (TORADOL) 15 MG/ML injection 15 mg (15 mg Intravenous Given 12/25/20 0528)    ED Course  I have reviewed the triage vital signs and the nursing notes.  Pertinent labs & imaging results that were available during my care of the patient were reviewed by me and considered in my medical decision making (see chart for details).    MDM Rules/Calculators/A&P                          patient here as a code stroke for facial droop, headache. She was evaluated by neurology on patients ED presentation. On initial assessment patient with global weakness and difficulty following commands. Imaging is negative for acute CVA. She was treated with medications for migraine headache with significant improvement in her symptoms. On reassessment she is able to move all extremities symmetrically. Plan to discharge home with home care for headache with outpatient neurology follow-up and return precautions.  Final Clinical Impression(s) / ED Diagnoses Final diagnoses:  Complicated migraine    Rx / DC Orders ED Discharge Orders          Ordered    Ambulatory referral to Neurology       Comments: An appointment is requested in approximately: 2 weeks Re complicated migraine   44/[id number]/75 [id number]              Quintella Reichert, MD 12/25/20 [phone]

## 2020-12-25 NOTE — ED Triage Notes (Signed)
Pt comes via [facility] from home, LSN was 2am, called out for LOC, pt was hyperventilating, c/o of headache and hypertensive, pt then began to have R sided facial droop.

## 2021-01-05 ENCOUNTER — Ambulatory Visit: Payer: Medicaid Other | Admitting: Neurology

## 2021-01-05 ENCOUNTER — Encounter: Payer: Self-pay | Admitting: Neurology

## 2021-01-05 VITALS — BP 113/73 | HR 84 | Ht 64.0 in | Wt 174.0 lb

## 2021-01-05 DIAGNOSIS — G43109 Migraine with aura, not intractable, without status migrainosus: Secondary | ICD-10-CM | POA: Diagnosis not present

## 2021-01-05 MED ORDER — RIZATRIPTAN BENZOATE 10 MG PO TBDP: 10.0000 mg | ORAL_TABLET | ORAL | 11 refills | Status: DC | PRN

## 2021-01-05 MED ORDER — ONDANSETRON 8 MG PO TBDP: 4.0000 mg | ORAL_TABLET | Freq: Three times a day (TID) | ORAL | 3 refills | Status: DC | PRN

## 2021-01-05 NOTE — Patient Instructions (Addendum)
Rizatriptan: Please take one tablet at the onset of your headache. If it does not improve the symptoms please take one additional tablet. Do not take more then 2 tablets in 24hrs. Do not take use more then 2 to 3 times in a week. Take with Ondansetron.   I would recommend trying Ajovy or Emgality for prevention in the future if needed.  Rizatriptan Disintegrating Tablets What is this medication? RIZATRIPTAN (rye za TRIP tan) treats migraines. It works by blocking pain signals and narrowing blood vessels in the brain. It belongs to a group of medications called triptans. It is not used to prevent migraines. This medicine may be used for other purposes; ask your health care provider or pharmacist if you have questions. COMMON BRAND NAME(S): Maxalt-MLT What should I tell my care team before I take this medication? They need to know if you have any of these conditions: Cigarette smoker Circulation problems in fingers and toes Diabetes Heart disease High blood pressure High cholesterol History of irregular heartbeat History of stroke Kidney disease Liver disease Stomach or intestine problems An unusual or allergic reaction to rizatriptan, other medications, foods, dyes, or preservatives Pregnant or trying to get pregnant Breast-feeding How should I use this medication? Take this medication by mouth. Follow the directions on the prescription label. Leave the tablet in the sealed blister pack until you are ready to take it. With dry hands, open the blister and gently remove the tablet. If the tablet breaks or crumbles, throw it away and take a new tablet out of the blister pack. Place the tablet in the mouth and allow it to dissolve, and then swallow. Do not cut, crush, or chew this medication. You do not need water to take this medication. Do not take it more often than directed. Talk to your care team regarding the use of this medication in children. While this medication may be prescribed for  children as young as 6 years for selected conditions, precautions do apply. Overdosage: If you think you have taken too much of this medicine contact a poison control center or emergency room at once. NOTE: This medicine is only for you. Do not share this medicine with others. What if I miss a dose? This does not apply. This medication is not for regular use. What may interact with this medication? Do not take this medication with any of the following medications: Certain medications for migraine headache like almotriptan, eletriptan, frovatriptan, naratriptan, rizatriptan, sumatriptan, zolmitriptan Ergot alkaloids like dihydroergotamine, ergonovine, ergotamine, methylergonovine MAOIs like Carbex, Eldepryl, Marplan, Nardil, and Parnate This medication may also interact with the following medications: Certain medications for depression, anxiety, or psychotic disorders Propranolol This list may not describe all possible interactions. Give your health care provider a list of all the medicines, herbs, non-prescription drugs, or dietary supplements you use. Also tell them if you smoke, drink alcohol, or use illegal drugs. Some items may interact with your medicine. What should I watch for while using this medication? Visit your care team for regular checks on your progress. Tell your care team if your symptoms do not start to get better or if they get worse. You may get drowsy or dizzy. Do not drive, use machinery, or do anything that needs mental alertness until you know how this medication affects you. Do not stand up or sit up quickly, especially if you are an older patient. This reduces the risk of dizzy or fainting spells. Alcohol may interfere with the effect of this medication. Your mouth  may get dry. Chewing sugarless gum or sucking hard candy and drinking plenty of water may help. Contact your care team if the problem does not go away or is severe. If you take migraine medications for 10 or  more days a month, your migraines may get worse. Keep a diary of headache days and medication use. Contact your care team if your migraine attacks occur more frequently. What side effects may I notice from receiving this medication? Side effects that you should report to your care team as soon as possible: Allergic reactions-skin rash, itching, hives, swelling of the face, lips, tongue, or throat Burning, pain, tingling, or color changes in the legs or feet Heart attack-pain or tightness in the chest, shoulders, arms, or jaw, nausea, shortness of breath, cold or clammy skin, feeling faint or lightheaded Heart rhythm changes-fast or irregular heartbeat, dizziness, feeling faint or lightheaded, chest pain, trouble breathing Increase in blood pressure Irritability, confusion, fast or irregular heartbeat, muscle stiffness, twitching muscles, sweating, high fever, seizure, chills, vomiting, diarrhea, which may be signs of serotonin syndrome Raynaud's-cool, numb, or painful fingers or toes that may change color from pale, to blue, to red Seizures Stroke-sudden numbness or weakness of the face, arm, or leg, trouble speaking, confusion, trouble walking, loss of balance or coordination, dizziness, severe headache, change in vision Sudden or severe stomach pain, nausea, vomiting, fever, or bloody diarrhea Vision loss Side effects that usually do not require medical attention (report to your care team if they continue or are bothersome): Dizziness General discomfort or fatigue This list may not describe all possible side effects. Call your doctor for medical advice about side effects. You may report side effects to FDA at 1-800-FDA-1088. Where should I keep my medication? Keep out of the reach of children and pets. Store at room temperature between 15 and 30 degrees C (59 and 86 degrees F). Protect from light and moisture. Throw away any unused medication after the expiration date. NOTE: This sheet is a  summary. It may not cover all possible information. If you have questions about this medicine, talk to your doctor, pharmacist, or health care provider.  2022 Elsevier/Gold Standard (2020-04-06 16:19:19) Ondansetron Dissolving Tablets What is this medication? ONDANSETRON (on DAN se tron) prevents nausea and vomiting from chemotherapy, radiation, or surgery. It works by blocking substances in the body that may cause nausea or vomiting. It belongs to a group of medications called antiemetics. This medicine may be used for other purposes; ask your health care provider or pharmacist if you have questions. COMMON BRAND NAME(S): Zofran ODT What should I tell my care team before I take this medication? They need to know if you have any of these conditions: Heart disease History of irregular heartbeat Liver disease Low levels of magnesium or potassium in the blood An unusual or allergic reaction to ondansetron, granisetron, other medications, foods, dyes, or preservatives Pregnant or trying to get pregnant Breast-feeding How should I use this medication? These tablets are made to dissolve in the mouth. Do not try to push the tablet through the foil backing. With dry hands, peel away the foil backing and gently remove the tablet. Place the tablet in the mouth and allow it to dissolve, then swallow. While you may take these tablets with water, it is not necessary to do so. Talk to your care team regarding the use of this medication in children. Special care may be needed. Overdosage: If you think you have taken too much of this medicine contact a  poison control center or emergency room at once. NOTE: This medicine is only for you. Do not share this medicine with others. What if I miss a dose? If you miss a dose, take it as soon as you can. If it is almost time for your next dose, take only that dose. Do not take double or extra doses. What may interact with this medication? Do not take this medication  with any of the following: Apomorphine Certain medications for fungal infections like fluconazole, itraconazole, ketoconazole, posaconazole, voriconazole Cisapride Dronedarone Pimozide Thioridazine This medication may also interact with the following: Carbamazepine Certain medications for depression, anxiety, or psychotic disturbances Fentanyl Linezolid MAOIs like Carbex, Eldepryl, Marplan, Nardil, and Parnate Methylene blue (injected into a vein) Other medications that prolong the QT interval (cause an abnormal heart rhythm) like dofetilide, ziprasidone Phenytoin Rifampicin Tramadol This list may not describe all possible interactions. Give your health care provider a list of all the medicines, herbs, non-prescription drugs, or dietary supplements you use. Also tell them if you smoke, drink alcohol, or use illegal drugs. Some items may interact with your medicine. What should I watch for while using this medication? Check with your care team as soon as you can if you have any sign of an allergic reaction. What side effects may I notice from receiving this medication? Side effects that you should report to your care team as soon as possible: Allergic reactions-skin rash, itching, hives, swelling of the face, lips, tongue, or throat Bowel blockage-stomach cramping, unable to have a bowel movement or pass gas, loss of appetite, vomiting Chest pain (angina)-pain, pressure, or tightness in the chest, neck, back, or arms Heart rhythm changes-fast or irregular heartbeat, dizziness, feeling faint or lightheaded, chest pain, trouble breathing Irritability, confusion, fast or irregular heartbeat, muscle stiffness, twitching muscles, sweating, high fever, seizure, chills, vomiting, diarrhea, which may be signs of serotonin syndrome Side effects that usually do not require medical attention (report to your care team if they continue or are bothersome): Constipation Diarrhea General discomfort and  fatigue Headache This list may not describe all possible side effects. Call your doctor for medical advice about side effects. You may report side effects to FDA at 1-800-FDA-1088. Where should I keep my medication? Keep out of the reach of children and pets. Store between 2 and 30 degrees C (36 and 86 degrees F). Throw away any unused medication after the expiration date. NOTE: This sheet is a summary. It may not cover all possible information. If you have questions about this medicine, talk to your doctor, pharmacist, or health care provider.  2022 Elsevier/Gold Standard (2020-04-01 14:39:19)     Rolanda Lundborg injection What is this medication? FREMANEZUMAB (fre ma NEZ ue mab) is used to prevent migraine headaches. This medicine may be used for other purposes; ask your health care provider or pharmacist if you have questions. COMMON BRAND NAME(S): AJOVY What should I tell my care team before I take this medication? They need to know if you have any of these conditions: an unusual or allergic reaction to fremanezumab, other medicines, foods, dyes, or preservatives pregnant or trying to get pregnant breast-feeding How should I use this medication? This medicine is for injection under the skin. You will be taught how to prepare and give this medicine. Use exactly as directed. Take your medicine at regular intervals. Do not take your medicine more often than directed. It is important that you put your used needles and syringes in a special sharps container. Do not put  them in a trash can. If you do not have a sharps container, call your pharmacist or healthcare provider to get one. Talk to your pediatrician regarding the use of this medicine in children. Special care may be needed. Overdosage: If you think you have taken too much of this medicine contact a poison control center or emergency room at once. NOTE: This medicine is only for you. Do not share this medicine with others. What if I  miss a dose? If you miss a dose, take it as soon as you can. If it is almost time for your next dose, take only that dose. Do not take double or extra doses. What may interact with this medication? Interactions are not expected. This list may not describe all possible interactions. Give your health care provider a list of all the medicines, herbs, non-prescription drugs, or dietary supplements you use. Also tell them if you smoke, drink alcohol, or use illegal drugs. Some items may interact with your medicine. What should I watch for while using this medication? Tell your doctor or healthcare professional if your symptoms do not start to get better or if they get worse. What side effects may I notice from receiving this medication? Side effects that you should report to your doctor or health care professional as soon as possible: allergic reactions like skin rash, itching or hives, swelling of the face, lips, or tongue Side effects that usually do not require medical attention (report these to your doctor or health care professional if they continue or are bothersome): pain, redness, or irritation at site where injected This list may not describe all possible side effects. Call your doctor for medical advice about side effects. You may report side effects to FDA at 1-800-FDA-1088. Where should I keep my medication? Keep out of the reach of children. You will be instructed on how to store this medicine. Throw away any unused medicine after the expiration date on the label. NOTE: This sheet is a summary. It may not cover all possible information. If you have questions about this medicine, talk to your doctor, pharmacist, or health care provider.  2022 Elsevier/Gold Standard (2016-12-10 17:22:56)

## 2021-01-05 NOTE — Progress Notes (Signed)
[facility]    Provider:  Dr Jaynee Eagles Requesting Provider: Quintella Reichert, MD Primary Care Provider:  Audley Hose, MD  CC:  migraines  HPI:  Nichole Green is a 40 y.o. female here as requested by Quintella Reichert, MD for migraines. PMHx  HTN.  I reviewed epic notes, patient was seen in the emergency room October 2 as a code stroke, she was having 2 days of headaches located in the frontal region by report, records stated these are similar to her prior migraine headaches, significantly worsened in the evening and EMS was called, EMS reported left-sided facial droop on their assessment and activated a code stroke, she reported mild associated blurred vision with nausea and vomiting not on oral contraceptives.  Code stroke was called for facial droop and headache, she was evaluated by neurology on presentation, initial assessment she had global weakness and difficulty following commands, CT scan of the head and MRI of the brain limited studies were negative for CVA, she was treated with medications for migraine with significant improvement in her symptoms diagnosed with "complicated migraine" also migraine with aura.  Started 5 years ago when she was overweight, she get 1-2 a week, but can last up to 3 days, she had sleeve and lost 85 pounds, 6 to 8 months ago started getting worse. She went to the ED on 10/2, she had a migraine for 2 days, she gets nausea, vomiting, photo/phonophobia, vomiting helps, pulsating/pounding/throbbing, movement makes it worse. Triggers by stress and she was so stresed she was shaking on the right side. However notes state she was generally week, no focality, no right facial droop, she was generally week byt symmetrically and she states it was because she was in so much pain. The sumatriptan doesn't work. No other focal neurologic deficits, associated symptoms, inciting events or modifiable factors.   Reviewed notes, labs and imaging from outside  physicians, which showed:  I reviewed images of the brain, limited study, nothing acute noted, no strokes seen.12/25/2020. CT same day normal.   Review of Systems: Patient complains of symptoms per HPI as well as the following symptoms headaches. Pertinent negatives and positives per HPI. All others negative.   Social History   Socioeconomic History   Marital status: Married    Spouse name: Not on file   Number of children: 3   Years of education: Not on file   Highest education level: Not on file  Occupational History   Not on file  Tobacco Use   Smoking status: Never   Smokeless tobacco: Never   Tobacco comments:    Hookah sometimes   Vaping Use   Vaping Use: Some days  Substance and Sexual Activity   Alcohol use: No   Drug use: No   Sexual activity: Yes    Partners: Male    Birth control/protection: Implant  Other Topics Concern   Not on file  Social History Narrative   Lives at home with spouse & children   Right handed   Caffeine: every 2-3 days a drink   Social Determinants of Health   Financial Resource Strain: Not on file  Food Insecurity: Not on file  Transportation Needs: Not on file  Physical Activity: Not on file  Stress: Not on file  Social Connections: Not on file  Intimate Partner Violence: Not on file    Family History  Problem Relation Age of Onset   Hypertension Mother    Diabetes Mother    Migraines Mother  Past Medical History:  Diagnosis Date   Headache    only when B/P elevated    Hypertension    Infection    UTI   Pregnancy induced hypertension     Patient Active Problem List   Diagnosis Date Noted   Migraine with aura and without status migrainosus, not intractable 01/08/2021   Menorrhagia with regular cycle 06/15/2020   Acute pain of left lower extremity 01/17/2020   Chronic hypertension 06/20/2016   Postpartum care following vaginal delivery 06/20/2016   GDM (gestational diabetes mellitus) 04/09/2016   Uterine  fibroid 02/09/2016   Chronic hypertension during pregnancy, antepartum 10/31/2015    Past Surgical History:  Procedure Laterality Date   ABDOMINOPLASTY  2020   BREAST REDUCTION SURGERY     GASTRIC SLEEVE  2019   REDUCTION MAMMAPLASTY Bilateral 2010   ROBOTIC ASSISTED LAPAROSCOPIC OVARIAN CYSTECTOMY N/A 01/17/2016   Procedure: XI ROBOTIC ASSISTED LAPAROSCOPIC OVARIAN CYSTECTOMY;  Surgeon: Everitt Amber, MD;  Location: [facility];  Service: Gynecology;  Laterality: N/A;    Current Outpatient Medications  Medication Sig Dispense Refill   acetaminophen (TYLENOL) 500 MG tablet Take 500 mg by mouth every 6 (six) hours as needed for moderate pain or headache.     cyclobenzaprine (FLEXERIL) 10 MG tablet Take 1 tablet (10 mg total) by mouth 2 (two) times daily as needed for muscle spasms. 20 tablet 0   Ferrous Sulfate (IRON PO) Take by mouth.     ibuprofen (ADVIL) 600 MG tablet Take 1 tablet (600 mg total) by mouth every 6 (six) hours as needed. 30 tablet 0   megestrol (MEGACE) 40 MG tablet Take 1 tablet (40 mg total) by mouth 2 (two) times daily. Can increase to two tablets twice a day in the event of heavy bleeding 60 tablet 5   metoprolol succinate (TOPROL-XL) 25 MG 24 hr tablet Take 25 mg by mouth daily.     ondansetron (ZOFRAN) 4 MG tablet Take 1 tablet (4 mg total) by mouth every 6 (six) hours as needed for nausea or vomiting. 20 tablet 0   ondansetron (ZOFRAN-ODT) 8 MG disintegrating tablet Take 0.5-1 tablets (4-8 mg total) by mouth every 8 (eight) hours as needed. Take with Rizatriptan for migraine or alone for nausea/vomiting 30 tablet 3   phentermine (ADIPEX-P) 37.5 MG tablet Take 18.75 mg by mouth 2 (two) times daily.     polyethylene glycol (MIRALAX / GLYCOLAX) 17 g packet Take 17 g by mouth daily.     rizatriptan (MAXALT-MLT) 10 MG disintegrating tablet Take 1 tablet (10 mg total) by mouth as needed for migraine. May repeat in 2 hours if needed 9 tablet 11   Vitamin D, Ergocalciferol,  (DRISDOL) 1.25 MG (50000 UNIT) CAPS capsule Take 50,000 Units by mouth every Monday.     Current Facility-Administered Medications  Medication Dose Route Frequency Provider Last Rate Last Admin   etonogestrel (NEXPLANON) implant 68 mg  68 mg Subdermal Once Constant, Peggy, MD        Allergies as of 01/05/2021   (No Known Allergies)    Vitals: BP 113/73 (BP Location: Right Arm, Patient Position: Sitting)   Pulse 84   Ht 5\' 4"  (1.626 m)   Wt 174 lb (78.9 kg)   BMI 29.87 kg/m  Last Weight:  Wt Readings from Last 1 Encounters:  01/05/21 174 lb (78.9 kg)   Last Height:   Ht Readings from Last 1 Encounters:  01/05/21 5\' 4"  (1.626 m)     Physical exam: Exam:  Gen: NAD, conversant, well nourised, overweight, well groomed                     CV: RRR, no MRG. No Carotid Bruits. No peripheral edema, warm, nontender Eyes: Conjunctivae clear without exudates or hemorrhage  Neuro: Detailed Neurologic Exam  Speech:    Speech is normal; fluent and spontaneous with normal comprehension.  Cognition:    The patient is oriented to person, place, and time;     recent and remote memory intact;     language fluent;     normal attention, concentration,     fund of knowledge Cranial Nerves:    The pupils are equal, round, and reactive to light. The fundi are flat. Visual fields are full to finger confrontation. Extraocular movements are intact. Trigeminal sensation is intact and the muscles of mastication are normal. The face is symmetric. The palate elevates in the midline. Hearing intact. Voice is normal. Shoulder shrug is normal. The tongue has normal motion without fasciculations.   Coordination:    Normal   Gait:    normal.   Motor Observation:    No asymmetry, no atrophy, and no involuntary movements noted. Tone:    Normal muscle tone.    Posture:    Posture is normal. normal erect    Strength:    Strength is V/V in the upper and lower limbs.      Sensation: intact to  LT     Reflex Exam:  DTR's:    Deep tendon reflexes in the upper and lower extremities are symmetrical  bilaterally.   Toes:    The toes are downgoing bilaterally.   Clonus:    Clonus is absent.    Assessment/Plan:  Patient with episodic migraine with aura. Discussed management, increased risk of stroke, she chooses to try acute management at this time only.  Rizatriptan: Please take one tablet at the onset of your headache. If it does not improve the symptoms please take one additional tablet. Do not take more then 2 tablets in 24hrs. Do not take use more then 2 to 3 times in a week. Take with Ondansetron.   I would recommend trying Ajovy or Emgality for prevention in the future if needed.  Meds ordered this encounter  Medications   ondansetron (ZOFRAN-ODT) 8 MG disintegrating tablet    Sig: Take 0.5-1 tablets (4-8 mg total) by mouth every 8 (eight) hours as needed. Take with Rizatriptan for migraine or alone for nausea/vomiting    Dispense:  30 tablet    Refill:  3   rizatriptan (MAXALT-MLT) 10 MG disintegrating tablet    Sig: Take 1 tablet (10 mg total) by mouth as needed for migraine. May repeat in 2 hours if needed    Dispense:  9 tablet    Refill:  11    Cc: Quintella Reichert, MD,  Audley Hose, MD  Sarina Ill, MD  [facility] [address] [place], [place] [address]  Phone [phone] Fax [phone]

## 2021-01-08 DIAGNOSIS — G43109 Migraine with aura, not intractable, without status migrainosus: Secondary | ICD-10-CM | POA: Insufficient documentation

## 2021-03-22 ENCOUNTER — Other Ambulatory Visit: Payer: Self-pay

## 2021-03-22 ENCOUNTER — Ambulatory Visit: Payer: Medicaid Other | Attending: Physician Assistant | Admitting: Physical Therapy

## 2021-03-22 ENCOUNTER — Encounter: Payer: Self-pay | Admitting: Physical Therapy

## 2021-03-22 DIAGNOSIS — G8929 Other chronic pain: Secondary | ICD-10-CM | POA: Diagnosis present

## 2021-03-22 DIAGNOSIS — M6281 Muscle weakness (generalized): Secondary | ICD-10-CM | POA: Diagnosis present

## 2021-03-22 DIAGNOSIS — R262 Difficulty in walking, not elsewhere classified: Secondary | ICD-10-CM | POA: Insufficient documentation

## 2021-03-22 DIAGNOSIS — M25662 Stiffness of left knee, not elsewhere classified: Secondary | ICD-10-CM | POA: Diagnosis present

## 2021-03-22 DIAGNOSIS — M25562 Pain in left knee: Secondary | ICD-10-CM | POA: Insufficient documentation

## 2021-03-22 NOTE — Therapy (Signed)
[place] [place], [place], [address] Phone: [phone]   Fax:  [phone]  Physical Therapy Evaluation  Patient Details  Name: Nichole Green MRN: [id number] Date of Birth: [birth year] Referring Provider (PT): Dr French Ana   Encounter Date: 03/22/2021   PT End of Session - 03/22/21 1828     Visit Number 1    Number of Visits 13    Date for PT Re-Evaluation 05/12/21    Authorization Type [employer], does not cover estim, vaso, ionto    PT Start Time 1800    PT Stop Time 1845    PT Time Calculation (min) 45 min    Activity Tolerance Patient limited by pain    Behavior During Therapy Agitated             Past Medical History:  Diagnosis Date   Headache    only when B/P elevated    Hypertension    Infection    UTI   Pregnancy induced hypertension     Past Surgical History:  Procedure Laterality Date   ABDOMINOPLASTY  2020   BREAST REDUCTION SURGERY     GASTRIC SLEEVE  2019   REDUCTION MAMMAPLASTY Bilateral 2010   ROBOTIC ASSISTED LAPAROSCOPIC OVARIAN CYSTECTOMY N/A 01/17/2016   Procedure: XI ROBOTIC ASSISTED LAPAROSCOPIC OVARIAN CYSTECTOMY;  Surgeon: Everitt Amber, MD;  Location: [facility];  Service: Gynecology;  Laterality: N/A;    There were no vitals filed for this visit.    Subjective Assessment - 03/22/21 1807     Subjective Patient reports a left knee scope in August, she reports that she has had worse pain.  She reports that MD is unsure why the pain, wears a brace on the left knee night and day.reports difficulty sleeping.  She has pain from the left hip and anterior thigh    Limitations Walking;Standing    How long can you stand comfortably? 30 minutes    How long can you walk comfortably? 10 minutes    Patient Stated Goals have less pain, sleep better    Currently in Pain? Yes    Pain Score 7     Pain Location Knee    Pain Orientation Left    Pain Descriptors / Indicators Sore;Sharp     Pain Type Chronic pain    Pain Onset More than a month ago    Pain Frequency Constant    Aggravating Factors  activity and without brace pain can be up to 10/10    Pain Relieving Factors wearing brace, Aleve, tylenol    Effect of Pain on Daily Activities hurts all the time, difficulty sleeping                [facility] PT Assessment - 03/22/21 0001       Assessment   Medical Diagnosis L knee pain    Referring Provider (PT) Dr French Ana    Prior Therapy yes last summer with no help      Precautions   Precautions None    Required Braces or Orthoses Other Brace/Splint    Other Brace/Splint knee brace      Restrictions   Weight Bearing Restrictions No      Balance Screen   Has the patient fallen in the past 6 months No    Has the patient had a decrease in activity level because of a fear of falling?  No      Home Environment   Alternate Level Stairs-Number of Steps  14    Additional Comments does housework      Prior Function   Level of Independence Independent    Leisure works out 5x/week, reports worse past 2 weeks and stopped working out      AROM   Overall AROM Comments hip motions WFL's but with pain, all knee motions increase pain    Left Knee Extension 0    Left Knee Flexion 93      Strength   Overall Strength Comments left hip 4-/5 with pain in the lateral thigh and anterior hip, knee MMT increased pain    Left Knee Flexion 4-/5    Left Knee Extension 4-/5      Palpation   Palpation comment TTP left anterior hip, ITB and buttock and Lumbar area, no crepitus      Ambulation/Gait   Gait Comments no device but with knee brace, on, antalgic on the left                        Objective measurements completed on examination: See above findings.                  PT Short Term Goals - 03/22/21 1835       PT SHORT TERM GOAL #1   Title Pt will be independent with initial HEP.    Time 2    Period Weeks    Status New                PT Long Term Goals - 03/22/21 1835       PT LONG TERM GOAL #1   Title Patient will be independent with advanced HEP.    Time 8    Period Weeks    Status New      PT LONG TERM GOAL #2   Title Patient will increase L knee strength to at least 5/5 to allow her to play with her children.    Time 8    Period Weeks    Status New      PT LONG TERM GOAL #3   Title Patient will be able to perform L SLS for at least 15 seconds to decrease her risk of falling.    Time 8    Period Weeks    Status New      PT LONG TERM GOAL #4   Title Patient will be able to ambulate for greater than 20 minutes with pain no greater than 2/10 to allow her to walk her dog.    Time 8    Period Weeks    Status New      PT LONG TERM GOAL #5   Title increase left knee AROM to WFL's    Time 8    Status New                    Plan - 03/22/21 1832     Clinical Impression Statement Patient appeared to be agitated this evening, reported pain 7/10 with brace but without brace on the left knee rated a 10/10, she reports that she had surgery on the knee in August but did not know what was done, she seemed to describe a scope, we saw her in May without any relief of symtpoms.  She reports that her pain is worse now after surgery than prior.  She reports difficulty sleeping.  She has decresed ROM and strength compared to measurments in May.  She has difficulty  describing pain, has pain in the left hip, the left thigh the lateral thigh and the knee, reports "deep all over" pain.    Stability/Clinical Decision Making Evolving/Moderate complexity    Clinical Decision Making Low    Rehab Potential Fair    PT Frequency 2x / week    PT Duration 6 weeks    PT Treatment/Interventions ADLs/Self Care Home Management;Ultrasound;Gait training;Stair training;Functional mobility training;Therapeutic activities;Therapeutic exercise;Balance training;Patient/family education;Manual techniques;Passive range of  motion;Dry needling    PT Next Visit Plan patient with high rating of pain, will try to help her with pain    Consulted and Agree with Plan of Care Patient             Patient will benefit from skilled therapeutic intervention in order to improve the following deficits and impairments:  Difficulty walking, Decreased range of motion, Decreased activity tolerance, Pain, Decreased balance, Impaired flexibility, Decreased strength, Increased edema  Visit Diagnosis: Chronic pain of left knee - Plan: PT plan of care cert/re-cert  Difficulty in walking, not elsewhere classified - Plan: PT plan of care cert/re-cert  Stiffness of left knee, not elsewhere classified - Plan: PT plan of care cert/re-cert  Muscle weakness (generalized) - Plan: PT plan of care cert/re-cert     Problem List Patient Active Problem List   Diagnosis Date Noted   Migraine with aura and without status migrainosus, not intractable 01/08/2021   Menorrhagia with regular cycle 06/15/2020   Acute pain of left lower extremity 01/17/2020   Chronic hypertension 06/20/2016   Postpartum care following vaginal delivery 06/20/2016   GDM (gestational diabetes mellitus) 04/09/2016   Uterine fibroid 02/09/2016   Chronic hypertension during pregnancy, antepartum 10/31/2015    Sumner Boast, PT 03/22/2021, 6:41 PM  [place] [place], [place], [address] Phone: [phone]   Fax:  [phone]  Name: Merida Alcantar MRN: [id number] Date of Birth: 1980-08-13

## 2021-04-03 ENCOUNTER — Ambulatory Visit: Payer: Medicaid Other | Admitting: Physical Therapy

## 2021-04-10 ENCOUNTER — Ambulatory Visit: Payer: Medicaid Other | Attending: Physician Assistant | Admitting: Physical Therapy

## 2021-04-17 ENCOUNTER — Ambulatory Visit: Payer: Medicaid Other | Admitting: Physical Therapy

## 2021-10-30 ENCOUNTER — Telehealth: Payer: Medicaid Other | Admitting: Physician Assistant

## 2021-10-30 DIAGNOSIS — N898 Other specified noninflammatory disorders of vagina: Secondary | ICD-10-CM

## 2021-10-30 DIAGNOSIS — M545 Low back pain, unspecified: Secondary | ICD-10-CM

## 2021-10-30 DIAGNOSIS — R509 Fever, unspecified: Secondary | ICD-10-CM

## 2021-10-30 NOTE — Progress Notes (Signed)
Because you are having vaginal discharge with back pain and low fever, I feel your condition warrants further evaluation and I recommend that you be seen in a face to face visit.   NOTE: There will be NO CHARGE for this eVisit   If you are having a true medical emergency please call 911.      For an urgent face to face visit, [facility] has seven urgent care centers for your convenience:     [facility] [place] Get Driving Directions [phone] [place], [place] [address]    [facility]) Get Driving Directions [phone] [place], [place] [address]  [facility] ([place]) Get Driving Directions [phone] [address] [place],  [place]  [address]  [facility] [address] [place] Get Driving Directions  [phone] [address] [place],  [place] [address]   [facility] Get Driving Directions [phone] [address] [place], [place], [place] [address]   [facility] Get Driving Directions  [phone] [address] [place], [place] [address]   [facility] [place] Get Driving Directions [phone] [address], [place], [place] [address]  Your MyChart E-visit questionnaire answers were reviewed by a board certified advanced clinical practitioner to complete your personal care plan based on your specific symptoms.  Thank you for using e-Visits.   I provided 5 minutes of non face-to-face time during this encounter for chart review and documentation.

## 2021-11-11 ENCOUNTER — Encounter (INDEPENDENT_AMBULATORY_CARE_PROVIDER_SITE_OTHER): Payer: Self-pay

## 2021-11-16 ENCOUNTER — Other Ambulatory Visit: Payer: Self-pay | Admitting: Family Medicine

## 2021-11-16 DIAGNOSIS — R1030 Lower abdominal pain, unspecified: Secondary | ICD-10-CM

## 2021-11-21 ENCOUNTER — Ambulatory Visit
Admission: RE | Admit: 2021-11-21 | Discharge: 2021-11-21 | Disposition: A | Discharge status: Home / Self Care | Payer: Medicaid Other | Source: Ambulatory Visit | Attending: Family Medicine | Admitting: Family Medicine

## 2021-11-21 DIAGNOSIS — R1030 Lower abdominal pain, unspecified: Secondary | ICD-10-CM

## 2021-12-28 ENCOUNTER — Ambulatory Visit: Payer: Medicaid Other | Admitting: Obstetrics and Gynecology

## 2022-01-02 ENCOUNTER — Other Ambulatory Visit: Payer: Self-pay | Admitting: Family Medicine

## 2022-01-02 DIAGNOSIS — Z1231 Encounter for screening mammogram for malignant neoplasm of breast: Secondary | ICD-10-CM

## 2022-01-11 ENCOUNTER — Other Ambulatory Visit (HOSPITAL_COMMUNITY)
Admission: RE | Admit: 2022-01-11 | Discharge: 2022-01-11 | Disposition: A | Discharge status: Home / Self Care | Payer: Medicaid Other | Source: Ambulatory Visit | Attending: Obstetrics and Gynecology | Admitting: Obstetrics and Gynecology

## 2022-01-11 ENCOUNTER — Ambulatory Visit (INDEPENDENT_AMBULATORY_CARE_PROVIDER_SITE_OTHER): Payer: Medicaid Other | Admitting: Obstetrics

## 2022-01-11 ENCOUNTER — Encounter: Payer: Self-pay | Admitting: Obstetrics

## 2022-01-11 VITALS — BP 115/74 | HR 79 | Ht 63.0 in | Wt 191.0 lb

## 2022-01-11 DIAGNOSIS — N83201 Unspecified ovarian cyst, right side: Secondary | ICD-10-CM

## 2022-01-11 DIAGNOSIS — N898 Other specified noninflammatory disorders of vagina: Secondary | ICD-10-CM

## 2022-01-11 DIAGNOSIS — Z01419 Encounter for gynecological examination (general) (routine) without abnormal findings: Secondary | ICD-10-CM | POA: Insufficient documentation

## 2022-01-11 DIAGNOSIS — Z1239 Encounter for other screening for malignant neoplasm of breast: Secondary | ICD-10-CM

## 2022-01-11 DIAGNOSIS — Z113 Encounter for screening for infections with a predominantly sexual mode of transmission: Secondary | ICD-10-CM | POA: Diagnosis not present

## 2022-01-11 NOTE — Progress Notes (Signed)
41 y.o. GYN presents for AEX/PAP/STD screening.

## 2022-01-11 NOTE — Progress Notes (Signed)
Subjective:        Nichole Green is a 41 y.o. female here for a routine exam.  Current complaints: Pelvic pain, RLQ.Marland Kitchen  Recent ultrasound showed a 4 cm simple cyst of right ovary.  She is taking Tylenol; with good relief.  She had an ovarian cyst during a previous pregnancy that was surgically removed.  Personal health questionnaire:  Is patient Ashkenazi Jewish, have a family history of breast and/or ovarian cancer: no Is there a family history of uterine cancer diagnosed at age < 26, gastrointestinal cancer, urinary tract cancer, family member who is a Field seismologist syndrome-associated carrier: no Is the patient overweight and hypertensive, family history of diabetes, personal history of gestational diabetes, preeclampsia or PCOS: no Is patient over 18, have PCOS,  family history of premature CHD under age 70, diabetes, smoke, have hypertension or peripheral artery disease:  no At any time, has a partner hit, kicked or otherwise hurt or frightened you?: no Over the past 2 weeks, have you felt down, depressed or hopeless?: no Over the past 2 weeks, have you felt little interest or pleasure in doing things?:no   Gynecologic History Patient's last menstrual period was 12/28/2021 (exact date). Contraception: none Last Pap: 12-27-2021. Results were: normal Last mammogram: 2021. Results were: normal  Obstetric History OB History  Gravida Para Term Preterm AB Living  '3 3 2 1 '$ 0 3  SAB IAB Ectopic Multiple Live Births  0 0 0 0 3    # Outcome Date GA Lbr Len/2nd Weight Sex Delivery Anes PTL Lv  3 Preterm 05/22/16 [id number] 7 lb 2 oz (3.232 kg) M Vag-Spont EPI  LIV  2 Term      Vag-Spont     1 Term      Vag-Spont       Obstetric Comments  Elevated BP with preg    Past Medical History:  Diagnosis Date   Headache    only when B/P elevated    Hypertension    Infection    UTI   Pregnancy induced hypertension     Past Surgical History:  Procedure Laterality Date   ABDOMINOPLASTY  2020    BREAST REDUCTION SURGERY     GASTRIC SLEEVE  2019   REDUCTION MAMMAPLASTY Bilateral 2010   ROBOTIC ASSISTED LAPAROSCOPIC OVARIAN CYSTECTOMY N/A 01/17/2016   Procedure: XI ROBOTIC ASSISTED LAPAROSCOPIC OVARIAN CYSTECTOMY;  Surgeon: EEveritt Amber MD;  Location: [facility];  Service: Gynecology;  Laterality: N/A;     Current Outpatient Medications:    acetaminophen (TYLENOL) 500 MG tablet, Take 500 mg by mouth every 6 (six) hours as needed for moderate pain or headache., Disp: , Rfl:    cyclobenzaprine (FLEXERIL) 10 MG tablet, Take 1 tablet (10 mg total) by mouth 2 (two) times daily as needed for muscle spasms., Disp: 20 tablet, Rfl: 0   Ferrous Sulfate (IRON PO), Take by mouth., Disp: , Rfl:    ibuprofen (ADVIL) 600 MG tablet, Take 1 tablet (600 mg total) by mouth every 6 (six) hours as needed., Disp: 30 tablet, Rfl: 0   megestrol (MEGACE) 40 MG tablet, Take 1 tablet (40 mg total) by mouth 2 (two) times daily. Can increase to two tablets twice a day in the event of heavy bleeding, Disp: 60 tablet, Rfl: 5   metoprolol succinate (TOPROL-XL) 25 MG 24 hr tablet, Take 25 mg by mouth daily., Disp: , Rfl:    ondansetron (ZOFRAN) 4 MG tablet, Take 1 tablet (4 mg total) by mouth every  6 (six) hours as needed for nausea or vomiting., Disp: 20 tablet, Rfl: 0   ondansetron (ZOFRAN-ODT) 8 MG disintegrating tablet, Take 0.5-1 tablets (4-8 mg total) by mouth every 8 (eight) hours as needed. Take with Rizatriptan for migraine or alone for nausea/vomiting, Disp: 30 tablet, Rfl: 3   phentermine (ADIPEX-P) 37.5 MG tablet, Take 18.75 mg by mouth 2 (two) times daily., Disp: , Rfl:    polyethylene glycol (MIRALAX / GLYCOLAX) 17 g packet, Take 17 g by mouth daily., Disp: , Rfl:    rizatriptan (MAXALT-MLT) 10 MG disintegrating tablet, Take 1 tablet (10 mg total) by mouth as needed for migraine. May repeat in 2 hours if needed, Disp: 9 tablet, Rfl: 11   Vitamin D, Ergocalciferol, (DRISDOL) 1.25 MG (50000 UNIT) CAPS capsule,  Take 50,000 Units by mouth every Monday., Disp: , Rfl:   Current Facility-Administered Medications:    etonogestrel (NEXPLANON) implant 68 mg, 68 mg, Subdermal, Once, Constant, Peggy, MD No Known Allergies  Social History   Tobacco Use   Smoking status: Every Day    Types: Cigarettes   Smokeless tobacco: Current   Tobacco comments:    Hookah sometimes   Substance Use Topics   Alcohol use: No    Family History  Problem Relation Age of Onset   Hypertension Mother    Diabetes Mother    Migraines Mother       Review of Systems  Constitutional: negative for fatigue and weight loss Respiratory: negative for cough and wheezing Cardiovascular: negative for chest pain, fatigue and palpitations Gastrointestinal: negative for abdominal pain and change in bowel habits Musculoskeletal:negative for myalgias Neurological: negative for gait problems and tremors Behavioral/Psych: negative for abusive relationship, depression Endocrine: negative for temperature intolerance    Genitourinary:positive for pelvic pain.  negative for abnormal menstrual periods, genital lesions, hot flashes, sexual problems and vaginal discharge Integument/breast: negative for breast lump, breast tenderness, nipple discharge and skin lesion(s)    Objective:       BP 115/74   Pulse 79   Ht '5\' 3"'$  (1.6 m)   Wt 191 lb (86.6 kg)   LMP 12/28/2021 (Exact Date)   BMI 33.83 kg/m  General:   Alert and no distress  Skin:   no rash or abnormalities  Lungs:   clear to auscultation bilaterally  Heart:   regular rate and rhythm, S1, S2 normal, no murmur, click, rub or gallop  Breasts:   normal without suspicious masses, skin or nipple changes or axillary nodes  Abdomen:  normal findings: no organomegaly, soft, non-tender and no hernia  Pelvis:  External genitalia: normal general appearance Urinary system: urethral meatus normal and bladder without fullness, nontender Vaginal: normal without tenderness, induration or  masses Cervix: normal appearance Adnexa: normal bimanual exam Uterus: anteverted and non-tender, normal size   Lab Review Urine pregnancy test Labs reviewed yes Radiologic studies reviewed yes  I have spent a total of 20 minutes of face-to-face and non-face-to-face time, excluding clinical staff time, reviewing notes and preparing to see patient, ordering tests and/or medications, and counseling the patient.   Assessment:    1. Encounter for gynecological examination with Papanicolaou smear of cervix Rx: - Cytology - PAP( [facility])  2. Vaginal discharge Rx: - Cervicovaginal ancillary only( [facility])  3. Screen for STD (sexually transmitted disease) Rx: - Hepatitis C Antibody - HIV antibody (with reflex) - RPR - Hepatitis B Surface AntiGEN  4. Screening breast examination Rx: - MM 3D SCREEN BREAST BILATERAL; Future  5.  Cyst of right ovary - simple cyst.  Repeat ultrasound in 2 months - follow up in office in 2 months - instructed to return earlier if pain worsens     Plan:    Education reviewed: calcium supplements, depression evaluation, low fat, low cholesterol diet, safe sex/STD prevention, self breast exams, and weight bearing exercise. Follow up in: 2 months.   No orders of the defined types were placed in this encounter.  Orders Placed This Encounter  Procedures   MM 3D SCREEN BREAST BILATERAL    Standing Status:   Future    Standing Expiration Date:   01/12/2023    Order Specific Question:   Reason for Exam (SYMPTOM  OR DIAGNOSIS REQUIRED)    Answer:   Screening    Order Specific Question:   Is the patient pregnant?    Answer:   No    Order Specific Question:   Preferred imaging location?    Answer:   [facility]   Hepatitis C Antibody   HIV antibody (with reflex)   RPR   Hepatitis B Surface AntiGEN     Shelly Bombard, MD 01/11/2022 2:55 PM

## 2022-01-12 LAB — HEPATITIS B SURFACE ANTIGEN: Hepatitis B Surface Ag: NEGATIVE

## 2022-01-12 LAB — HEPATITIS C ANTIBODY: Hep C Virus Ab: NONREACTIVE

## 2022-01-12 LAB — CERVICOVAGINAL ANCILLARY ONLY
Bacterial Vaginitis (gardnerella): NEGATIVE
Candida Glabrata: NEGATIVE
Candida Vaginitis: NEGATIVE
Chlamydia: NEGATIVE
Comment: NEGATIVE
Comment: NEGATIVE
Comment: NEGATIVE
Comment: NEGATIVE
Comment: NEGATIVE
Comment: NORMAL
Neisseria Gonorrhea: NEGATIVE
Trichomonas: NEGATIVE

## 2022-01-12 LAB — RPR: RPR Ser Ql: NONREACTIVE

## 2022-01-12 LAB — HIV ANTIBODY (ROUTINE TESTING W REFLEX): HIV Screen 4th Generation wRfx: NONREACTIVE

## 2022-01-15 LAB — CYTOLOGY - PAP
Comment: NEGATIVE
Diagnosis: NEGATIVE
High risk HPV: NEGATIVE

## 2022-01-28 ENCOUNTER — Other Ambulatory Visit: Payer: Self-pay | Admitting: Neurology

## 2022-03-08 ENCOUNTER — Ambulatory Visit: Payer: Medicaid Other | Admitting: Obstetrics and Gynecology

## 2022-03-08 NOTE — Progress Notes (Signed)
Pt is in office for f/u ovarian cyst.   Pt denies any pain/problems today.

## 2022-03-10 NOTE — Progress Notes (Signed)
Patient was not seen by a provider

## 2022-03-30 ENCOUNTER — Ambulatory Visit
Admission: RE | Admit: 2022-03-30 | Discharge: 2022-03-30 | Disposition: A | Discharge status: Home / Self Care | Payer: Medicaid Other | Source: Ambulatory Visit | Attending: Family Medicine | Admitting: Family Medicine

## 2022-03-30 ENCOUNTER — Other Ambulatory Visit: Payer: Self-pay | Admitting: Family Medicine

## 2022-03-30 DIAGNOSIS — M549 Dorsalgia, unspecified: Secondary | ICD-10-CM

## 2022-03-30 DIAGNOSIS — M79602 Pain in left arm: Secondary | ICD-10-CM

## 2022-08-16 ENCOUNTER — Ambulatory Visit: Payer: Medicaid Other | Admitting: Obstetrics and Gynecology

## 2022-09-04 ENCOUNTER — Ambulatory Visit: Payer: Medicaid Other | Admitting: Radiology

## 2022-09-04 ENCOUNTER — Encounter: Payer: Self-pay | Admitting: Radiology

## 2022-09-04 VITALS — BP 132/82 | Ht 62.0 in | Wt 177.0 lb

## 2022-09-04 DIAGNOSIS — Z3040 Encounter for surveillance of contraceptives, unspecified: Secondary | ICD-10-CM | POA: Diagnosis not present

## 2022-09-04 NOTE — Progress Notes (Signed)
   Nichole Green 09-01-1980 [id number]   History: 42 y.o. G3P3 presents for contraceptive counseling. Has Nexplanon in place, expired 07/2022. Would like another Nexplanon. Has not had any problems with this method. Up to date on health maintenance.  Gynecologic History Patient's last menstrual period was 09/03/2022 (exact date). Contraception: condoms and Nexplanon Last Pap: 2023. Results were: normal Last mammogram: 03/17/20. Results were: normal  Obstetric History OB History  Gravida Para Term Preterm AB Living  3 3 2 1  0 3  SAB IAB Ectopic Multiple Live Births  0 0 0 0 3    # Outcome Date GA Lbr Len/2nd Weight Sex Delivery Anes PTL Lv  3 Preterm 05/22/16 [redacted]w[redacted]d 37:30 7 lb 2 oz (3.232 kg) M Vag-Spont EPI  LIV  2 Term      Vag-Spont     1 Term      Vag-Spont       Obstetric Comments  Elevated BP with preg     The following portions of the patient's history were reviewed and updated as appropriate: allergies, current medications, past family history, past medical history, past social history, past surgical history, and problem list.  Review of Systems Pertinent items noted in HPI and remainder of comprehensive ROS otherwise negative.     Past medical history, past surgical history, family history and social history were all reviewed and documented in the EPIC chart.  ROS:  A ROS was performed and pertinent positives and negatives are included.  Exam:  Vitals:   09/04/22 1359  BP: 132/82  Weight: 177 lb (80.3 kg)  Height: 5\' 2"  (1.575 m)   Body mass index is 32.37 kg/m. Physical Exam Vitals and nursing note reviewed.  Constitutional:      Appearance: She is normal weight.  Pulmonary:     Effort: Pulmonary effort is normal.  Neurological:     General: No focal deficit present.     Mental Status: She is alert. Mental status is at baseline.  Psychiatric:        Mood and Affect: Mood normal.        Thought Content: Thought content normal.        Judgment: Judgment  normal.      Assessment/Plan: 1. Surveillance of contraceptive implant - Remove and insert drug implant; Future   Education given regarding options for contraception, including barrier methods, injectable contraception, IUD placement, oral contraceptives, Nexplanon .  RTO: ASAP for Nexplanon replacement. Use a back up method until insertion     [employer], WHNP-BC

## 2022-09-26 ENCOUNTER — Ambulatory Visit (INDEPENDENT_AMBULATORY_CARE_PROVIDER_SITE_OTHER): Payer: Medicaid Other | Admitting: Radiology

## 2022-09-26 ENCOUNTER — Encounter: Payer: Self-pay | Admitting: Radiology

## 2022-09-26 VITALS — BP 124/80

## 2022-09-26 DIAGNOSIS — Z01812 Encounter for preprocedural laboratory examination: Secondary | ICD-10-CM

## 2022-09-26 DIAGNOSIS — Z30017 Encounter for initial prescription of implantable subdermal contraceptive: Secondary | ICD-10-CM

## 2022-09-26 LAB — PREGNANCY, URINE: Preg Test, Ur: NEGATIVE

## 2022-09-26 MED ORDER — ETONOGESTREL 68 MG ~~LOC~~ IMPL
68.0000 mg | DRUG_IMPLANT | Freq: Once | SUBCUTANEOUS | Status: AC
  Administered 2022-09-26: 68 mg via SUBCUTANEOUS

## 2022-09-26 NOTE — Progress Notes (Signed)
42 y.o. [id number] female presents for Nexplanon replacement.  She has been counseled about alternative types of contraception and has decided this long acting method is still best for her.  Procedure, risks and benefits have all been explained.   After all questions were answered, consent was obtained.    Past Medical History:  Diagnosis Date   Headache    only when B/P elevated    Hypertension    Infection    UTI   Pregnancy induced hypertension     Past Surgical History:  Procedure Laterality Date   ABDOMINOPLASTY  2020   BREAST REDUCTION SURGERY     GASTRIC SLEEVE  2019   REDUCTION MAMMAPLASTY Bilateral 2010   ROBOTIC ASSISTED LAPAROSCOPIC OVARIAN CYSTECTOMY N/A 01/17/2016   Procedure: XI ROBOTIC ASSISTED LAPAROSCOPIC OVARIAN CYSTECTOMY;  Surgeon: Adolphus Birchwood, MD;  Location: [facility];  Service: Gynecology;  Laterality: N/A;    Current Outpatient Medications on File Prior to Visit  Medication Sig Dispense Refill   acetaminophen (TYLENOL) 500 MG tablet Take 500 mg by mouth every 6 (six) hours as needed for moderate pain or headache.     cyclobenzaprine (FLEXERIL) 10 MG tablet Take 1 tablet (10 mg total) by mouth 2 (two) times daily as needed for muscle spasms. 20 tablet 0   ibuprofen (ADVIL) 600 MG tablet Take 1 tablet (600 mg total) by mouth every 6 (six) hours as needed. 30 tablet 0   megestrol (MEGACE) 40 MG tablet Take 1 tablet (40 mg total) by mouth 2 (two) times daily. Can increase to two tablets twice a day in the event of heavy bleeding 60 tablet 5   metoprolol succinate (TOPROL-XL) 25 MG 24 hr tablet Take 25 mg by mouth daily.     Current Facility-Administered Medications on File Prior to Visit  Medication Dose Route Frequency Provider Last Rate Last Admin   etonogestrel (NEXPLANON) implant 68 mg  68 mg Subdermal Once Constant, Peggy, MD       No Known Allergies  Vitals:   09/26/22 0827  BP: 124/80   Physical Exam Vitals and nursing note reviewed. Exam  conducted with a chaperone present.  Constitutional:      Appearance: Normal appearance. She is normal weight.  Pulmonary:     Effort: Pulmonary effort is normal.  Neurological:     Mental Status: She is alert.  Psychiatric:        Mood and Affect: Mood normal.        Thought Content: Thought content normal.        Judgment: Judgment normal.     Procedure: Patient placed supine on exam table with her left arm flexed at the elbow. The prior insertion site was located and the Nexplanon rod was palpated.  Area cleansed with Betadine x 3 and draped in normal sterile fashion.  Insertion site and surrounding tissue anesthetized with 1% Lidocaine without epinephrine, 1 cc total used.  Small incision made with #11 blade.  Nexplanon removed without difficulty.  Attention was then turned to insertion of a new device.  After confirming presence of rod in device, skin was pierced and then elevated along insertion path, passing device just under the skin.  Rod released and device inserted.  Rod palpated easily by myself and patient.  Steri-strips applied and a pressure bandage was placed over site.  Entire procedure performed with sterile technique.   Assessment: Nexplanon exchange  Plan:  Post procedure instructions reviewed with pt.  Questions answered.  Pt  knows to call with any concerns or questions.  Pt is aware removal is due by 3 calendar years from today.

## 2022-10-01 ENCOUNTER — Encounter: Payer: Self-pay | Admitting: Radiology

## 2022-10-04 ENCOUNTER — Ambulatory Visit: Payer: Medicaid Other | Admitting: Obstetrics and Gynecology

## 2022-10-11 ENCOUNTER — Encounter: Payer: Self-pay | Admitting: Radiology

## 2022-10-11 ENCOUNTER — Ambulatory Visit: Payer: Medicaid Other | Admitting: Radiology

## 2022-10-11 VITALS — BP 110/80 | HR 74 | Resp 16

## 2022-10-11 DIAGNOSIS — L089 Local infection of the skin and subcutaneous tissue, unspecified: Secondary | ICD-10-CM | POA: Diagnosis not present

## 2022-10-11 DIAGNOSIS — Z975 Presence of (intrauterine) contraceptive device: Secondary | ICD-10-CM | POA: Diagnosis not present

## 2022-10-11 MED ORDER — SULFAMETHOXAZOLE-TRIMETHOPRIM 800-160 MG PO TABS: 1.0000 | ORAL_TABLET | Freq: Two times a day (BID) | ORAL | 0 refills | Status: DC

## 2022-10-11 NOTE — Progress Notes (Signed)
   Nichole Green 1980-12-11 [id number]   History:  42 y.o. s/p Nexplanon replacement 09/26/22 patient reports she did not leave her bandage wrap and steri strips on past that evening despite being advised to leave on for minimum 24 hours for wrap and until steri strip fell off. Has noticed some redness and swelling, tender to touch. Denies any fever or chills, no drainage from insertion site.  Gynecologic History No LMP recorded. Patient has had an implant.    Obstetric History OB History  Gravida Para Term Preterm AB Living  3 3 2 1  0 3  SAB IAB Ectopic Multiple Live Births  0 0 0 0 3    # Outcome Date GA Lbr Len/2nd Weight Sex Type Anes PTL Lv  3 Preterm 05/22/16 [redacted]w[redacted]d 37:30 7 lb 2 oz (3.232 kg) M Vag-Spont EPI  LIV  2 Term      Vag-Spont     1 Term      Vag-Spont       Obstetric Comments  Elevated BP with preg     The following portions of the patient's history were reviewed and updated as appropriate: allergies, current medications, past family history, past medical history, past social history, past surgical history, and problem list.  Review of Systems Pertinent items noted in HPI and remainder of comprehensive ROS otherwise negative.   Past medical history, past surgical history, family history and social history were all reviewed and documented in the EPIC chart.   Exam:  Vitals:   10/11/22 1610  BP: 110/80  Pulse: 74  Resp: 16   There is no height or weight on file to calculate BMI.  Physical Exam Vitals and nursing note reviewed.  Constitutional:      Appearance: Normal appearance. She is normal weight.  Pulmonary:     Effort: Pulmonary effort is normal.  Skin:    Findings: Erythema present.     Comments: At site of nexplanon placement. 5cmx2cm area. Scant amount of purulent drainage present at the insertion site when pressure was applied. Nexplanon in the correct location, no partial expulsion.   Cleansed with peroxide, betadine applied and closed with  steri strip. Adhesive bandage applied over insertion site.  Neurological:     Mental Status: She is alert.      Assessment/Plan:   1. Nexplanon in place  2. Skin infection - sulfamethoxazole-trimethoprim (BACTRIM DS) 800-160 MG tablet; Take 1 tablet by mouth 2 (two) times daily.  Dispense: 10 tablet; Refill: 0   Keep adhesive bandage on x 24 hours, change daily, leave steri strip on until it falls off on its own. Keep area clean and dry. Take all antibiotics as prescribed. Call if no improvement after antibiotic course. Call for fever, chills, worsening redness or pain.  Arlie Solomons B WHNP-BC 4:18 PM 10/11/2022

## 2022-11-01 ENCOUNTER — Ambulatory Visit (INDEPENDENT_AMBULATORY_CARE_PROVIDER_SITE_OTHER): Payer: Medicaid Other | Admitting: Nurse Practitioner

## 2022-11-01 ENCOUNTER — Telehealth: Payer: Self-pay

## 2022-11-01 ENCOUNTER — Ambulatory Visit: Payer: Medicaid Other | Admitting: Radiology

## 2022-11-01 VITALS — BP 112/64 | HR 88 | Temp 97.9°F | Wt 174.0 lb

## 2022-11-01 DIAGNOSIS — Z3046 Encounter for surveillance of implantable subdermal contraceptive: Secondary | ICD-10-CM | POA: Diagnosis not present

## 2022-11-01 DIAGNOSIS — L089 Local infection of the skin and subcutaneous tissue, unspecified: Secondary | ICD-10-CM | POA: Diagnosis not present

## 2022-11-01 MED ORDER — DOXYCYCLINE MONOHYDRATE 100 MG PO CAPS
100.0000 mg | ORAL_CAPSULE | Freq: Two times a day (BID) | ORAL | 0 refills | Status: AC
Start: 2022-11-01 — End: 2022-11-11

## 2022-11-01 NOTE — Telephone Encounter (Signed)
Pt notified and voiced understanding to come on in ASAP. Will notify front desk/clinical staff (if not done already) to let us know when she arrives.   Routing to providers for final review and closing.

## 2022-11-01 NOTE — Telephone Encounter (Signed)
She needs nexplanon removed if still having symptoms. Bring her in and one of [place] will see her.

## 2022-11-01 NOTE — Progress Notes (Signed)
42 y.o. [id number] presents for Nexplanon exchange.  Nexplanon inserted 09/26/2022 by Chelsea Primus, NP. Patient returned 10/11/22 with pain, erythema and drainage at site. Steri strips removed prior to recommended time. Started on Bactrim. Symptoms mildly better but still present. Removal recommended today. She would like new Nexplanon inserted into right arm today.   LMP:  UNK   After all questions were answered, consent was obtained.    Past Medical History:  Diagnosis Date   Headache    only when B/P elevated    Hypertension    Infection    UTI   Pregnancy induced hypertension     Past Surgical History:  Procedure Laterality Date   ABDOMINOPLASTY  2020   BREAST REDUCTION SURGERY     GASTRIC SLEEVE  2019   REDUCTION MAMMAPLASTY Bilateral 2010   ROBOTIC ASSISTED LAPAROSCOPIC OVARIAN CYSTECTOMY N/A 01/17/2016   Procedure: XI ROBOTIC ASSISTED LAPAROSCOPIC OVARIAN CYSTECTOMY;  Surgeon: Adolphus Birchwood, MD;  Location: [facility];  Service: Gynecology;  Laterality: N/A;    Current Outpatient Medications on File Prior to Visit  Medication Sig Dispense Refill   acetaminophen (TYLENOL) 500 MG tablet Take 500 mg by mouth every 6 (six) hours as needed for moderate pain or headache.     cyclobenzaprine (FLEXERIL) 10 MG tablet Take 1 tablet (10 mg total) by mouth 2 (two) times daily as needed for muscle spasms. 20 tablet 0   metoprolol succinate (TOPROL-XL) 25 MG 24 hr tablet Take 25 mg by mouth daily.     No current facility-administered medications on file prior to visit.   No Known Allergies  Vitals:   11/01/22 1144  BP: 112/64  Pulse: 88  Temp: 97.9 F (36.6 C)  SpO2: 100%   Physical Exam Skin:         Procedure: Patient placed supine on exam table with her left arm flexed at the elbow. Erythema, small amount of yellow drainage at middle part of rod and not at insertion site. Mild erythema at insertion site. The prior insertion site was located and the Nexplanon rod was palpated.  Area  cleansed with Chlorhexidine x 3 and draped in normal sterile fashion.  Insertion site and surrounding tissue anesthetized with 1% Lidocaine without epinephrine, 2 cc total used.  Small incision made with #11 blade.  Nexplanon removed without difficulty.  Steri-strips were applied. Culture obtained. Pressure bandage applied.   New location site was identified on right arm, 8-10 cm from epicondyle and 3-5 cm posterior.  Area cleansed with Chlorhexidine x 3.  Insertion site and path of insertion was anesthetized with 1% Lidocaine without epinephrine, 1.5 cc total.  Using Nexplanon device (and after confirming presence of rod in device), skin was pierced and then elevated along insertion path, passing device just under the skin.  Rod released and device inserted.  Rod palpated easily.  Steri-strips applied and a pressure bandage was placed over both sites.  Entire procedure performed with sterile technique.   Assessment: Nexplanon exchange  Encounter for removal and reinsertion of Nexplanon - Plan: Insertion of implanon rod, Removal of implanon rod  Skin infection - Plan: doxycycline (MONODOX) 100 MG capsule BID x 10 days, WOUND CULTURE.  Plan:  Post procedure instructions reviewed with pt.  Complete full course of antibiotics. Culture pending. Do not remove outer bandage for 24 hours and  steri strips for minimum of 7 days. Keep area clean and covered.  Questions answered.  Pt knows to call with any concerns or questions. Pt is aware  removal is due by 3 calendar years from today.

## 2022-11-01 NOTE — Telephone Encounter (Signed)
Pt had appt this AM @ 930 for nexplanon site check up. Was seen on 10/11/2022 for skin infection. Was provided bactrim rx at that time, pt reports that she took all the medication and still having sxs. Was not able to make it to 930 appt time due to sick child emergency but desires to be seen today due to sxs of: pain at the site, hot to touch, swelling, yellow d/c, and intermittent chills (unable to check temp).   States that she could be here within an hr if could be seen today (is hoping we can see her today). States she has tried to call another office for appt but they refused to see her, informed her that she would have to be seen by inserter of implant. Please advise.

## 2022-11-14 ENCOUNTER — Ambulatory Visit: Payer: Medicaid Other | Admitting: Radiology

## 2022-11-15 ENCOUNTER — Other Ambulatory Visit: Payer: Self-pay | Admitting: Family Medicine

## 2022-11-15 DIAGNOSIS — Z1231 Encounter for screening mammogram for malignant neoplasm of breast: Secondary | ICD-10-CM

## 2022-11-20 ENCOUNTER — Ambulatory Visit: Payer: Medicaid Other | Admitting: Radiology

## 2022-11-20 VITALS — BP 112/78

## 2022-11-20 DIAGNOSIS — R21 Rash and other nonspecific skin eruption: Secondary | ICD-10-CM | POA: Diagnosis not present

## 2022-11-20 DIAGNOSIS — K649 Unspecified hemorrhoids: Secondary | ICD-10-CM

## 2022-11-20 MED ORDER — TRIAMCINOLONE ACETONIDE 0.5 % EX OINT: 1.0000 | TOPICAL_OINTMENT | Freq: Two times a day (BID) | CUTANEOUS | 0 refills | Status: DC

## 2022-11-20 NOTE — Progress Notes (Signed)
      Subjective: Nichole Green is a 42 y.o. female who complains of itchy dark spots near anus x 3 weeks. No lesions or warts noted.   - Sexually active with partner(s) - Last sexual encounter: last week -Contraception: Nexplanon  Review of Systems  All other systems reviewed and are negative.   Past Medical History:  Diagnosis Date   Headache    only when B/P elevated    Hypertension    Infection    UTI   Pregnancy induced hypertension       Objective:  Today's Vitals   11/20/22 1522  BP: 112/78   There is no height or weight on file to calculate BMI.   Physical Exam Vitals and nursing note reviewed.  Constitutional:      Appearance: Normal appearance. She is normal weight.  Genitourinary:      Comments: Hyperpigmented flat lesions around anus with associated mild erythema.  Neurological:     Mental Status: She is alert.  Psychiatric:        Mood and Affect: Mood normal.        Thought Content: Thought content normal.        Judgment: Judgment normal.      Raynelle Fanning, CMA present for exam  Assessment:/Plan:   1. Rash of perineum - triamcinolone ointment (KENALOG) 0.5 %; Apply 1 Application topically 2 (two) times daily.  Dispense: 30 g; Refill: 0  If no improvement after 1 week return for biopsy  Arlie Solomons, [facility]

## 2022-11-21 NOTE — Telephone Encounter (Signed)
It does not appear to be a contagious rash, I think it is from heat.

## 2022-11-22 NOTE — Telephone Encounter (Signed)
Reviewed recent OV notes, no recommendations regarding hemorrhoids. Routing to [facility] to review and place referral.

## 2022-11-22 NOTE — Telephone Encounter (Signed)
May refer to [facility] [place] [facility] for consult [id number] removal

## 2022-11-22 NOTE — Telephone Encounter (Signed)
Referral placed.   Patient notified .   Routing to [employer].   Encounter closed.

## 2022-12-19 ENCOUNTER — Ambulatory Visit: Payer: Medicaid Other | Admitting: Obstetrics and Gynecology

## 2023-01-21 ENCOUNTER — Encounter: Payer: Self-pay | Admitting: Nurse Practitioner

## 2023-01-23 ENCOUNTER — Ambulatory Visit: Payer: Medicaid Other

## 2023-01-23 NOTE — Telephone Encounter (Signed)
Spoke with patient. Patient reports no menses since nexplanon exchanged 11/01/22. Patient states she was having regular menses prior to exchange. Negative UPT at home. Reports lower back pain and menses like cramps with Nausea for several days. Reports Hx of constipation, states she has resolved this with surgery. Hx gastric sleeve. Denies urinary symptoms, fever/chills, vomiting.   Patient is requesting OV to further discuss, states she is concerned not having a menses is making her feel this way.   OV scheduled for 10/31 at 0930 with TW.   Routing to provider for final review. Patient is agreeable to disposition. Will close encounter.

## 2023-01-24 ENCOUNTER — Encounter: Payer: Self-pay | Admitting: Nurse Practitioner

## 2023-01-24 ENCOUNTER — Ambulatory Visit: Payer: Medicaid Other | Admitting: Nurse Practitioner

## 2023-01-24 VITALS — BP 112/82 | HR 68

## 2023-01-24 DIAGNOSIS — Z975 Presence of (intrauterine) contraceptive device: Secondary | ICD-10-CM | POA: Diagnosis not present

## 2023-01-24 DIAGNOSIS — R21 Rash and other nonspecific skin eruption: Secondary | ICD-10-CM | POA: Diagnosis not present

## 2023-01-24 DIAGNOSIS — N912 Amenorrhea, unspecified: Secondary | ICD-10-CM

## 2023-01-24 DIAGNOSIS — R109 Unspecified abdominal pain: Secondary | ICD-10-CM

## 2023-01-24 MED ORDER — TRIAMCINOLONE ACETONIDE 0.5 % EX OINT: 1.0000 | TOPICAL_OINTMENT | Freq: Two times a day (BID) | CUTANEOUS | 0 refills | Status: DC

## 2023-01-24 NOTE — Progress Notes (Signed)
   Acute Office Visit  Subjective:    Patient ID: Nichole Green, female    DOB: [birth year], 42 y.o.   MRN: [id number]   HPI 42 y.o. [id number] presents today for amenorrhea since Nexplanon exchange in August. Had monthly cycles with previous Nexplanon, so it is concerning to her. Negative UPT at home yesterday. Also complains of rash around anus that is itchy. Had same rash back in August and was treated with kenalog. Rash went away at that time. Uses wet wipes or water sometimes for wiping.   No LMP recorded. Patient has had an implant.    Review of Systems  Constitutional: Negative.   Genitourinary:  Positive for menstrual problem.  Skin:  Positive for rash (Anus).       Objective:    Physical Exam Constitutional:      Appearance: Normal appearance.  Genitourinary:      Comments: Hyperpigmented lesions, some slightly raised, mild erythema    BP 112/82   Pulse 68   SpO2 99%  Wt Readings from Last 3 Encounters:  11/01/22 174 lb (78.9 kg)  09/04/22 177 lb (80.3 kg)  03/08/22 199 lb (90.3 kg)        Patient informed chaperone available to be present for breast and/or pelvic exam. Patient has requested no chaperone to be present. Patient has been advised what will be completed during breast and pelvic exam.   Assessment & Plan:   Problem List Items Addressed This Visit   None Visit Diagnoses     Amenorrhea    -  Primary   Rash of perineum       Relevant Medications   triamcinolone ointment (KENALOG) 0.5 %   Nexplanon in place          Plan: Reassurance provided on normal bleeding patterns that can occur with Nexplanon, including amenorrhea. Neg UPT. For rash, apply Kenalog ointment BID x 7-10 days. Avoid wet wipes/water with wiping or make sure to pat dry after use to avoid recurrent dermatitis.   Return if symptoms worsen or fail to improve.    Olivia Mackie DNP, 9:57 AM 01/24/2023

## 2023-02-14 ENCOUNTER — Ambulatory Visit: Payer: Medicaid Other

## 2023-02-20 ENCOUNTER — Ambulatory Visit: Payer: Medicaid Other

## 2023-07-22 ENCOUNTER — Encounter: Payer: Self-pay | Admitting: Nurse Practitioner

## 2023-07-22 ENCOUNTER — Ambulatory Visit: Admitting: Nurse Practitioner

## 2023-07-22 VITALS — BP 114/82 | Resp 16

## 2023-07-22 DIAGNOSIS — B9689 Other specified bacterial agents as the cause of diseases classified elsewhere: Secondary | ICD-10-CM

## 2023-07-22 DIAGNOSIS — N898 Other specified noninflammatory disorders of vagina: Secondary | ICD-10-CM

## 2023-07-22 DIAGNOSIS — N760 Acute vaginitis: Secondary | ICD-10-CM | POA: Diagnosis not present

## 2023-07-22 MED ORDER — METRONIDAZOLE 500 MG PO TABS: 500.0000 mg | ORAL_TABLET | Freq: Two times a day (BID) | ORAL | 0 refills | Status: DC

## 2023-07-22 NOTE — Progress Notes (Addendum)
   Acute Office Visit  Subjective:    Patient ID: Nichole Green, female    DOB: 1980-11-15, 43 y.o.   MRN: [id number]   HPI 43 y.o. presents today for vaginal odor that she noticed last week. Denies itching or discharge. Feels like odor has resolved. Sexually active. Declines STD screening.   Patient's last menstrual period was 06/13/2023 (approximate). Period Cycle (Days): 28 Period Duration (Days): 10 Period Pattern: Regular Menstrual Flow: Heavy Menstrual Control: Maxi pad Dysmenorrhea: (!) Severe Dysmenorrhea Symptoms: Cramping, Headache  Review of Systems  Constitutional: Negative.   Genitourinary:  Negative for vaginal discharge and vaginal pain.       Vaginal odor       Objective:    Physical Exam Constitutional:      Appearance: Normal appearance.  Genitourinary:    General: Normal vulva.     Vagina: Vaginal discharge present. No erythema.     Cervix: Normal.     BP 114/82 (BP Location: Left Arm, Patient Position: Sitting, Cuff Size: Normal)   Resp 16   LMP 06/13/2023 (Approximate)  Wt Readings from Last 3 Encounters:  11/01/22 174 lb (78.9 kg)  09/04/22 177 lb (80.3 kg)  03/08/22 199 lb (90.3 kg)        Doria Garden, NP student present as chaperone.   Wet prep + clue cells (+ odor)  Assessment & Plan:   Problem List Items Addressed This Visit   None Visit Diagnoses       Bacterial vaginosis    -  Primary   Relevant Medications   metroNIDAZOLE  (FLAGYL ) 500 MG tablet     Vaginal odor       Relevant Orders   WET PREP FOR TRICH, YEAST, CLUE      Plan: Wet prep positive for clue cells - Flagyl  500 mg BID x 7 days.   Return if symptoms worsen or fail to improve.    Andee Bamberger DNP, 11:15 AM 07/22/2023

## 2023-07-23 LAB — WET PREP FOR TRICH, YEAST, CLUE

## 2023-09-19 ENCOUNTER — Ambulatory Visit: Payer: Medicaid Other | Admitting: Dermatology

## 2023-09-19 ENCOUNTER — Encounter: Payer: Self-pay | Admitting: Dermatology

## 2023-09-19 VITALS — BP 129/87

## 2023-09-19 DIAGNOSIS — L609 Nail disorder, unspecified: Secondary | ICD-10-CM

## 2023-09-19 DIAGNOSIS — L905 Scar conditions and fibrosis of skin: Secondary | ICD-10-CM

## 2023-09-19 DIAGNOSIS — B351 Tinea unguium: Secondary | ICD-10-CM

## 2023-09-19 DIAGNOSIS — Z139 Encounter for screening, unspecified: Secondary | ICD-10-CM

## 2023-09-19 NOTE — Progress Notes (Signed)
   New Patient Visit   Subjective  Nichole Green is a 43 y.o. female who presents for the following: New PT - Nail Fungus  Patient is here today to have her toe examined. She believes she has toe nail fungus despite this issue has been going on since she was a child. Toe nails are thick & painful when wearing shoes. Patient was seen years ago in regard and was given a pill and used OTC nail fungus solution but both were ineffective. Denied Hx of Bx. No family Hx of skin cancer.   The following portions of the chart were reviewed this encounter and updated as appropriate: medications, allergies, medical history  Review of Systems:  No other skin or systemic complaints except as noted in HPI or Assessment and Plan.  Objective  Well appearing patient in no apparent distress; mood and affect are within normal limits.   A focused examination was performed of the following areas: toenails   Relevant exam findings are noted in the Assessment and Plan.             Assessment & Plan   1. Suspected Onychomycosis - Assessment: History of nail fungus treated unsuccessfully with oral medication and over-the-counter treatments about 10 years ago. Current examination reveals subungual debris and nail lifting, with abnormal nail growth affecting shoe wear. Differential diagnosis includes onychomycosis and mechanical trauma.  - Plan:    Obtain nail clippings for fungal culture    Order liver function panel    If fungal culture positive and liver function normal:     - Prescribe terbinafine 250 mg PO daily for 3 months    Advise patient to avoid nail polish for effective topical treatment if prescribed    Inform patient that normal nail growth will take time after treatment initiation  2. Post-surgical Hypertrophic Scars - Assessment: Patient reports tight, possibly keloid scars under both arms following surgery 6 months ago. Previous treatment with an unspecified cream was ineffective. Clinical  presentation is consistent with hypertrophic scars rather than keloids based on the description and timing. - Plan:    Recommend Psilogen scar gel (medical-grade silicone with sunscreen) for daily application and massage    Educate patient on proper application technique to prevent scar discoloration    If no improvement, consider Kenalog  injections at next follow-up visit  Follow-up as needed for reassessment of both conditions and evaluation of treatment efficacy   No follow-ups on file.    Documentation: I have reviewed the above documentation for accuracy and completeness, and I agree with the above.  I, Shirron Maranda, CMA, am acting as scribe for [employer], DO.   Delon Lenis, DO

## 2023-09-19 NOTE — Patient Instructions (Addendum)
 Date: Thu Sep 19 2023  Hello Harrel Dryer,  Thank you for visiting today. Here is a summary of the key instructions:  - Medications:   - If fungal culture is positive, take terbinafine 250 mg daily for 3 months  - Lab Tests:   - Liver function panel before starting terbinafine   - Fungal culture of toenail clippings  - Skin Care:   - Apply Silagen scar gel daily to arm scars   - Massage the gel into the scars to prevent purple discoloration  - Follow-up:   - Return for next visit to check on scar progress   - If scars don't soften, Kenalog  injections may be considered  - Other Instructions:   - Remove nail polish before using any topical treatments for toenails   - Wait for normal nails to grow out after starting treatment  Please reach out if you have any questions or concerns.  Warm regards,  Dr. Delon Lenis [facility]      Important Information  Due to recent changes in healthcare laws, you may see results of your pathology and/or laboratory studies on MyChart before the doctors have had a chance to review them. We understand that in some cases there may be results that are confusing or concerning to you. Please understand that not all results are received at the same time and often the doctors may need to interpret multiple results in order to provide you with the best plan of care or course of treatment. Therefore, we ask that you please give us  2 business days to thoroughly review all your results before contacting the office for clarification. Should we see a critical lab result, you will be contacted sooner.   If You Need Anything After Your Visit  If you have any questions or concerns for your doctor, please call our main line at [phone] If no one answers, please leave a voicemail as directed and we will return your call as soon as possible. Messages left after 4 pm will be answered the following business day.   You may also send us  a message via  MyChart. We typically respond to MyChart messages within 1-2 business days.  For prescription refills, please ask your pharmacy to contact our office. Our fax number is [phone].  If you have an urgent issue when the clinic is closed that cannot wait until the next business day, you can page your doctor at the number below.    Please note that while we do our best to be available for urgent issues outside of office hours, we are not available 24/7.   If you have an urgent issue and are unable to reach us , you may choose to seek medical care at your doctor's office, retail clinic, urgent care center, or emergency room.  If you have a medical emergency, please immediately call 911 or go to the emergency department. In the event of inclement weather, please call our main line at [phone] for an update on the status of any delays or closures.  Dermatology Medication Tips: Please keep the boxes that topical medications come in in order to help keep track of the instructions about where and how to use these. Pharmacies typically print the medication instructions only on the boxes and not directly on the medication tubes.   If your medication is too expensive, please contact our office at [phone] or send us  a message through MyChart.   We are unable to tell what your co-pay for medications will  be in advance as this is different depending on your insurance coverage. However, we may be able to find a substitute medication at lower cost or fill out paperwork to get insurance to cover a needed medication.   If a prior authorization is required to get your medication covered by your insurance company, please allow us  1-2 business days to complete this process.  Drug prices often vary depending on where the prescription is filled and some pharmacies may offer cheaper prices.  The website www.goodrx.com contains coupons for medications through different pharmacies. The prices here do not  account for what the cost may be with help from insurance (it may be cheaper with your insurance), but the website can give you the price if you did not use any insurance.  - You can print the associated coupon and take it with your prescription to the pharmacy.  - You may also stop by our office during regular business hours and pick up a GoodRx coupon card.  - If you need your prescription sent electronically to a different pharmacy, notify our office through [facility] or by phone at [phone]

## 2023-09-26 LAB — COMPREHENSIVE METABOLIC PANEL WITH GFR
ALT: 7 IU/L (ref 0–32)
AST: 9 IU/L (ref 0–40)
Albumin: 4.2 g/dL (ref 3.9–4.9)
Alkaline Phosphatase: 63 IU/L (ref 44–121)
BUN/Creatinine Ratio: 21 (ref 9–23)
BUN: 14 mg/dL (ref 6–24)
Bilirubin Total: 0.5 mg/dL (ref 0.0–1.2)
CO2: 22 mmol/L (ref 20–29)
Calcium: 9.3 mg/dL (ref 8.7–10.2)
Chloride: 105 mmol/L (ref 96–106)
Creatinine, Ser: 0.67 mg/dL (ref 0.57–1.00)
Globulin, Total: 2.7 g/dL (ref 1.5–4.5)
Glucose: 85 mg/dL (ref 70–99)
Potassium: 4.8 mmol/L (ref 3.5–5.2)
Sodium: 140 mmol/L (ref 134–144)
Total Protein: 6.9 g/dL (ref 6.0–8.5)
eGFR: 111 mL/min/{1.73_m2} (ref >59)

## 2023-10-07 ENCOUNTER — Ambulatory Visit: Payer: Self-pay | Admitting: Dermatology

## 2023-10-10 LAB — FUNGUS CULTURE W SMEAR

## 2023-10-10 LAB — SPECIMEN STATUS REPORT

## 2023-10-22 ENCOUNTER — Telehealth (INDEPENDENT_AMBULATORY_CARE_PROVIDER_SITE_OTHER): Payer: Self-pay | Admitting: Otolaryngology

## 2023-10-22 NOTE — Telephone Encounter (Signed)
 Confirmed appt & location [id number] afm

## 2023-10-24 ENCOUNTER — Institutional Professional Consult (permissible substitution) (INDEPENDENT_AMBULATORY_CARE_PROVIDER_SITE_OTHER): Admitting: Otolaryngology

## 2023-11-04 ENCOUNTER — Telehealth: Payer: Self-pay

## 2023-11-04 NOTE — Telephone Encounter (Signed)
 Pt LVM stating that she has not received Rx for her toe nails.   Per last OV 09/19/23 patient was worked up for onychomycosis with a notation stating that if fungal culture was positive she would start Lamisil for 3 mo. However, culture report showed no fungal growth. Ok to inform pt that Rx is not needed? Please advise.

## 2023-11-05 NOTE — Telephone Encounter (Signed)
 Pt has been informed and expressed understanding. She will try OTC Kerasol patches and follow up an next OV schedule in December.

## 2023-11-05 NOTE — Telephone Encounter (Signed)
 Yes, please let pt know that the culture was negative for fungal growth and that the abnormal nails is most likely dystrophy from aging and micro traumas over the years.  She can try OTC Kerasol Patches to help improve the appearance of the nails but an antifungal wouldn't help.  - Dr Alm

## 2023-12-13 ENCOUNTER — Institutional Professional Consult (permissible substitution) (INDEPENDENT_AMBULATORY_CARE_PROVIDER_SITE_OTHER): Admitting: Otolaryngology

## 2024-02-24 ENCOUNTER — Ambulatory Visit: Admitting: Dermatology

## 2024-02-24 ENCOUNTER — Encounter: Payer: Self-pay | Admitting: Dermatology

## 2024-02-24 VITALS — BP 148/88 | HR 81

## 2024-02-24 DIAGNOSIS — B351 Tinea unguium: Secondary | ICD-10-CM | POA: Diagnosis not present

## 2024-02-24 DIAGNOSIS — L910 Hypertrophic scar: Secondary | ICD-10-CM

## 2024-02-24 MED ORDER — JUBLIA 10 % EX SOLN: CUTANEOUS | 8 refills | Status: AC

## 2024-02-24 MED ORDER — TRIAMCINOLONE ACETONIDE 10 MG/ML IJ SUSP
10.0000 mg | Freq: Once | INTRAMUSCULAR | Status: AC
  Administered 2024-02-24: 10 mg

## 2024-02-24 NOTE — Progress Notes (Unsigned)
   Follow-Up Visit  Patient (and/or pt guardian) consented to the use of AI-assisted tools for note generation.    Subjective  Nichole Green is a 42 y.o. female who presents for the following: Onychomycosis - lab results and Keloid  Patient was last evaluated on 09/19/23.  At this visit nail clippings were taken for fungal culture - negative for mold and fungus Patient advised to use kerasol patches Liver function panel was ordered  Patient reports sxs are unchanged.  Patient denies medication changes. Patient currently has nail polish on toe  Keloid - recommended using silicone scar sheets - Psilogen - discussed Kenalog  injections for next appointment if no improvement with scar sheets  The following portions of the chart were reviewed this encounter and updated as appropriate: medications, allergies, medical history  Review of Systems:  No other skin or systemic complaints except as noted in HPI or Assessment and Plan.  Objective  Well appearing patient in no apparent distress; mood and affect are within normal limits.  A focused examination was performed of the following areas: Foot and BIL underarms  Relevant exam findings are noted in the Assessment and Plan.        Left Upper Arm - Anterior, Right Upper Arm - Anterior, Right Upper Back Firm pink/brown dermal papule(s)/plaque(s).   Assessment & Plan  ONYCHOMYCOSIS Exam: Thickened toenails with subungal debris c/w onychomycosis  Treatment Plan: Prescribed Jublia  to use daily on nails. Sent to [employer] using a file to help with thickness of nail  KELOID Exam shows Firm pink/brown dermal papule(s)/plaque(s).  Treatment Plan: Intralesional injections  Location: Bilateral upper arms, bilateral axilla  Informed Consent: Discussed risks (infection, pain, bleeding, bruising, thinning of the skin, loss of skin pigment, lack of resolution, and recurrence of lesion) and benefits of the procedure, as  well as the alternatives. Informed consent was obtained. Preparation: The area was prepared a standard fashion.  Procedure Details: An intralesional injection was performed with Kenalog  10 mg/cc. .25 cc in total were injected. NDC: 9996-9505-79 LOT: [id number] EXP: June 23, 2024  Total number of injections: 9  Plan: The patient was instructed on post-op care. Recommend OTC analgesia as needed for pain.    ONYCHOMYCOSIS   Related Medications Efinaconazole  (JUBLIA ) 10 % SOLN One application daily to affected nails KELOID (3) Left Upper Arm - Anterior, Right Upper Arm - Anterior, Right Upper Back Intralesional injection - Left Upper Arm - Anterior, Right Upper Arm - Anterior, Right Upper Back   Kenalog  10 mg/cc NDC: 9996-9505-79 LOT: [id number] EXP: June 23, 2024  Related Medications triamcinolone  acetonide (KENALOG ) 10 MG/ML injection 10 mg   Return if symptoms worsen or fail to improve.  I, Lyle Cords, as acting as a neurosurgeon for [employer], DO .   Documentation: I have reviewed the above documentation for accuracy and completeness, and I agree with the above.  Delon Lenis, DO

## 2024-02-24 NOTE — Patient Instructions (Signed)

## 2024-02-26 ENCOUNTER — Ambulatory Visit: Admitting: Dermatology

## 2024-03-06 ENCOUNTER — Other Ambulatory Visit: Payer: Self-pay | Admitting: Family Medicine

## 2024-03-06 DIAGNOSIS — Z1231 Encounter for screening mammogram for malignant neoplasm of breast: Secondary | ICD-10-CM

## 2024-04-07 ENCOUNTER — Ambulatory Visit: Payer: Self-pay | Admitting: Nurse Practitioner

## 2024-04-07 ENCOUNTER — Encounter: Payer: Self-pay | Admitting: Nurse Practitioner

## 2024-04-07 VITALS — BP 112/74 | HR 70 | Temp 97.4°F | Resp 16

## 2024-04-07 DIAGNOSIS — Z9889 Other specified postprocedural states: Secondary | ICD-10-CM

## 2024-04-07 DIAGNOSIS — R102 Pelvic and perineal pain unspecified side: Secondary | ICD-10-CM

## 2024-04-07 DIAGNOSIS — Z8742 Personal history of other diseases of the female genital tract: Secondary | ICD-10-CM

## 2024-04-07 LAB — WET PREP FOR TRICH, YEAST, CLUE

## 2024-04-07 NOTE — Progress Notes (Signed)
" ° °  Acute Office Visit  Subjective:    Patient ID: Nichole Green, female    DOB: [birth year], 44 y.o.   MRN: [id number]   HPI 44 y.o. presents today for pelvic pain x 2 days. Pain was sharp, crampy, achy and radiated to back, bilateral. Has improved some. Ovarian cystectomy in 2017 due to size while pregnant, benign. Denies urinary or vaginal symptoms. H/O chronic constipation. Nexplanon . Husband present.   Patient's last menstrual period was 03/14/2024 (exact date). Period Duration (Days): 7 Menstrual Flow: Heavy Menstrual Control: Maxi pad Dysmenorrhea: (!) Severe Dysmenorrhea Symptoms: Headache, Nausea  Review of Systems  Constitutional: Negative.   Genitourinary:  Positive for pelvic pain. Negative for dysuria, flank pain, frequency, hematuria, menstrual problem, urgency, vaginal discharge and vaginal pain.       Objective:    Physical Exam Exam conducted with a chaperone present.  Constitutional:      Appearance: Normal appearance.  Genitourinary:    General: Normal vulva.     Vagina: Normal.     Cervix: Normal.     Uterus: Normal.      Adnexa: Right adnexa normal and left adnexa normal.     BP 112/74   Pulse 70   Temp (!) 97.4 F (36.3 C) (Oral)   Resp 16   LMP 03/14/2024 (Exact Date)  Wt Readings from Last 3 Encounters:  11/01/22 174 lb (78.9 kg)  09/04/22 177 lb (80.3 kg)  03/08/22 199 lb (90.3 kg)        Nichole Green, CMA present as biomedical engineer.   UA negative  Wet prep negative  UPT negative  Assessment & Plan:   Problem List Items Addressed This Visit   None Visit Diagnoses       Pelvic pain    -  Primary   Relevant Orders   WET PREP FOR TRICH, YEAST, CLUE   Pregnancy, urine   Urinalysis,Complete w/RFL Culture   C. trachomatis/N. gonorrhoeae RNA   US  PELVIS TRANSVAGINAL NON-OB (TV ONLY)     History of ovarian cystectomy       Relevant Orders   US  PELVIS TRANSVAGINAL NON-OB (TV ONLY)       Plan: Negative UA and wet prep. GC/CT pending.  Schedule ultrasound.     Nichole DELENA Shutter DNP, 12:23 PM 04/07/2024 "

## 2024-04-08 LAB — C. TRACHOMATIS/N. GONORRHOEAE RNA
C. trachomatis RNA, TMA: NOT DETECTED
N. gonorrhoeae RNA, TMA: NOT DETECTED

## 2024-04-09 ENCOUNTER — Ambulatory Visit: Payer: Self-pay | Admitting: Nurse Practitioner

## 2024-04-09 LAB — URINALYSIS, COMPLETE W/RFL CULTURE
Bacteria, UA: NONE SEEN /HPF
Bilirubin Urine: NEGATIVE
Glucose, UA: NEGATIVE
Hyaline Cast: NONE SEEN /LPF
Ketones, ur: NEGATIVE
Leukocyte Esterase: NEGATIVE
Nitrites, Initial: NEGATIVE
Protein, ur: NEGATIVE
Specific Gravity, Urine: 1.027 (ref 1.001–1.035)
pH: 6 (ref 5.0–8.0)

## 2024-04-09 LAB — URINE CULTURE
MICRO NUMBER:: 17461870
Result:: NO GROWTH
SPECIMEN QUALITY:: ADEQUATE

## 2024-04-09 LAB — PREGNANCY, URINE: Preg Test, Ur: NEGATIVE

## 2024-04-09 LAB — CULTURE INDICATED

## 2024-04-13 ENCOUNTER — Ambulatory Visit: Admitting: Podiatry

## 2024-04-21 ENCOUNTER — Other Ambulatory Visit: Payer: Self-pay

## 2024-04-21 ENCOUNTER — Other Ambulatory Visit: Payer: Self-pay | Admitting: Nurse Practitioner

## 2024-04-23 ENCOUNTER — Encounter

## 2024-04-27 ENCOUNTER — Ambulatory Visit: Admitting: Podiatry

## 2024-05-01 ENCOUNTER — Ambulatory Visit: Admitting: Podiatry

## 2024-05-01 ENCOUNTER — Telehealth: Payer: Self-pay | Admitting: *Deleted

## 2024-05-01 ENCOUNTER — Ambulatory Visit

## 2024-05-01 ENCOUNTER — Encounter: Payer: Self-pay | Admitting: Podiatry

## 2024-05-01 DIAGNOSIS — M7751 Other enthesopathy of right foot: Secondary | ICD-10-CM

## 2024-05-01 DIAGNOSIS — M722 Plantar fascial fibromatosis: Secondary | ICD-10-CM

## 2024-05-01 MED ORDER — METHYLPREDNISOLONE 4 MG PO TBPK: ORAL_TABLET | ORAL | 0 refills | Status: AC

## 2024-05-01 NOTE — Telephone Encounter (Signed)
 I called and informed the patient that we will need xrays of her feet.  I asked her to arrive 20 minutes prior to her appointment and enter the building via the back entrance.  I asked her to stop by Imaging first, then they will direct her to us .

## 2024-05-01 NOTE — Progress Notes (Signed)
"  °  Subjective:  Patient ID: Nichole Green, female    DOB: [birth year],   MRN: [id number]  Chief Complaint  Patient presents with   Foot Pain    My second toe on my left foot hurts.  I have pain in the bottom of my left foot when I get up in the morning.  I can't stand on it for more than three hours at work.    44 y.o. female presents for concern of left second toe pain as well as pain in both of her heels.  She relates this has been ongoing for about a year.  It has slowly been worsening.  She has tried several inserts and good shoes and she continues to get pain.  She relates mostly pain with for steps in the morning or getting up from rest then she is okay for a while and then after being on her feet for a few hours she starts to get pain again.  She has tried meloxicam in the past and this has not helped.. Denies any other pedal complaints. Denies n/v/f/c.   Past Medical History:  Diagnosis Date   Headache    only when B/P elevated    Hypertension    Infection    UTI   Pregnancy induced hypertension     Objective:  Physical Exam: Vascular: DP/PT pulses 2/4 bilateral. CFT <3 seconds. Normal hair growth on digits. No edema.  Skin. No lacerations or abrasions bilateral feet.  Musculoskeletal: MMT 5/5 bilateral lower extremities in DF, PF, Inversion and Eversion. Deceased ROM in DF of ankle joint. Tender to the medial calcaneal tubercle bilaterally . No pain with achilles, PT or arch. No pain with calcaneal squeeze. Tender to second metatarsal phalangeal joint on the left.  Pain plantarly as well as pain with end range of motion particularly in dorsiflexion of the joint.  Some pain dorsally along the joint as well. Neurological: Sensation intact to light touch.   Assessment:   1. Plantar fasciitis of left foot   2. Plantar fasciitis, right   3. Capsulitis of toe of right foot      Plan:  Patient was evaluated and treated and all questions answered. Discussed plantar fasciitis with  patient.  X-rays reviewed and discussed with patient. No acute fractures or dislocations noted. Mild spurring noted at inferior calcaneus worse on left than right.  Discussed treatment options including, ice, NSAIDS, supportive shoes, bracing, and stretching. Stretching exercises provided to be done on a daily basis.   Prescription for medrol  provided and sent to pharmacy. PF brace dispensed bilateral.  Discussed capsulitis and inflammation of joint and treatment options with patient.  Discussed stiff sole shoes and recommend use of metatarsal padding.  Discussed taping and demonstrated taping of the toe.  Discussed if pain does not improve may consider injection, PT and/or MRI for further surgical planning.  Follow-up 6 weeks or sooner if any problems arise. In the meantime, encouraged to call the office with any questions, concerns, change in symptoms.     Asberry Failing, DPM    "

## 2024-05-01 NOTE — Patient Instructions (Signed)

## 2024-05-06 ENCOUNTER — Other Ambulatory Visit: Admitting: Nurse Practitioner

## 2024-05-06 ENCOUNTER — Other Ambulatory Visit: Payer: Self-pay

## 2024-06-12 ENCOUNTER — Ambulatory Visit: Admitting: Podiatry
